# Patient Record
Sex: Male | Born: 1935 | Race: Black or African American | Hispanic: No | Marital: Married | Smoking: Never smoker
Health system: Southern US, Community
[De-identification: ages and names within clinical notes are randomized; demographics above are authoritative.]

## PROBLEM LIST (undated history)

## (undated) DIAGNOSIS — N529 Male erectile dysfunction, unspecified: Secondary | ICD-10-CM

## (undated) DIAGNOSIS — K649 Unspecified hemorrhoids: Secondary | ICD-10-CM

## (undated) DIAGNOSIS — R300 Dysuria: Secondary | ICD-10-CM

## (undated) DIAGNOSIS — R9431 Abnormal electrocardiogram [ECG] [EKG]: Secondary | ICD-10-CM

## (undated) DIAGNOSIS — L989 Disorder of the skin and subcutaneous tissue, unspecified: Secondary | ICD-10-CM

## (undated) DIAGNOSIS — K259 Gastric ulcer, unspecified as acute or chronic, without hemorrhage or perforation: Secondary | ICD-10-CM

## (undated) DIAGNOSIS — C61 Malignant neoplasm of prostate: Secondary | ICD-10-CM

## (undated) DIAGNOSIS — N419 Inflammatory disease of prostate, unspecified: Secondary | ICD-10-CM

## (undated) DIAGNOSIS — E049 Nontoxic goiter, unspecified: Secondary | ICD-10-CM

## (undated) DIAGNOSIS — N400 Enlarged prostate without lower urinary tract symptoms: Secondary | ICD-10-CM

## (undated) DIAGNOSIS — R208 Other disturbances of skin sensation: Secondary | ICD-10-CM

## (undated) DIAGNOSIS — R972 Elevated prostate specific antigen [PSA]: Secondary | ICD-10-CM

## (undated) DIAGNOSIS — K6289 Other specified diseases of anus and rectum: Secondary | ICD-10-CM

## (undated) HISTORY — DX: Other disturbances of skin sensation: R20.8

## (undated) HISTORY — DX: Gastric ulcer, unspecified as acute or chronic, without hemorrhage or perforation: K25.9

## (undated) HISTORY — DX: Disorder of the skin and subcutaneous tissue, unspecified: L98.9

## (undated) HISTORY — PX: THYROIDECTOMY: SHX17

## (undated) HISTORY — DX: Dysuria: R30.0

## (undated) HISTORY — DX: Elevated prostate specific antigen (PSA): R97.20

## (undated) HISTORY — DX: Abnormal electrocardiogram (ECG) (EKG): R94.31

## (undated) HISTORY — DX: Malignant neoplasm of prostate: C61

## (undated) HISTORY — DX: Other specified diseases of anus and rectum: K62.89

## (undated) HISTORY — DX: Unspecified hemorrhoids: K64.9

## (undated) HISTORY — DX: Benign prostatic hyperplasia without lower urinary tract symptoms: N40.0

## (undated) HISTORY — DX: Male erectile dysfunction, unspecified: N52.9

## (undated) HISTORY — DX: Inflammatory disease of prostate, unspecified: N41.9

## (undated) HISTORY — DX: Nontoxic goiter, unspecified: E04.9

---

## 2004-10-25 ENCOUNTER — Emergency Department: Payer: Self-pay | Admitting: General Practice

## 2005-05-20 ENCOUNTER — Emergency Department: Payer: Self-pay | Admitting: Emergency Medicine

## 2005-06-22 ENCOUNTER — Ambulatory Visit: Payer: Self-pay | Admitting: Unknown Physician Specialty

## 2005-06-22 ENCOUNTER — Other Ambulatory Visit: Payer: Self-pay

## 2005-06-25 ENCOUNTER — Ambulatory Visit: Payer: Self-pay | Admitting: Unknown Physician Specialty

## 2005-06-26 ENCOUNTER — Emergency Department: Payer: Self-pay | Admitting: Emergency Medicine

## 2005-06-28 ENCOUNTER — Emergency Department: Payer: Self-pay | Admitting: Emergency Medicine

## 2005-09-25 ENCOUNTER — Emergency Department: Payer: Self-pay | Admitting: Unknown Physician Specialty

## 2005-09-25 ENCOUNTER — Other Ambulatory Visit: Payer: Self-pay

## 2006-01-07 ENCOUNTER — Ambulatory Visit: Payer: Self-pay | Admitting: Internal Medicine

## 2006-01-14 ENCOUNTER — Ambulatory Visit: Payer: Self-pay | Admitting: Internal Medicine

## 2006-02-11 ENCOUNTER — Ambulatory Visit: Payer: Self-pay | Admitting: Urology

## 2006-02-23 ENCOUNTER — Ambulatory Visit: Payer: Self-pay | Admitting: Urology

## 2006-02-23 HISTORY — PX: OTHER SURGICAL HISTORY: SHX169

## 2006-02-27 ENCOUNTER — Emergency Department: Payer: Self-pay | Admitting: Emergency Medicine

## 2006-03-23 ENCOUNTER — Ambulatory Visit: Payer: Self-pay

## 2006-04-01 ENCOUNTER — Ambulatory Visit: Payer: Self-pay | Admitting: Unknown Physician Specialty

## 2006-04-12 ENCOUNTER — Ambulatory Visit: Payer: Self-pay | Admitting: Unknown Physician Specialty

## 2006-08-31 ENCOUNTER — Ambulatory Visit: Payer: Self-pay | Admitting: Gastroenterology

## 2008-01-27 IMAGING — US US THYROID
1 series · 17 of 25 positions shown · non-contrast
Comparison: none

REASON FOR EXAM: RT thyroid lobe larger than LT on thyroid uptake and scan
COMMENTS:

[Series 1: us thyroid · 17 of 36 slices shown]
[im 1/36]
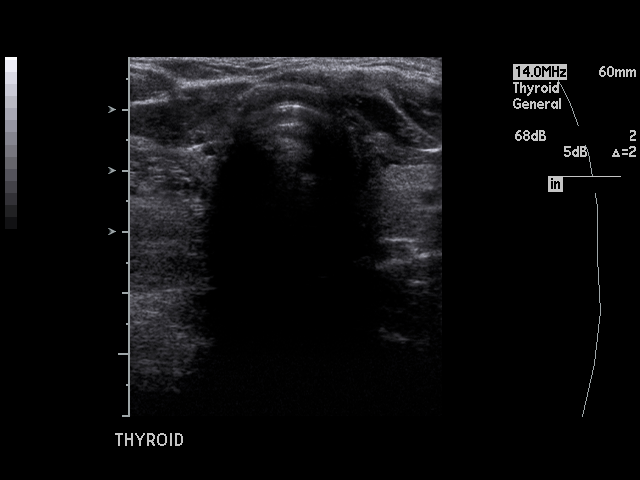
[im 3/36]
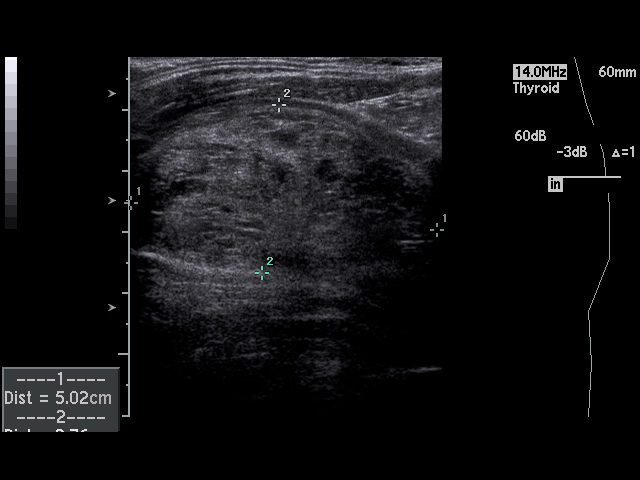
[im 5/36]
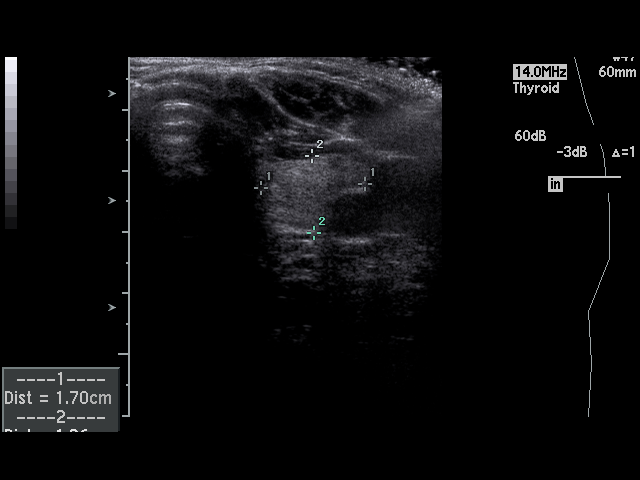
[im 8/36]
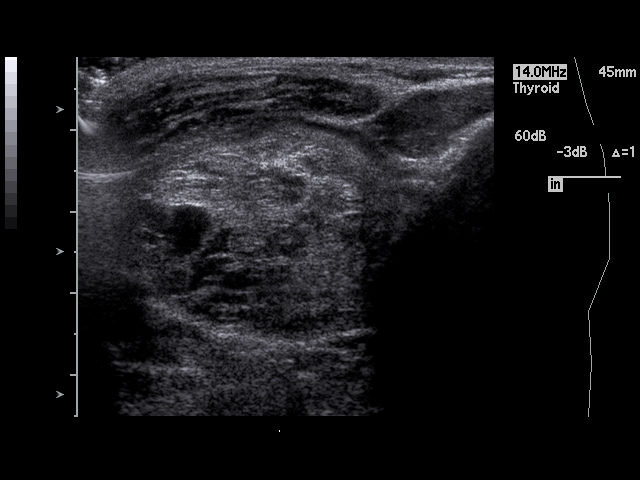
[im 9/36]
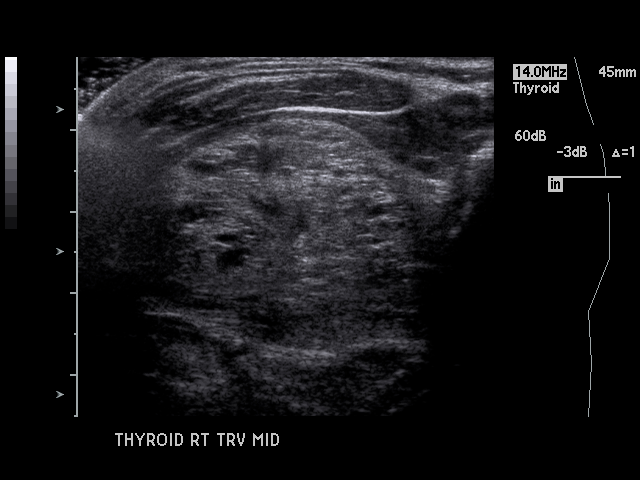
[im 12/36]
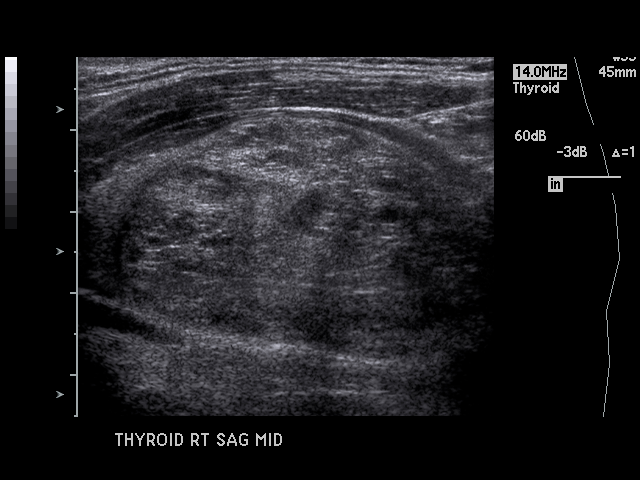
[im 14/36]
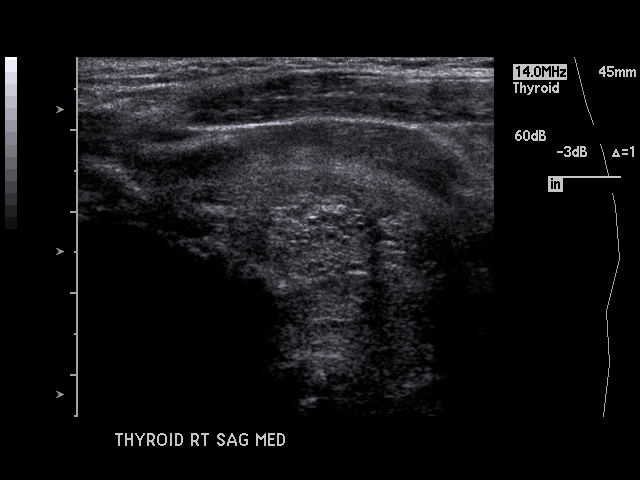
[im 17/36]
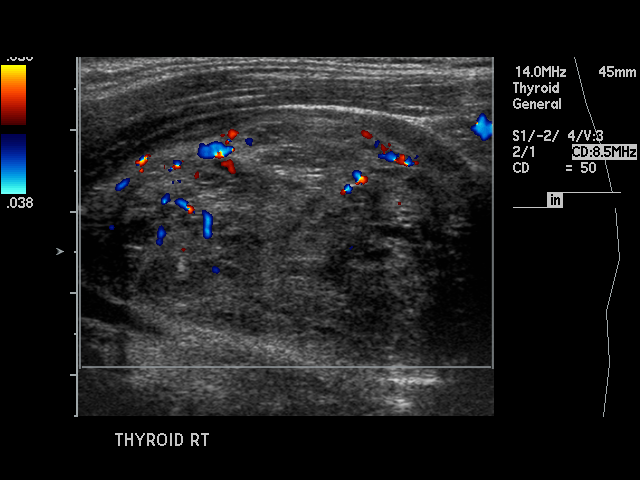
[im 18/36]
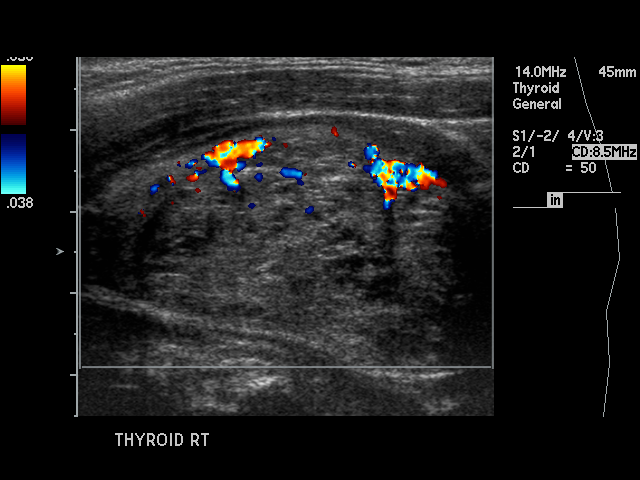
[im 19/36]
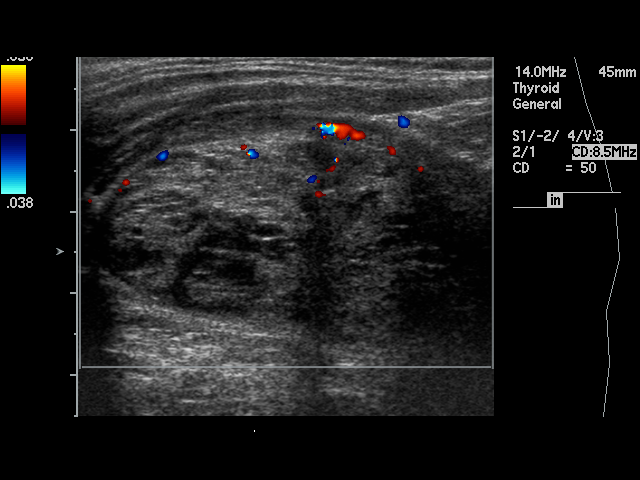
[im 22/36]
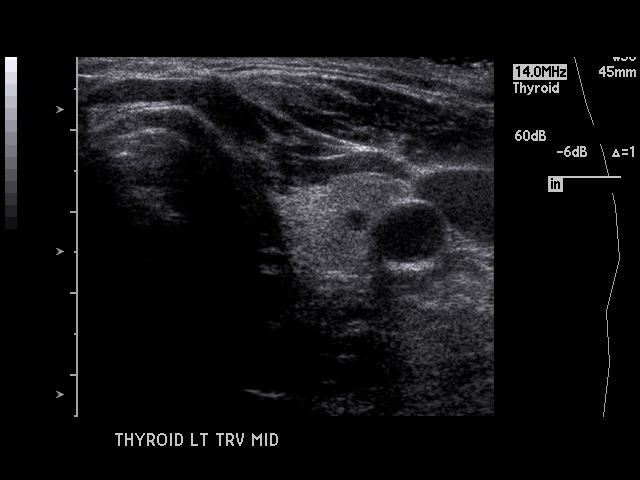
[im 24/36]
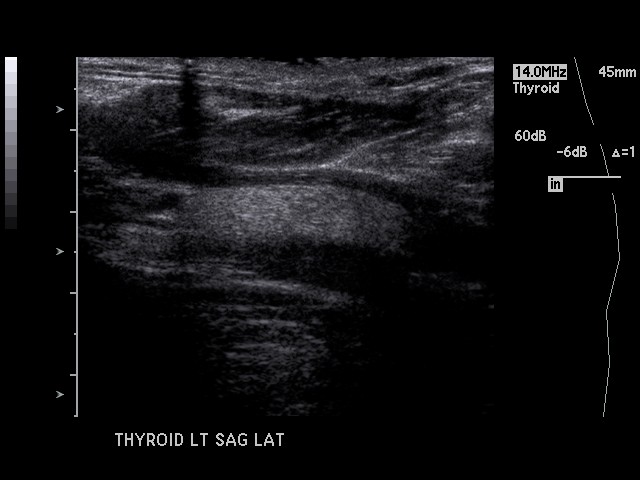
[im 27/36]
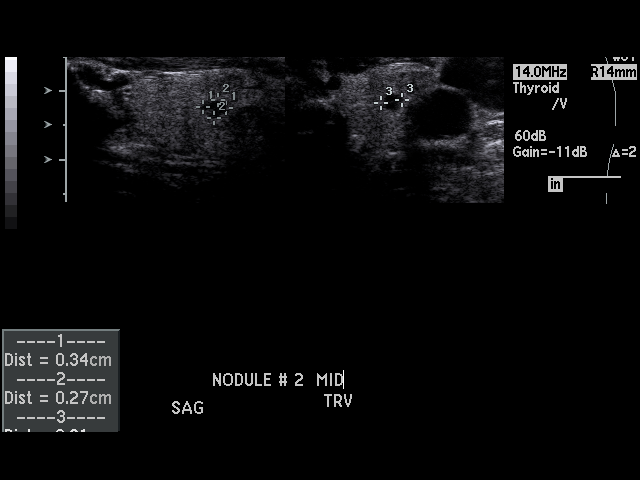
[im 28/36]
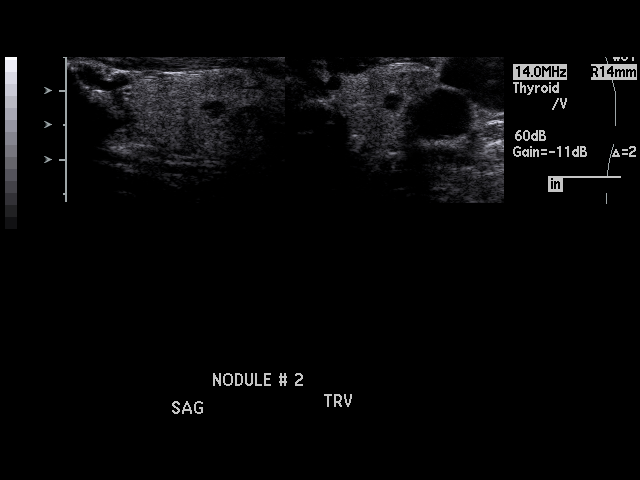
[im 31/36]
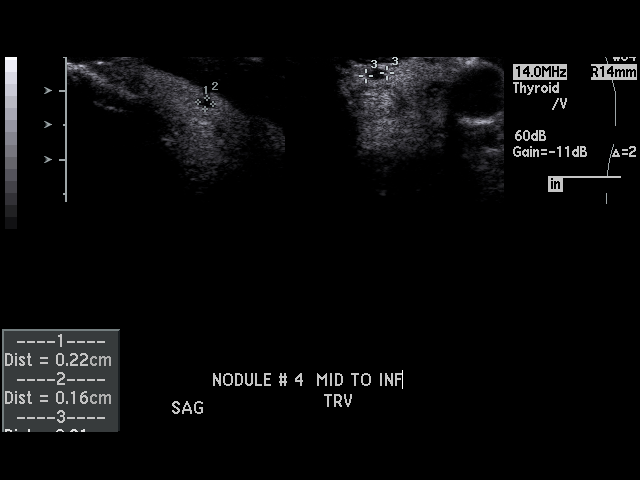
[im 33/36]
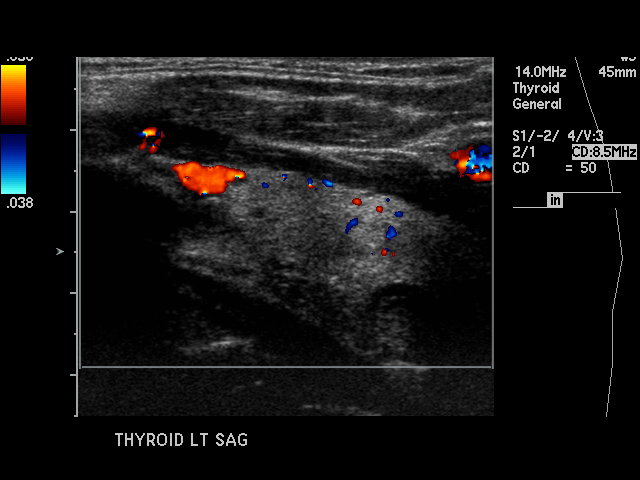
[im 36/36]
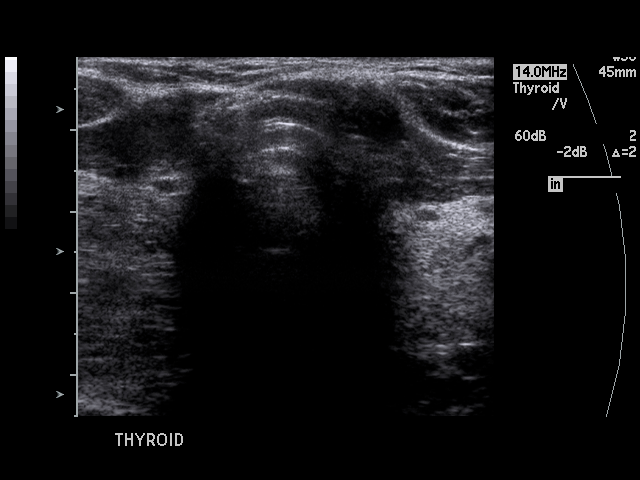

[17 of 25 positions shown; findings below may reference images not displayed]

PROCEDURE:     US  - US THYROID  - January 14, 2006  [DATE]

RESULT:          Sonographic evaluation of the thyroid gland shows a large
area of heterogeneity involving the RIGHT lobe which measures approximately
4.7 x 2.7 x 3.3 cm.  Multiple nodular areas are seen on the LEFT, with a mid
cystic nodule measuring 3.4 x 2.7 x 3.1 mm, and a mid to inferior complex
area measuring 4.1 x 3.5 x 3.4 mm, and a cystic mid to inferior lesion
measuring 2.2 x 1.6 x 3.1 cm.  The RIGHT lobe measures 5.0 x 2.7 x 3.6 cm,
and the LEFT lobe measures 4.3 x 1.3 x 1.7 cm.  The patient has already
undergone nuclear medicine scan.  According to the report, none of these
areas appear to be photopenic.  Tissue differentiation is difficult purely
sonographically.  Continued close correlation would be recommended.  If the
area continues to enlarge, consideration for biopsy would be recommended.
IMPRESSION: Please see above.

## 2008-02-16 ENCOUNTER — Ambulatory Visit: Payer: Self-pay | Admitting: Unknown Physician Specialty

## 2008-03-07 ENCOUNTER — Ambulatory Visit: Payer: Self-pay | Admitting: Gastroenterology

## 2008-08-07 ENCOUNTER — Ambulatory Visit: Payer: Self-pay | Admitting: Unknown Physician Specialty

## 2009-01-14 ENCOUNTER — Ambulatory Visit: Payer: Self-pay

## 2009-04-01 ENCOUNTER — Emergency Department: Payer: Self-pay | Admitting: Emergency Medicine

## 2010-01-17 ENCOUNTER — Ambulatory Visit: Payer: Self-pay | Admitting: Family Medicine

## 2010-08-14 ENCOUNTER — Ambulatory Visit: Payer: Self-pay | Admitting: Unknown Physician Specialty

## 2011-10-23 ENCOUNTER — Emergency Department: Payer: Self-pay | Admitting: Emergency Medicine

## 2012-03-02 ENCOUNTER — Ambulatory Visit: Payer: Self-pay | Admitting: Unknown Physician Specialty

## 2012-03-07 ENCOUNTER — Ambulatory Visit: Payer: Self-pay | Admitting: Gastroenterology

## 2012-09-05 ENCOUNTER — Ambulatory Visit: Payer: Self-pay | Admitting: Unknown Physician Specialty

## 2012-09-16 ENCOUNTER — Ambulatory Visit: Payer: Self-pay | Admitting: Unknown Physician Specialty

## 2013-05-05 ENCOUNTER — Inpatient Hospital Stay: Payer: Self-pay | Admitting: Internal Medicine

## 2013-05-05 LAB — TSH: Thyroid Stimulating Horm: 1.26 u[IU]/mL

## 2013-05-05 LAB — DRUG SCREEN, URINE
Amphetamines, Ur Screen: NEGATIVE (ref ?–1000)
Barbiturates, Ur Screen: NEGATIVE (ref ?–200)
Benzodiazepine, Ur Scrn: NEGATIVE (ref ?–200)
Cocaine Metabolite,Ur ~~LOC~~: NEGATIVE (ref ?–300)
MDMA (Ecstasy)Ur Screen: NEGATIVE (ref ?–500)
Opiate, Ur Screen: POSITIVE (ref ?–300)
Tricyclic, Ur Screen: NEGATIVE (ref ?–1000)

## 2013-05-05 LAB — CBC WITH DIFFERENTIAL/PLATELET
Basophil #: 0 10*3/uL (ref 0.0–0.1)
Basophil %: 0.2 %
Eosinophil %: 0.2 %
HCT: 47.4 % (ref 40.0–52.0)
HGB: 15.3 g/dL (ref 13.0–18.0)
Lymphocyte #: 1.7 10*3/uL (ref 1.0–3.6)
Monocyte %: 5.6 %
Neutrophil %: 81.3 %
RDW: 14.9 % — ABNORMAL HIGH (ref 11.5–14.5)
WBC: 13.3 10*3/uL — ABNORMAL HIGH (ref 3.8–10.6)

## 2013-05-05 LAB — COMPREHENSIVE METABOLIC PANEL
Albumin: 4.2 g/dL (ref 3.4–5.0)
BUN: 14 mg/dL (ref 7–18)
Bilirubin,Total: 0.5 mg/dL (ref 0.2–1.0)
Calcium, Total: 9.5 mg/dL (ref 8.5–10.1)
Chloride: 103 mmol/L (ref 98–107)
Creatinine: 1.28 mg/dL (ref 0.60–1.30)
Potassium: 3.3 mmol/L — ABNORMAL LOW (ref 3.5–5.1)
SGOT(AST): 23 U/L (ref 15–37)
SGPT (ALT): 39 U/L (ref 12–78)
Sodium: 141 mmol/L (ref 136–145)
Total Protein: 7.5 g/dL (ref 6.4–8.2)

## 2013-05-05 LAB — URINALYSIS, COMPLETE
Bacteria: NONE SEEN
Blood: NEGATIVE
Glucose,UR: 150 mg/dL (ref 0–75)
Nitrite: NEGATIVE
Ph: 6 (ref 4.5–8.0)
Protein: NEGATIVE
Squamous Epithelial: NONE SEEN

## 2013-05-05 LAB — TROPONIN I: Troponin-I: <0.02 ng/mL

## 2013-05-05 LAB — PRO B NATRIURETIC PEPTIDE: B-Type Natriuretic Peptide: 193 pg/mL (ref 0–450)

## 2013-05-06 LAB — CBC WITH DIFFERENTIAL/PLATELET
Eosinophil #: 0 10*3/uL (ref 0.0–0.7)
HCT: 43.6 % (ref 40.0–52.0)
HGB: 14.5 g/dL (ref 13.0–18.0)
Lymphocyte #: 0.9 10*3/uL — ABNORMAL LOW (ref 1.0–3.6)
MCH: 26.3 pg (ref 26.0–34.0)
MCHC: 33.3 g/dL (ref 32.0–36.0)
MCV: 79 fL — ABNORMAL LOW (ref 80–100)
Monocyte #: 0.4 x10 3/mm (ref 0.2–1.0)
Neutrophil #: 9.2 10*3/uL — ABNORMAL HIGH (ref 1.4–6.5)
Neutrophil %: 88.2 %
Platelet: 158 10*3/uL (ref 150–440)
RDW: 14.7 % — ABNORMAL HIGH (ref 11.5–14.5)
WBC: 10.4 10*3/uL (ref 3.8–10.6)

## 2013-05-06 LAB — BASIC METABOLIC PANEL
Anion Gap: 9 (ref 7–16)
Calcium, Total: 9 mg/dL (ref 8.5–10.1)
Chloride: 104 mmol/L (ref 98–107)
Co2: 28 mmol/L (ref 21–32)
Creatinine: 1.2 mg/dL (ref 0.60–1.30)
EGFR (African American): >60
Glucose: 139 mg/dL — ABNORMAL HIGH (ref 65–99)
Osmolality: 285 (ref 275–301)

## 2013-05-07 LAB — BASIC METABOLIC PANEL
BUN: 17 mg/dL (ref 7–18)
Calcium, Total: 8.3 mg/dL — ABNORMAL LOW (ref 8.5–10.1)
Co2: 31 mmol/L (ref 21–32)
Creatinine: 1.21 mg/dL (ref 0.60–1.30)
EGFR (Non-African Amer.): 56 — ABNORMAL LOW
Osmolality: 288 (ref 275–301)
Potassium: 3.8 mmol/L (ref 3.5–5.1)

## 2013-05-07 LAB — CBC WITH DIFFERENTIAL/PLATELET
Basophil #: 0 10*3/uL (ref 0.0–0.1)
Basophil %: 0.4 %
Eosinophil %: 1.2 %
Lymphocyte #: 1.7 10*3/uL (ref 1.0–3.6)
MCH: 26.8 pg (ref 26.0–34.0)
MCHC: 33.7 g/dL (ref 32.0–36.0)
MCV: 80 fL (ref 80–100)
Monocyte #: 0.5 x10 3/mm (ref 0.2–1.0)
RBC: 5.22 10*6/uL (ref 4.40–5.90)

## 2013-07-24 ENCOUNTER — Ambulatory Visit: Payer: Self-pay | Admitting: Unknown Physician Specialty

## 2013-11-04 IMAGING — CR DG CHEST 2V
1 series · 2 of 2 positions shown · non-contrast
Comparison: none

REASON FOR EXAM: coughing up blood
COMMENTS:   May transport without cardiac monitor

PROCEDURE:     DXR - DXR CHEST PA (OR AP) AND LATERAL  - October 23, 2011  [DATE]
RESULT:     The lung fields are clear. The heart, mediastinal and osseous
structures reveal no significant abnormalities.

[Series 1: w chest pa · 0.14mm/px · 2 of 2 slices shown]
[im 1/2]
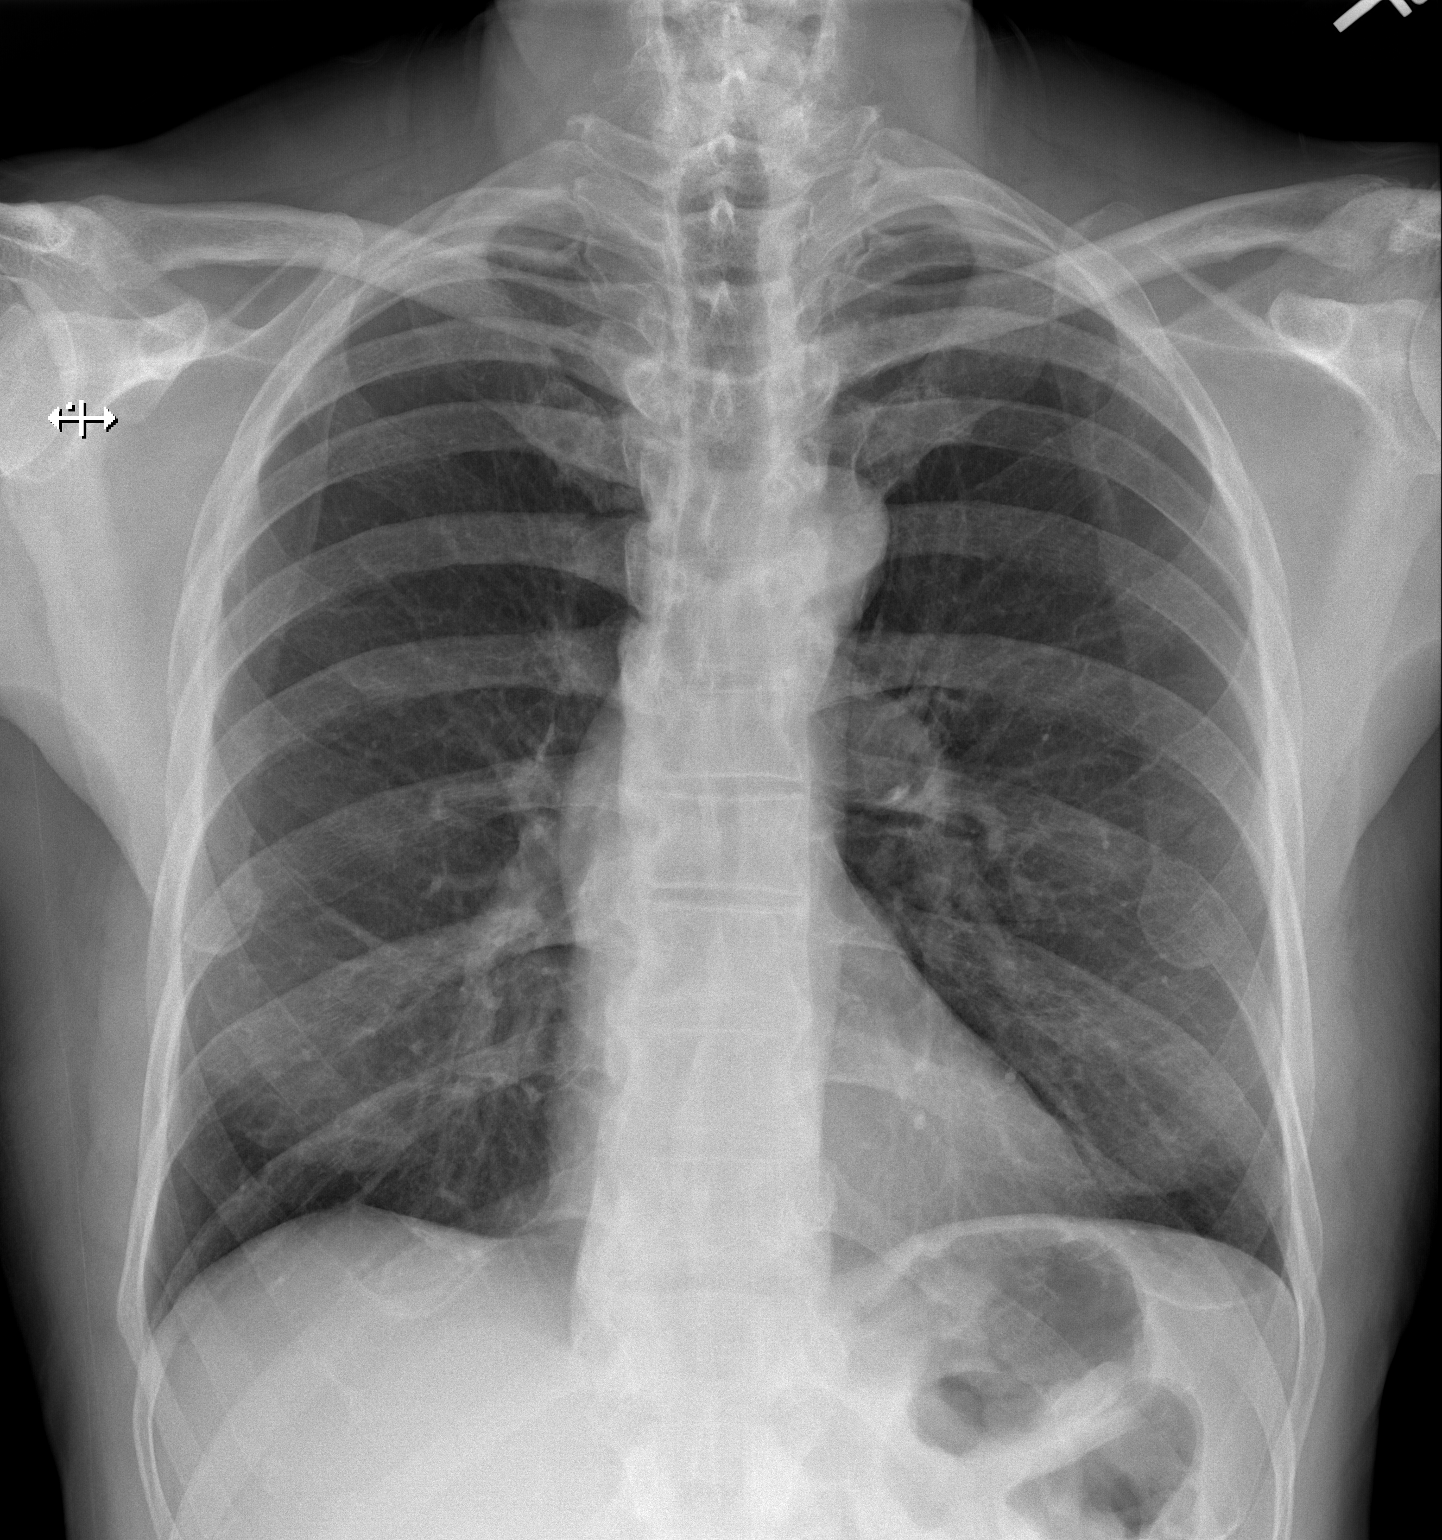
[im 2/2]
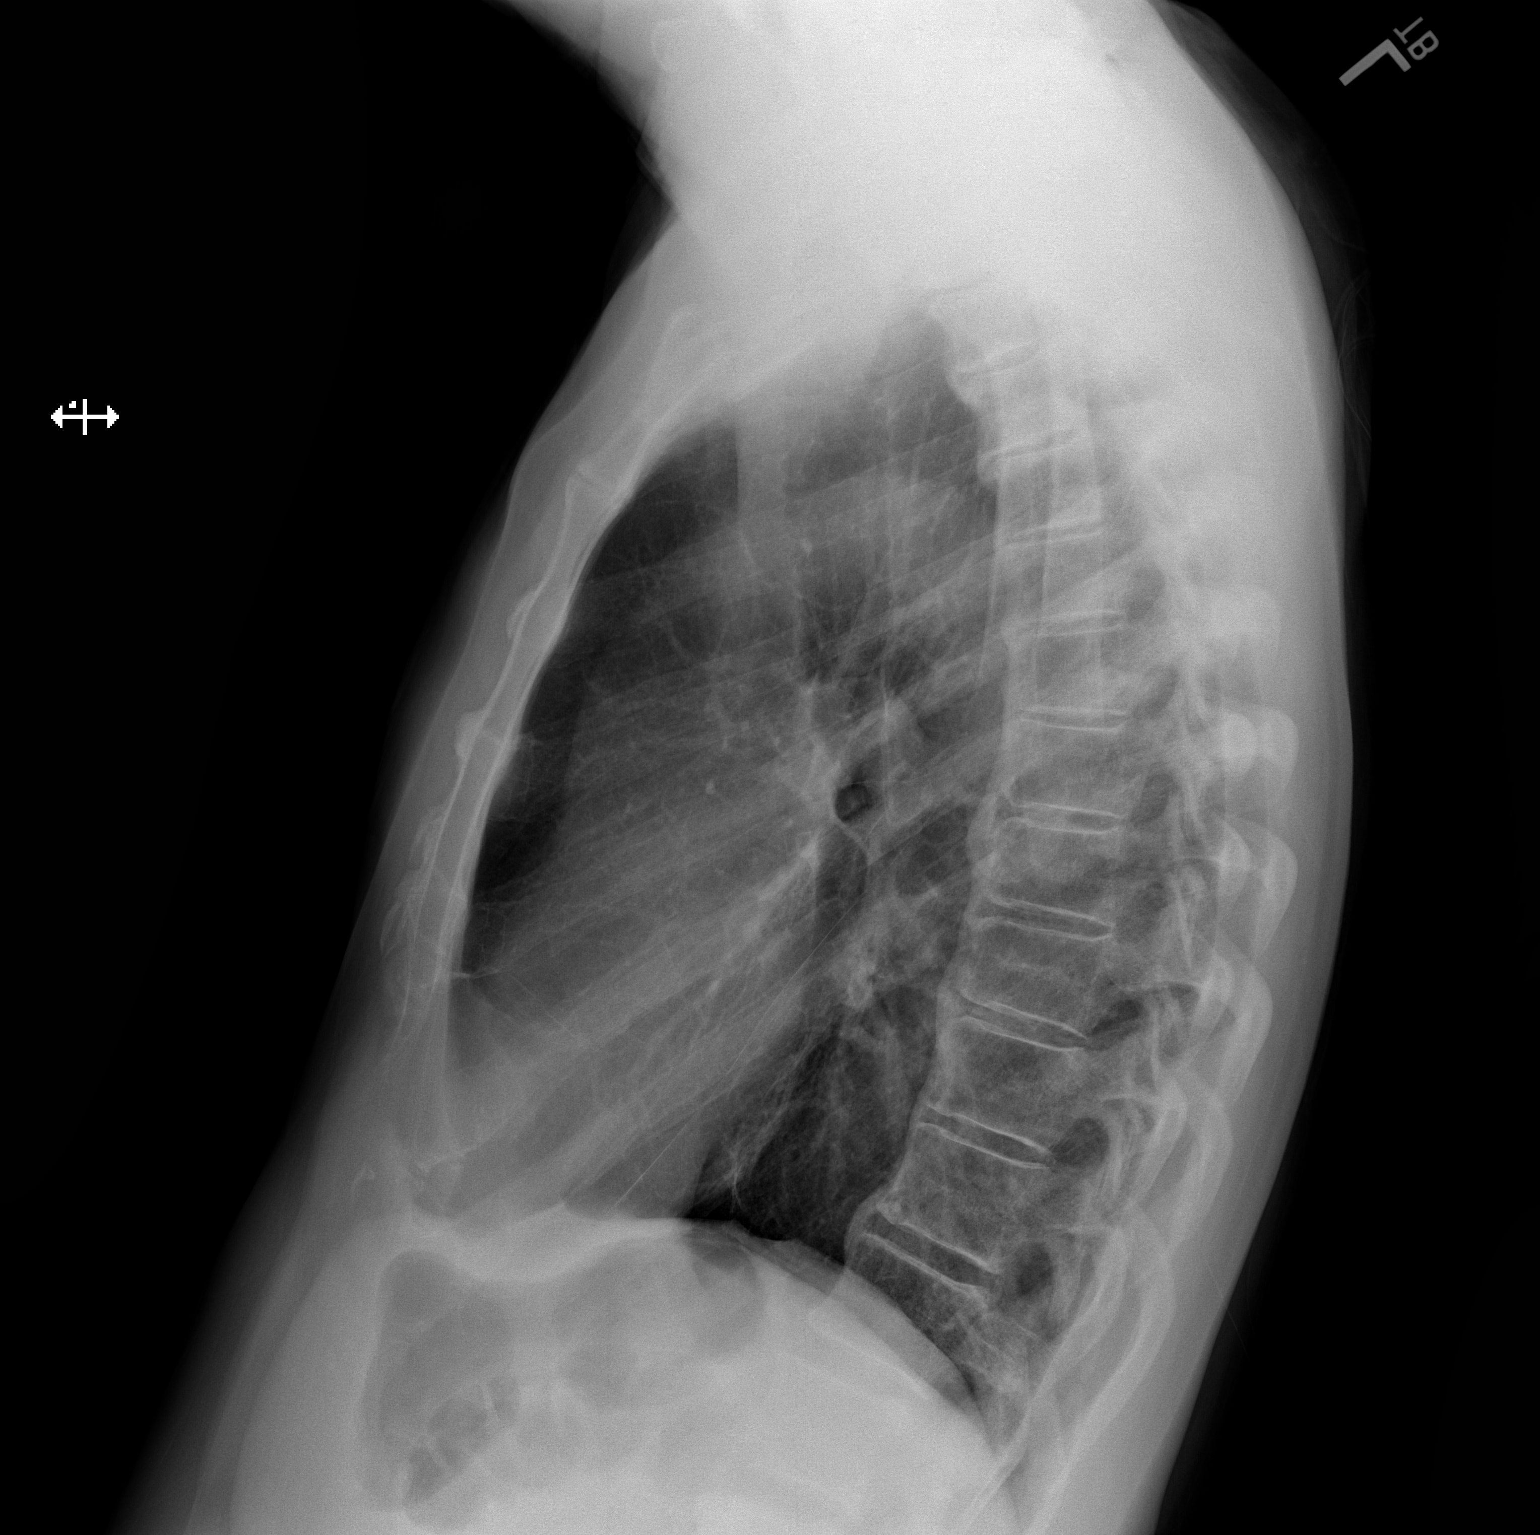

[2 of 2 positions shown; findings below may reference images not displayed]

IMPRESSION: No significant abnormalities are noted.

## 2014-04-25 DIAGNOSIS — I10 Essential (primary) hypertension: Secondary | ICD-10-CM | POA: Insufficient documentation

## 2014-08-21 DIAGNOSIS — Z8546 Personal history of malignant neoplasm of prostate: Secondary | ICD-10-CM | POA: Insufficient documentation

## 2014-08-21 DIAGNOSIS — Z8639 Personal history of other endocrine, nutritional and metabolic disease: Secondary | ICD-10-CM | POA: Insufficient documentation

## 2014-08-21 DIAGNOSIS — G629 Polyneuropathy, unspecified: Secondary | ICD-10-CM | POA: Insufficient documentation

## 2014-09-18 IMAGING — US THYROID ULTRASOUND
1 series · 13 of 25 positions shown · non-contrast
Comparison: none

REASON FOR EXAM: thyroid nodule compare to previous US
COMMENTS:

PROCEDURE:     SADE - SADE SOFT TISSUE HEAD/NECK/THYROI  - September 05, 2012 [DATE]
RESULT:     Comparison is made to prior study dated 03/02/2012.

[Series 1: thyroid ultrasound · 0.08mm/px · 13 of 64 slices shown]
[im 1/64]
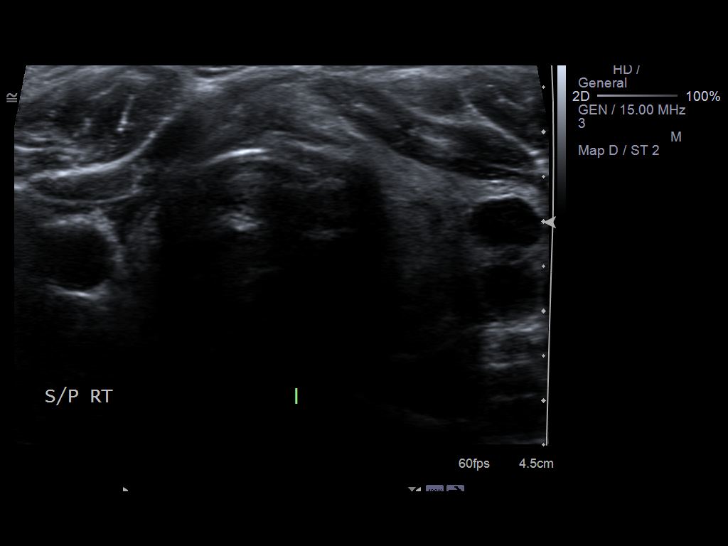
[im 6/64]
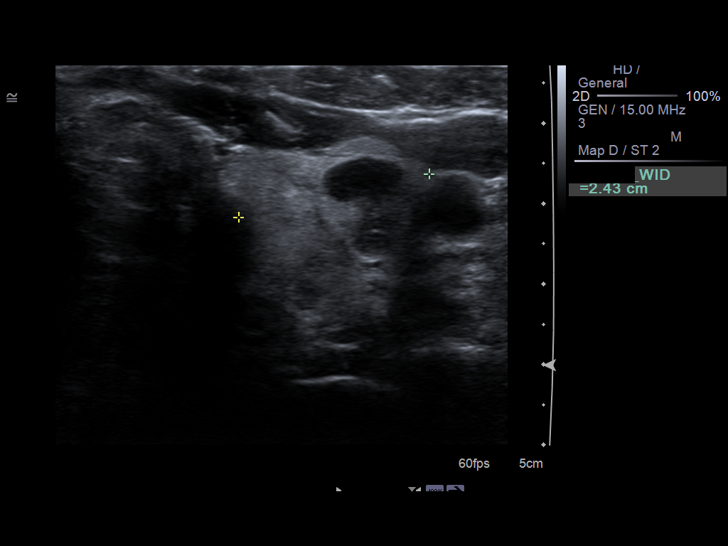
[im 11/64]
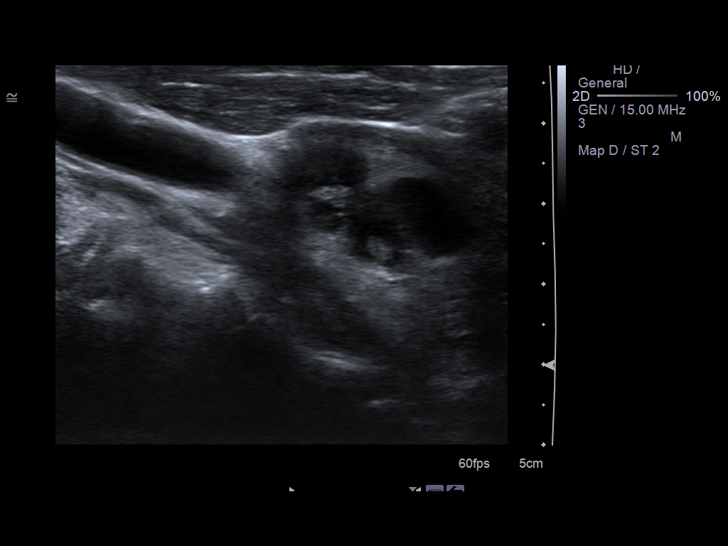
[im 16/64]
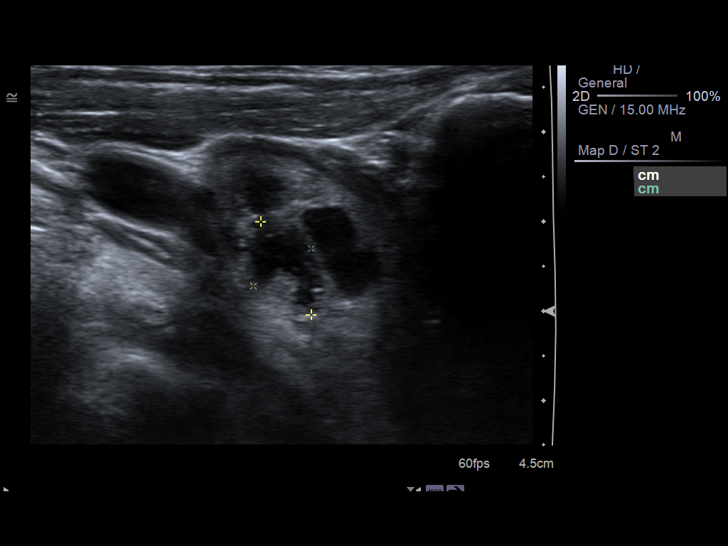
[im 22/64]
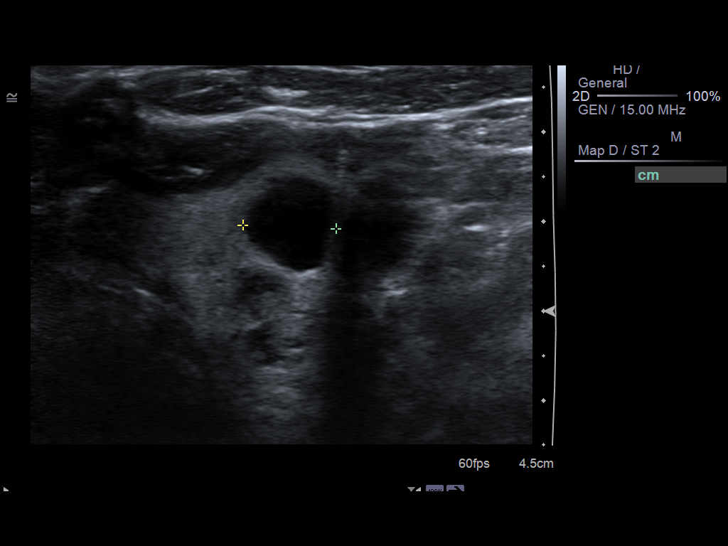
[im 27/64]
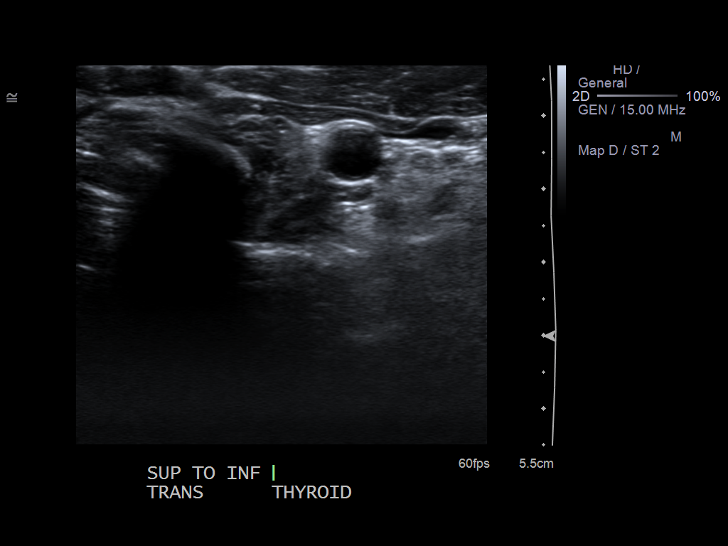
[im 32/64]
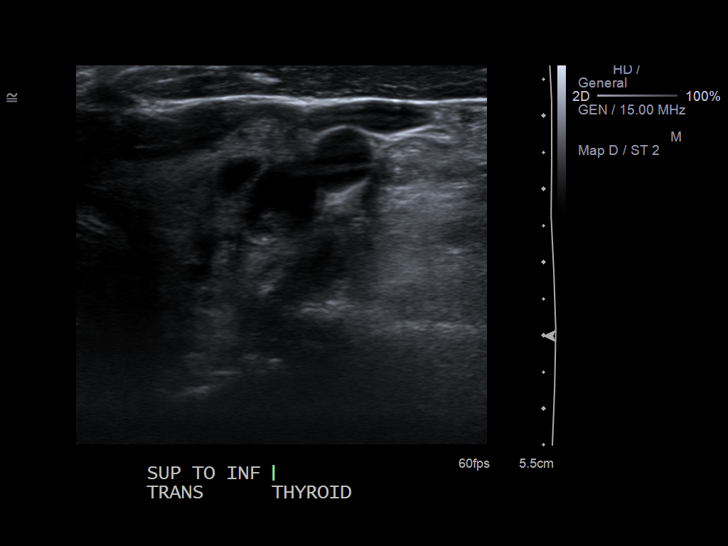
[im 37/64]
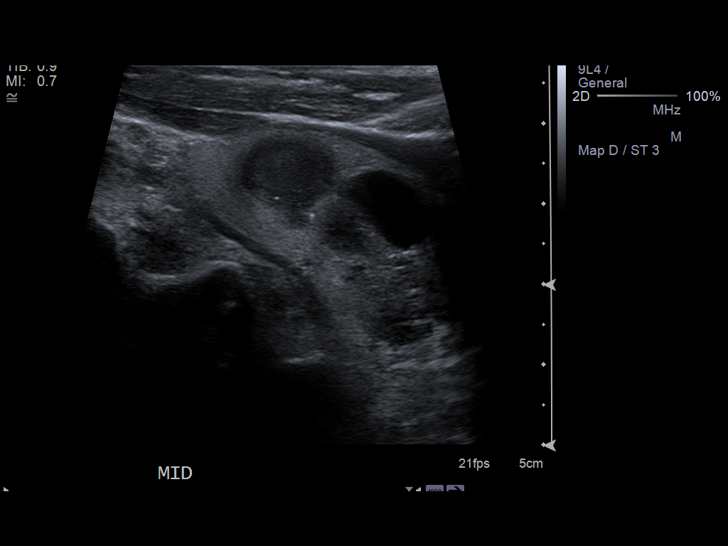
[im 43/64]
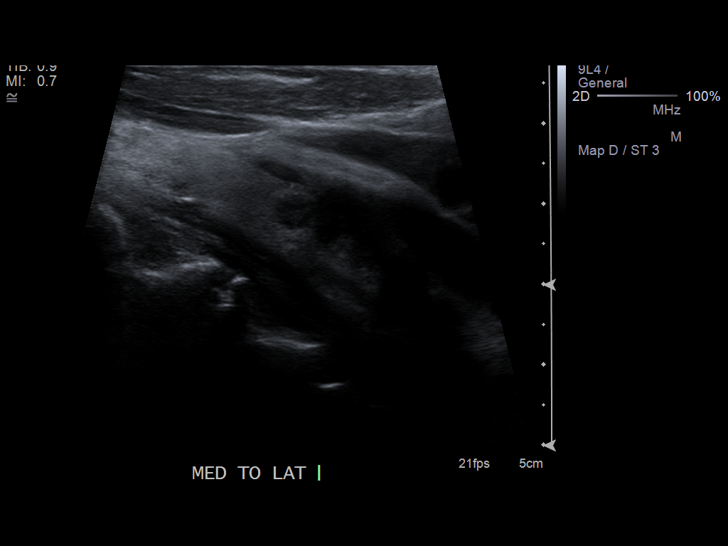
[im 48/64]
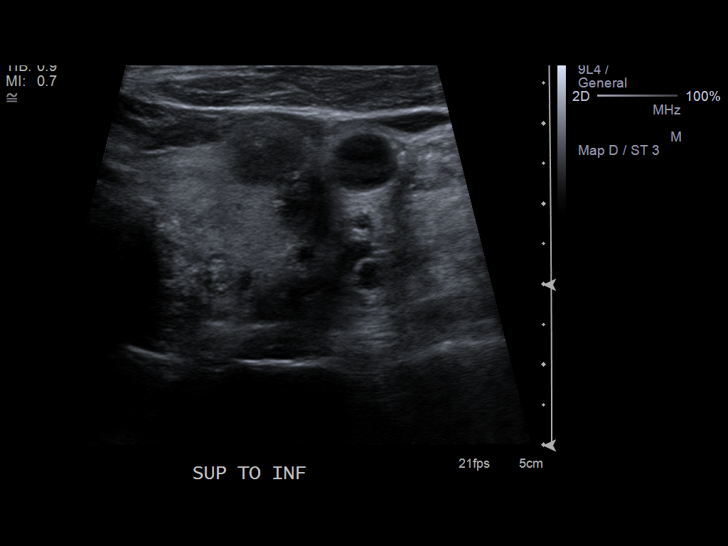
[im 53/64]
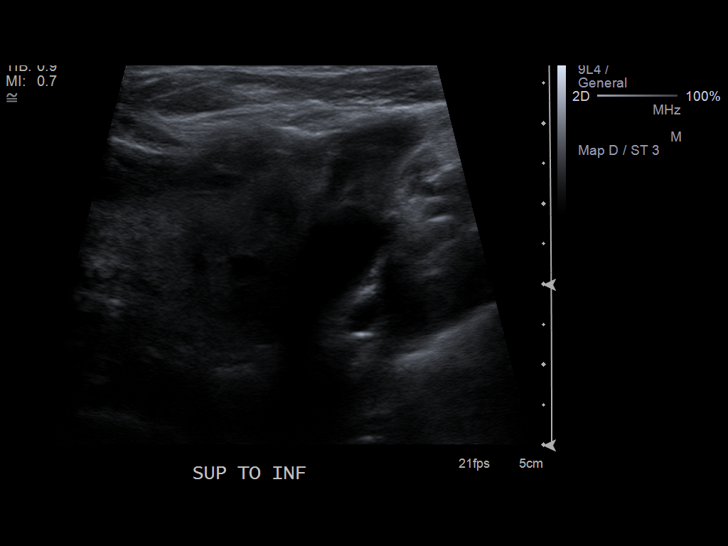
[im 58/64]
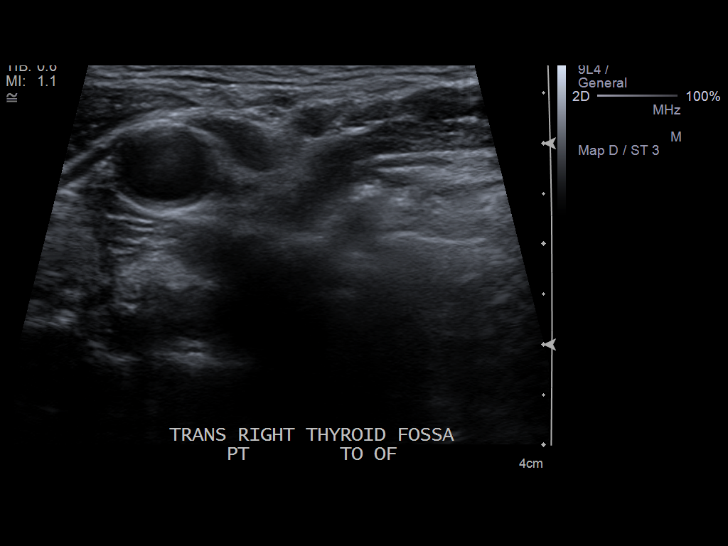
[im 64/64]
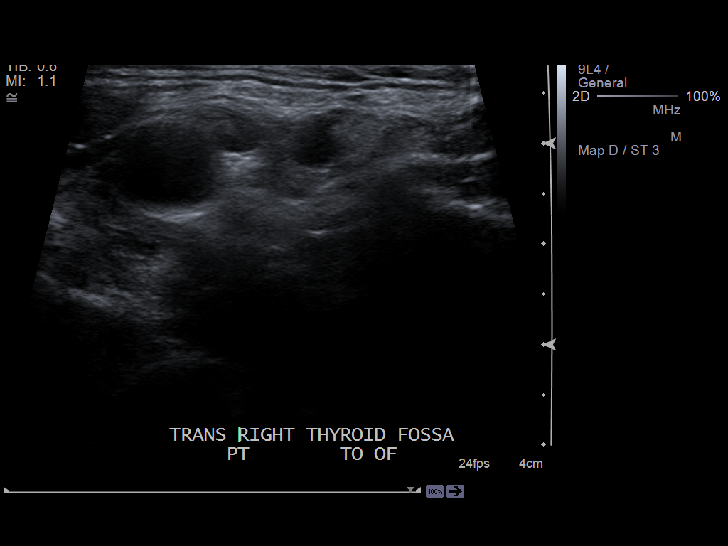

[13 of 25 positions shown; findings below may reference images not displayed]

FINDINGS: The right lobe of the thyroid is surgically absent. The isthmus
measures 4.1 mm in thickness and demonstrates a homogeneous echotexture.

Multiple nodules are appreciated within the left lobe of the thyroid. When
compared to the previous study there has been an increased number of
nodules. A debris-filled nodule is identified within the anterior aspect of
left lobe labeled #1 measuring 1.32 x 1.05 x 1.14 cm. Previously, this
nodule was primarily cystic. The nodule on the present examination is
heterogeneous and contains punctate calcifications. A second nodule is
identified just posterior and appears unchanged when compared to the
previous study and appears to be primarily cystic measuring 1.18 x 0.77 x
0.95 cm. The third nodule labeled #3 appears solid and measures 1.36 x
x 0.93 cm and is in the medial aspect of the left lobe of the thyroid. This
nodule appears to have increased in size when compared to the previous
study. Within the lower pole of left lobe of the thyroid there appears be a
nodule labeled
#4, which is primarily cystic measuring 1.34 x 0.95 x 1.05 cm. When
correlated with the previous study this nodule appears to have decreased in
size.
IMPRESSION: 1.     Nodule labeled #3 appears to have increased in size when compared to
the previous study. The nodule labeled #1 has developed a more solid
appearance and contains small punctate calculi. These findings are
indeterminate. Surveillance evaluation recommended if and as clinically
warranted or possibly tissue sampling.
2.     Nodule labeled #2 appears stable.

## 2015-02-20 ENCOUNTER — Ambulatory Visit
Admit: 2015-02-20 | Disposition: A | Discharge status: Home / Self Care | Payer: Self-pay | Attending: Unknown Physician Specialty | Admitting: Unknown Physician Specialty

## 2015-02-22 NOTE — H&P (Signed)
PATIENT NAME:  Ian Parker, Ian Parker MR#:  [id number] DATE OF BIRTH:  Apr 30, 1931  DATE OF ADMISSION:  05/05/2013  PRIMARY CARE PHYSICIAN:  Dr. Gilford Rile.   CHIEF COMPLAINT:  Confusion.   HISTORY OF PRESENTING ILLNESS:  An 81 year old African American male patient with history of internal hemorrhoids, hypertension, and BPH, who recently had hemorrhoid surgery 2 days back and was given a prescription of Vicodin, presents to the hospital, brought in by the wife with confusion. The patient and his family were out at a cookout. The patient went home to get some forgotten stuff and did not return. The patient's wife called, and then when she went home, the patient was sitting down, confused, and drowsy. The patient in the ER looked extremely sick, hypothermic at 95.6 with elevated white count of 13,000, needing a bear hugger, and is being admitted to the hospitalist service. The patient presently is a little more awake, although not being able to give much history. He does not complain of any pain. Most of the history obtained from old records with the ER physician, Dr. Cinda Quest, nursing staff, and family at bedside.   PAST MEDICAL HISTORY:  Hypertension, benign prostatic hypertrophy, internal hemorrhoids.   PAST SURGICAL HISTORY:  Hemorrhoid surgery.   SOCIAL HISTORY:  The patient lives at home, does not smoke, no alcohol, no illicit drugs. Drives and is independent with activities of daily living.   CODE STATUS:  FULL CODE.   FAMILY HISTORY:  Reviewed, but is unknown, as the patient is unable to answer questions.   HOME MEDICATIONS:  Include: 1.  Flomax 0.4 mg oral once a day.  2.  Vicodin 5/325 two tablets orally every 4 hours as needed for pain.  3.  Hydrochlorothiazide triamterene 50/75 one tablet oral once a day.  4.  Potassium chloride 20 mEq oral once a day.  5.  Amlodipine 5 mg oral once a day.   ALLERGIES:  ACE INHIBITORS, CIPROFLOXACIN, SEPTRA, and SULFA DRUGS and PEANUTS.   REVIEW OF SYSTEMS:   Unobtainable secondary to the patient's encephalopathy.   PHYSICAL EXAMINATION:  VITAL SIGNS:  Temperature of 95.8, and being on a bear hugger, presently is at 97, pulse of 87, respirations 18, blood pressure 121/76, saturating 99% on room air.  GENERAL:  Moderately built [place] American male patient lying in bed confused.  PSYCHIATRIC:  Alert, awake, but confused.  HEENT:  Atraumatic, normocephalic. Oral mucosa dry and pink. External ears and nose normal. No pallor. No icterus. Pupils bilaterally equal and reactive to light.  NECK: Supple. No thyromegaly. No palpable lymph nodes. Trachea midline. No carotid bruit or JVD.  CARDIOVASCULAR:  S1, S2, without any murmurs. Peripheral pulses 2+. No edema.  RESPIRATORY:  Normal work of breathing. Clear to auscultation on both sides.  GASTROINTESTINAL:  Soft abdomen, nontender. Bowel sounds present. No hepatosplenomegaly palpable.  SKIN:  Warm and dry. No petechiae, rash, ulcers.  MUSCULOSKELETAL:  No joint swelling, redness, effusion of the large joints. Normal muscle tone.  NEUROLOGICAL:  Motor strength, moves all 4 extremities symmetrically. Withdraws to pain. Babinski is downgoing.  RECTAL:  Exam deferred, but externally no signs of erythema, swelling, infection.   LABORATORY STUDIES:   1.  BNP of 193, glucose 235, BUN 14, creatinine 1.28, sodium 141, potassium 3.3, chloride 103, GFR 53. AST, ALT, alkaline phosphatase, bilirubin normal. Troponin less than 0.02. TSH of 1.26. Urine drug screen shows opiates positive. CBC shows WBC 13.3, hemoglobin 15.3, platelets of 164, neutrophils 81%. Urinalysis shows no bacteria.  2.  EKG shows sinus bradycardia at 59. No acute ST-T wave changes. 3.  Portable chest x-ray shows no acute disease.  4.  CT scan of the abdomen and pelvis with contrast shows a rectal thermometer. No acute findings in the abdomen or pelvis. There is a small defined focus of hyper enhancement in the right hepatic lobe, which likely  represents a perfusion anomaly similar to prior. A 7-mm calculus is seen in the left kidney.   ASSESSMENT AND PLAN:  1.  Sepsis with elevated white count and low body temperature. Unknown source. We will start the patient broadly with vancomycin and Zosyn. The patient had recent hemorrhoid surgery, could have had seeding from the site. We will get blood cultures and continue the antibiotic support with IV fluids. No other source for infection found.  2.  Acute encephalopathy could be secondary to sepsis or the new Vicodin started. We will also check a CT scan of the head to rule out any acute abnormalities.  3.  Hypertension. Continue medications.  4.  Hemorrhoids. Hemoglobin is stable, had recent surgery.  5.  Deep vein thrombosis prophylaxis with Lovenox.  6.  Code status: FULL CODE.   Time spent today on this critically ill case with hypothermia needing a bear hugger was 45 minutes.   ____________________________ Leia Alf Angelette Ganus, MD srs:ms D: 05/05/2013 21:17:21 ET T: 05/05/2013 21:42:05 ET JOB#: [id number]  cc: Alveta Heimlich R. Stephan Nelis, MD, <Dictator> John B. Sarina Ser, MD Alveta Heimlich Arlice Colt MD ELECTRONICALLY SIGNED 05/06/2013 13:27

## 2015-02-22 NOTE — Discharge Summary (Signed)
PATIENT NAME:  Ian Parker, Ian Parker MR#:  [id number] DATE OF BIRTH:  01-09-1931  DATE OF ADMISSION:  05/05/2013 DATE OF DISCHARGE:  05/07/2013  DIAGNOSES AT TIME OF DISCHARGE: 1.  Weakness and diaphoresis secondary to medications.  2.  Hypertension.  3.  History of benign prostatic hypertrophy.  4.  Recent hemorrhoid surgery.   CHIEF COMPLAINT: Confusion.   HISTORY OF PRESENT ILLNESS: Truitt Cruey is an 81 year old African American male with a history of recent hemorrhoid surgery, history of hypertension, benign prostatic hypertrophy, who was brought to the ER after he was noted by his wife to be confused and diaphoretic. The patient reportedly had gone to a cookout and subsequently had gone back home and when his wife called and went looking for him, the patient was reportedly confused, drowsy and sitting down. He was also noted to be hypotonic the ED with a temperature of 95.6, elevated white cell count of 13,000.   PAST MEDICAL HISTORY: Significant for hypertension, benign prostatic hypertrophy, internal hemorrhoids.   PHYSICAL EXAMINATION: VITAL SIGNS: Temperature 95.8, with bear hugger improved to 97, pulse 87, respirations 18, blood pressure 121/76, oxygen saturation 99% on room air.  GENERAL: He was somewhat confused initially but later improved.  HEENT: Normocephalic, atraumatic.  NECK: Supple.  HEART: S1, S2.  LUNGS: Clear to auscultation.  ABDOMEN: Soft, nontender.  EXTREMITIES: No edema.  NEUROLOGIC: Nonfocal.   LABORATORY, DIAGNOSTIC AND RADIOLOGIC DATA: BUN 14, creatinine 1.28, sodium 141, potassium 3.3, chloride 103. Liver function tests were normal. Troponin less than 0.02. TSH 1.26. Urine drug screen showed opiates positive. WBC count 13.3, hemoglobin 15.3, platelets 164, neutrophils 81.   EKG shows sinus bradycardia, no acute ST-T changes.   HOSPITAL COURSE: The patient was admitted to [facility] and was initially treated with IV antibiotics i.e. vancomycin and Zosyn. Blood  cultures were negative. His white cell count came back to normal. Chest x-ray did not show any infiltrate. Urinalysis was also normal. Symptomatically, the patient improved significantly. CT head did not show any acute intracranial findings. CT of the abdomen and pelvis showed no aggressive lytic or sclerotic osseous lesions. No acute findings were noted of the abdomen and pelvis. With intravenous hydration, the patient improved significantly. Stool cultures were also negative. Clostridium difficile was negative. His antibiotics were subsequently discontinued and he was allowed home in stable condition. He did have an episode of supraventricular tachycardia on the date of the discharge, but was asymptomatic. The patient was discharged in stable condition on the following medications:  Amlodipine 5 mg once a day, Klor-Con 20 mEq once a day, HCTZ/triamterene 50/75, 1 tablet a day, tamsulosin 0.4 mg once at bedtime.   DISCHARGE INSTRUCTIONS:  He has been advised to follow-up with Dr. Gilford Rile in 1 to 2 weeks' time. If he has any further episodes, he has been advised to call back or return to the ER. The patient was stable at the time of discharge.   TOTAL TIME SPENT: In discharge of this patient, 35 minutes   ____________________________ Tracie Harrier, MD vh:cc D: 05/07/2013 11:30:53 ET T: 05/07/2013 20:19:00 ET JOB#: [id number]  cc: Tracie Harrier, MD, <Dictator> Tracie Harrier MD ELECTRONICALLY SIGNED 05/25/2013 19:14

## 2015-03-13 ENCOUNTER — Encounter: Payer: Self-pay | Admitting: *Deleted

## 2015-03-14 ENCOUNTER — Ambulatory Visit: Payer: Medicare Other | Admitting: Anesthesiology

## 2015-03-14 ENCOUNTER — Encounter
Admission: RE | Disposition: A | Discharge status: Home / Self Care | Payer: Self-pay | Source: Ambulatory Visit | Attending: Gastroenterology

## 2015-03-14 ENCOUNTER — Ambulatory Visit
Admission: RE | Admit: 2015-03-14 | Discharge: 2015-03-14 | Disposition: A | Discharge status: Home / Self Care | Payer: Medicare Other | Source: Ambulatory Visit | Attending: Gastroenterology | Admitting: Gastroenterology

## 2015-03-14 ENCOUNTER — Encounter: Payer: Self-pay | Admitting: *Deleted

## 2015-03-14 DIAGNOSIS — R1310 Dysphagia, unspecified: Secondary | ICD-10-CM | POA: Diagnosis present

## 2015-03-14 DIAGNOSIS — K222 Esophageal obstruction: Secondary | ICD-10-CM | POA: Insufficient documentation

## 2015-03-14 DIAGNOSIS — Z8042 Family history of malignant neoplasm of prostate: Secondary | ICD-10-CM | POA: Insufficient documentation

## 2015-03-14 DIAGNOSIS — Z79899 Other long term (current) drug therapy: Secondary | ICD-10-CM | POA: Insufficient documentation

## 2015-03-14 DIAGNOSIS — Z8249 Family history of ischemic heart disease and other diseases of the circulatory system: Secondary | ICD-10-CM | POA: Insufficient documentation

## 2015-03-14 DIAGNOSIS — Z882 Allergy status to sulfonamides status: Secondary | ICD-10-CM | POA: Insufficient documentation

## 2015-03-14 HISTORY — PX: ESOPHAGOGASTRODUODENOSCOPY: SHX5428

## 2015-03-14 SURGERY — EGD (ESOPHAGOGASTRODUODENOSCOPY)
Anesthesia: Monitor Anesthesia Care

## 2015-03-14 MED ORDER — PROPOFOL INFUSION 10 MG/ML OPTIME
INTRAVENOUS | Status: DC | PRN
  Administered 2015-03-14: 200 ug/kg/min via INTRAVENOUS

## 2015-03-14 MED ORDER — LIDOCAINE HCL (PF) 1 % IJ SOLN
2.0000 mL | Freq: Once | INTRAMUSCULAR | Status: AC
  Administered 2015-03-14: 2 mL via INTRADERMAL

## 2015-03-14 MED ORDER — GLYCOPYRROLATE 0.2 MG/ML IJ SOLN
INTRAMUSCULAR | Status: DC | PRN
  Administered 2015-03-14: 0.2 mg via INTRAVENOUS

## 2015-03-14 MED ORDER — LIDOCAINE HCL (CARDIAC) 20 MG/ML IV SOLN
INTRAVENOUS | Status: DC | PRN
  Administered 2015-03-14: 60 mg via INTRAVENOUS

## 2015-03-14 MED ORDER — SODIUM CHLORIDE 0.9 % IV SOLN
INTRAVENOUS | Status: DC
Start: 2015-03-14 — End: 2015-03-14
  Administered 2015-03-14: 1000 mL via INTRAVENOUS

## 2015-03-14 MED ORDER — PROPOFOL 10 MG/ML IV BOLUS
INTRAVENOUS | Status: DC | PRN
  Administered 2015-03-14: 50 mg via INTRAVENOUS

## 2015-03-14 NOTE — Transfer of Care (Signed)
Immediate Anesthesia Transfer of Care Note  [name]  Procedure(s) Performed: Procedure(s): ESOPHAGOGASTRODUODENOSCOPY (EGD) (N/A)  Patient Location: Endoscopy Unit  Anesthesia Type:MAC  Level of Consciousness: sedated  Airway & Oxygen Therapy: Patient Spontanous Breathing and Patient connected to nasal cannula oxygen  Post-op Assessment: Report given to RN and Post -op Vital signs reviewed and stable  Post vital signs: stable  Last Vitals:  Filed Vitals:   03/14/15 1424  BP: 118/76  Pulse: 62  Temp: 36.4 C  Resp: 10    Complications: No apparent anesthesia complications

## 2015-03-14 NOTE — Anesthesia Preprocedure Evaluation (Addendum)
Anesthesia Evaluation  Patient identified by MRN, date of birth, ID band Patient awake    Reviewed: Allergy & Precautions, H&P , NPO status , Patient's Chart, lab work & pertinent test results, reviewed documented beta blocker date and time   Airway Mallampati: II  TM Distance: >3 FB Neck ROM: full    Dental no notable dental hx.    Pulmonary neg pulmonary ROS,  breath sounds clear to auscultation  Pulmonary exam normal       Cardiovascular Exercise Tolerance: Good hypertension, negative cardio ROS  Rhythm:regular Rate:Normal     Neuro/Psych negative neurological ROS  negative psych ROS   GI/Hepatic negative GI ROS, Neg liver ROS,   Endo/Other  negative endocrine ROS  Renal/GU negative Renal ROS  negative genitourinary   Musculoskeletal   Abdominal   Peds  Hematology negative hematology ROS (+)   Anesthesia Other Findings   Reproductive/Obstetrics negative OB ROS                            Anesthesia Physical Anesthesia Plan  ASA: II  Anesthesia Plan: MAC   Post-op Pain Management:    Induction:   Airway Management Planned:   Additional Equipment:   Intra-op Plan:   Post-operative Plan:   Informed Consent: I have reviewed the patients History and Physical, chart, labs and discussed the procedure including the risks, benefits and alternatives for the proposed anesthesia with the patient or authorized representative who has indicated his/her understanding and acceptance.   Dental Advisory Given and Dental advisory given  Plan Discussed with: CRNA  Anesthesia Plan Comments:        Anesthesia Quick Evaluation

## 2015-03-14 NOTE — Op Note (Signed)
[facility] Patient Name: Ian Parker Procedure Date: 03/14/2015 2:04 PM MRN: [id number] Account #: [id number] Date of Birth: 03/19/1931 Admit Type: Outpatient Age: 81 Room: [facility] ENDO ROOM 4 Gender: Male Note Status: Finalized Procedure:         Upper GI endoscopy Indications:       Dysphagia, Abnormal abdominal x-ray of the GI tract Providers:         Lupita Dawn. Candace Cruise, MD Referring MD:      Hewitt Blade. Sarina Ser, MD (Referring MD), Roena Malady,                     MD (Referring MD) Medicines:         Monitored Anesthesia Care Complications:     No immediate complications. Procedure:         Pre-Anesthesia Assessment:                    - Prior to the procedure, a History and Physical was                     performed, and patient medications, allergies and                     sensitivities were reviewed. The patient's tolerance of                     previous anesthesia was reviewed.                    - The risks and benefits of the procedure and the sedation                     options and risks were discussed with the patient. All                     questions were answered and informed consent was obtained.                    - After reviewing the risks and benefits, the patient was                     deemed in satisfactory condition to undergo the procedure.                    After obtaining informed consent, the endoscope was passed                     under direct vision. Throughout the procedure, the                     patient's blood pressure, pulse, and oxygen saturations                     were monitored continuously. The Endoscope was introduced                     through the mouth, and advanced to the second part of                     duodenum. The upper GI endoscopy was accomplished without                     difficulty. The patient tolerated the procedure well. The  upper GI endoscopy was accomplished  without difficulty.                     The patient tolerated the procedure well. Findings:      A benign-appearing, intrinsic mild stenosis was found at the       gastroesophageal junction. The scope was withdrawn. Dilation was       performed with a Maloney dilator with mild resistance at 56 Fr.      The exam was otherwise without abnormality.      The entire examined stomach was normal.      The examined duodenum was normal. Impression:        - Benign-appearing esophageal stricture. Dilated.                    - The examination was otherwise normal.                    - Normal stomach.                    - Normal examined duodenum.                    - No specimens collected. Recommendation:    - Discharge patient to home.                    - Observe patient's clinical course.                    - Continue present medications.                    - The findings and recommendations were discussed with the                     patient. Procedure Code(s): --- Professional ---                    [phone], Esophagogastroduodenoscopy, flexible, transoral;                     diagnostic, including collection of specimen(s) by                     brushing or washing, when performed (separate procedure)                    43450, Dilation of esophagus, by unguided sound or bougie,                     single or multiple passes Diagnosis Code(s): --- Professional ---                    K22.2, Esophageal obstruction                    R13.10, Dysphagia, unspecified                    R93.3, Abnormal findings on diagnostic imaging of other                     parts of digestive tract CPT copyright 2014 [employer]. All rights reserved. The codes documented in this report are preliminary and upon coder review may  be revised to meet current compliance requirements. Hulen Luster, MD 03/14/2015 2:18:27 PM This report has been signed electronically. Number  of Addenda: 0 Note Initiated  On: 03/14/2015 2:04 PM      [facility]

## 2015-03-14 NOTE — Anesthesia Postprocedure Evaluation (Signed)
  Anesthesia Post-op Note  [name]  Procedure(s) Performed: Procedure(s): ESOPHAGOGASTRODUODENOSCOPY (EGD) (N/A)  Anesthesia type:MAC  Patient location: PACU  Post pain: Pain level controlled  Post assessment: Post-op Vital signs reviewed, Patient's Cardiovascular Status Stable, Respiratory Function Stable, Patent Airway and No signs of Nausea or vomiting  Post vital signs: Reviewed and stable  Last Vitals:  Filed Vitals:   03/14/15 1424  BP: 118/76  Pulse: 62  Temp: 36.4 C  Resp: 10    Level of consciousness: awake, alert  and patient cooperative  Complications: No apparent anesthesia complications

## 2015-03-14 NOTE — Transfer of Care (Deleted)
Immediate Anesthesia Transfer of Care Note  [name]  Procedure(s) Performed: Procedure(s): ESOPHAGOGASTRODUODENOSCOPY (EGD) (N/A)  Patient Location: Endoscopy Unit  Anesthesia Type:General  Level of Consciousness: sedated  Airway & Oxygen Therapy: Patient Spontanous Breathing and Patient connected to nasal cannula oxygen  Post-op Assessment: Report given to RN and Post -op Vital signs reviewed and stable  Post vital signs: stable  Last Vitals:  Filed Vitals:   03/14/15 1314  BP: 110/52  Pulse: 44  Temp: 35.9 C  Resp: 14    Complications: No apparent anesthesia complications

## 2015-03-14 NOTE — H&P (Signed)
  Date of Initial H&P: 02/27/2015  History reviewed, patient examined, no change in status, stable for surgery.

## 2015-03-15 ENCOUNTER — Encounter: Payer: Self-pay | Admitting: Gastroenterology

## 2015-06-18 ENCOUNTER — Encounter: Payer: Self-pay | Admitting: *Deleted

## 2015-06-18 ENCOUNTER — Other Ambulatory Visit: Payer: Self-pay | Admitting: *Deleted

## 2015-06-26 ENCOUNTER — Ambulatory Visit (INDEPENDENT_AMBULATORY_CARE_PROVIDER_SITE_OTHER): Payer: Medicare Other | Admitting: Urology

## 2015-06-26 ENCOUNTER — Encounter: Payer: Self-pay | Admitting: Urology

## 2015-06-26 VITALS — BP 139/80 | HR 68 | Resp 18 | Ht 70.0 in | Wt 158.7 lb

## 2015-06-26 DIAGNOSIS — C61 Malignant neoplasm of prostate: Secondary | ICD-10-CM | POA: Diagnosis not present

## 2015-06-26 DIAGNOSIS — N528 Other male erectile dysfunction: Secondary | ICD-10-CM | POA: Diagnosis not present

## 2015-06-26 DIAGNOSIS — K649 Unspecified hemorrhoids: Secondary | ICD-10-CM

## 2015-06-26 DIAGNOSIS — N529 Male erectile dysfunction, unspecified: Secondary | ICD-10-CM

## 2015-06-26 DIAGNOSIS — N401 Enlarged prostate with lower urinary tract symptoms: Secondary | ICD-10-CM | POA: Insufficient documentation

## 2015-06-26 DIAGNOSIS — N138 Other obstructive and reflux uropathy: Secondary | ICD-10-CM

## 2015-06-26 LAB — BLADDER SCAN AMB NON-IMAGING

## 2015-06-26 MED ORDER — TADALAFIL 20 MG PO TABS: 20.0000 mg | ORAL_TABLET | Freq: Every day | ORAL | Status: DC | PRN

## 2015-06-26 MED ORDER — HYDROCORTISONE 2.5 % RE CREA: 1.0000 "application " | TOPICAL_CREAM | Freq: Two times a day (BID) | RECTAL | Status: DC

## 2015-06-26 NOTE — Progress Notes (Signed)
06/26/2015 11:09 AM   Ian Parker 03-04-1931 [id number]  Referring provider: Madelyn Brunner, MD [facility] [place], [place] [address]  Chief Complaint  Patient presents with  . Prostate Cancer  . Elevated PSA  . Follow-up    1 year f/u    HPI: Ian Parker is an 81 year old African-American male with a history of prostate cancer, erectile dysfunction and BPH with LUTS who presents today for a one year follow up.  Prostate cancer Patient underwent cryoablation of his prostate cancer in 2007. His PSA at the time of diagnosis was 5.3 ng/mL. I do not have his biopsy pathology available to me.   His most recent PSA was 0.3 ng/mL on 06/25/2014.     Erectile dysfunction Patient's erectile dysfunction have been managed with tri-mix injections in the past with good success. Most recently, he's had difficulty injecting himself with the medication and his wife is unable to inject the medication. He would like to retry PD 5 inhibitors at this time.  His SHIM score today is 5, which is severe ED.   BPH WITH LUTS His IPSS score today is 7, which is mild lower urinary tract symptomatology. He is mixed with his quality life due to his urinary symptoms. His PVR is 58  mL.     His major complaint today is urgency.  He has had these symptoms for many years.  He denies any dysuria, hematuria or suprapubic pain.   He currently taking tamsulosin.  His has had cystoscopic examintation in 2015 which demonstrated mild middle lobe enlargement.    He also denies any recent fevers, chills, nausea or vomiting.  He has a family history of PCa, with his brother also being diagnosed with PCa.      IPSS      06/26/15 1100       International Prostate Symptom Score   How often have you had the sensation of not emptying your bladder? Not at All     How often have you had to urinate less than every two hours? Less than 1 in 5 times     How often have you found you stopped and started again  several times when you urinated? Not at All     How often have you found it difficult to postpone urination? About half the time     How often have you had a weak urinary stream? Less than half the time     How often have you had to strain to start urination? Not at All     How many times did you typically get up at night to urinate? 1 Time     Total IPSS Score 7     Quality of Life due to urinary symptoms   If you were to spend the rest of your life with your urinary condition just the way it is now how would you feel about that? Mixed        Score:  1-7 Mild 8-19 Moderate 20-35 Severe      PMH: Past Medical History  Diagnosis Date  . Prostate cancer   . Dysuria   . Rectal burning   . Abnormal EKG   . BPH (benign prostatic hyperplasia)   . Gastric ulcer   . Abnormal PSA   . Hemorrhoid   . Goiter   . Erectile dysfunction   . Inflammatory disease of prostate   . Skin lesion   . Irritation of perirectal skin  Surgical History: Past Surgical History  Procedure Laterality Date  . Esophagogastroduodenoscopy N/A 03/14/2015    Procedure: ESOPHAGOGASTRODUODENOSCOPY (EGD);  Surgeon: Hulen Luster, MD;  Location: [facility] ENDOSCOPY;  Service: Gastroenterology;  Laterality: N/A;  . Cryoablation of prostate  02/23/2006  . Thyroidectomy      Subtotal    Home Medications:    Medication List       This list is accurate as of: 06/26/15 11:09 AM.  Always use your most recent med list.               amLODipine 5 MG tablet  Commonly known as:  NORVASC  Take 5 mg by mouth daily.     cholecalciferol 1000 UNITS tablet  Commonly known as:  VITAMIN D  Take 1,000 Units by mouth daily. Two tablets once daily     cyanocobalamin 1000 MCG/ML injection  Commonly known as:  (VITAMIN B-12)  Inject into the muscle.     lidocaine 2 % jelly  Commonly known as:  XYLOCAINE  Apply topically.     potassium chloride SA 20 MEQ tablet  Commonly known as:  K-DUR,KLOR-CON  Take 20 mEq by  mouth daily.     psyllium 58.6 % powder  Commonly known as:  METAMUCIL  Take 1 packet by mouth 2 (two) times daily.     tamsulosin 0.4 MG Caps capsule  Commonly known as:  FLOMAX  Take 0.4 mg by mouth daily.     triamterene-hydrochlorothiazide 75-50 MG per tablet  Commonly known as:  MAXZIDE  Take 1 tablet by mouth daily.        Allergies:  Allergies  Allergen Reactions  . Ace Inhibitors   . Peanuts [Peanut Oil]   . Penicillins   . Sulfa Antibiotics     Family History: Family History  Problem Relation Age of Onset  . Prostate cancer Brother   . Brain cancer Father     tumor unknown if cancer  . Heart attack Mother   . Heart attack Sister     Social History:  reports that he has never smoked. He does not have any smokeless tobacco history on file. He reports that he does not drink alcohol. His drug history is not on file.  ROS: UROLOGY Frequent Urination?: No Hard to postpone urination?: No Burning/pain with urination?: No Get up at night to urinate?: No Leakage of urine?: No Urine stream starts and stops?: No Trouble starting stream?: No Do you have to strain to urinate?: No Blood in urine?: No Urinary tract infection?: No Sexually transmitted disease?: No Injury to kidneys or bladder?: No Painful intercourse?: No Weak stream?: No Erection problems?: Yes Penile pain?: No  Gastrointestinal Nausea?: No Vomiting?: No Indigestion/heartburn?: No Diarrhea?: No Constipation?: No  Constitutional Fever: No Night sweats?: No Weight loss?: No Fatigue?: No  Skin Skin rash/lesions?: No Itching?: No  Eyes Blurred vision?: No Double vision?: No  Ears/Nose/Throat Sore throat?: No Sinus problems?: No  Hematologic/Lymphatic Swollen glands?: No Easy bruising?: No  Cardiovascular Leg swelling?: No Chest pain?: No  Respiratory Cough?: No Shortness of breath?: No  Endocrine Excessive thirst?: No  Musculoskeletal Back pain?: No Joint pain?:  No  Neurological Headaches?: No Dizziness?: No  Psychologic Depression?: No Anxiety?: No  Physical Exam: BP 139/80 mmHg  Pulse 68  Resp 18  Ht 5\' 10"  (1.778 m)  Wt 158 lb 11.2 oz (71.986 kg)  BMI 22.77 kg/m2  GU: Patient with uncircumcised phallus. Foreskin easily retracted Urethral meatus is patent.  No penile discharge. No penile lesions or rashes. Scrotum without lesions, cysts, rashes and/or edema.  Testicles are located scrotally bilaterally. No masses are appreciated in the testicles. Left and right epididymis are normal. Rectal: Patient with  normal sphincter tone. Perineum without scarring or rashes. No rectal masses are appreciated. Prostate is approximately 40 grams,  nodular are appreciated. Seminal vesicles are normal.   Laboratory Data:  Lab Results  Component Value Date   CREATININE 1.21 05/07/2013   PSA history:       0.2 ng/mL on 01/24/2013       0.3 ng/mL on 06/25/2014  Pertinent Imaging: Results for orders placed or performed in visit on 06/26/15  BLADDER SCAN AMB NON-IMAGING  Result Value Ref Range   Scan Result 58ml     Assessment & Plan:    1. Prostate cancer:   Patient underwent cryoablation of his prostate cancer in 2007. His PSA at the time of diagnosis was 5.3 ng/mL.  His most recent PSA was 0.3 ng/mL on 06/25/2014.     - PSA  2. Erectile dysfunction:   SHIM score is 5.  He is no longer finding the tri-mix effective. He would like to retry PDE 5 inhibitor. I have given him Cialis samples.  3. BPH with LUTS:    Patient's IPSS score is 7/3.  His PVR 58 mL.  His DRE demonstrates mild enlargement.  We'll continue the Flomax. He will follow up in 12 months for a PSA, DRE, PVR and an IPSS.    - BLADDER SCAN AMB NON-IMAGING  4. Hemorrhoidal tag:   Patient is requesting a refill on his prescription of Anusol.  I have sent a prescription in to his pharmacy.    No Follow-up on file.  Zara Council, [facility] [address] [place], [place] [address] [phone]

## 2015-06-27 ENCOUNTER — Telehealth: Payer: Self-pay

## 2015-06-27 LAB — PSA: Prostate Specific Ag, Serum: 0.2 ng/mL (ref 0.0–4.0)

## 2015-06-27 NOTE — Telephone Encounter (Signed)
LMOM

## 2015-06-27 NOTE — Telephone Encounter (Signed)
-----   Message from Nori Riis, PA-C sent at 06/27/2015  8:04 AM EDT ----- Patient's PSA is stable.  We will see him in 12 months.  PSA to be drawn before his next appointment.

## 2015-06-28 NOTE — Telephone Encounter (Signed)
Spoke with pt wife in reference to lab results. Wife voiced understanding.  

## 2015-06-28 NOTE — Telephone Encounter (Signed)
-----   Message from Nori Riis, PA-C sent at 06/27/2015  8:04 AM EDT ----- Patient's PSA is stable.  We will see him in 12 months.  PSA to be drawn before his next appointment.

## 2015-07-12 DIAGNOSIS — D519 Vitamin B12 deficiency anemia, unspecified: Secondary | ICD-10-CM | POA: Insufficient documentation

## 2015-11-08 ENCOUNTER — Telehealth: Payer: Self-pay | Admitting: Family Medicine

## 2015-11-08 NOTE — Telephone Encounter (Signed)
Pt came in would like a call back from [place]. Stated he wants to talk with MAC. Please call ASAP. Ok to leave message for pt to call back. Thanks.

## 2015-11-12 NOTE — Telephone Encounter (Signed)
Patient called and told MAC out of office until 11/28/15, offered to make an appointment but he declined, just wants to speak with Dr. Jeananne Rama when he returns

## 2015-11-12 NOTE — Telephone Encounter (Signed)
Patient

## 2015-11-28 NOTE — Telephone Encounter (Signed)
Patient wanted to speak with MAC on his returned

## 2015-12-02 NOTE — Telephone Encounter (Signed)
Call pt 

## 2015-12-20 ENCOUNTER — Other Ambulatory Visit: Payer: Self-pay

## 2015-12-20 DIAGNOSIS — R972 Elevated prostate specific antigen [PSA]: Secondary | ICD-10-CM

## 2015-12-23 ENCOUNTER — Other Ambulatory Visit: Payer: Medicare Other

## 2015-12-23 DIAGNOSIS — R972 Elevated prostate specific antigen [PSA]: Secondary | ICD-10-CM

## 2015-12-24 ENCOUNTER — Encounter: Payer: Self-pay | Admitting: Urology

## 2015-12-24 ENCOUNTER — Ambulatory Visit (INDEPENDENT_AMBULATORY_CARE_PROVIDER_SITE_OTHER): Payer: Medicare Other | Admitting: Urology

## 2015-12-24 VITALS — BP 152/76 | HR 80 | Ht 70.0 in | Wt 157.9 lb

## 2015-12-24 DIAGNOSIS — N138 Other obstructive and reflux uropathy: Secondary | ICD-10-CM

## 2015-12-24 DIAGNOSIS — C61 Malignant neoplasm of prostate: Secondary | ICD-10-CM | POA: Diagnosis not present

## 2015-12-24 DIAGNOSIS — N401 Enlarged prostate with lower urinary tract symptoms: Secondary | ICD-10-CM | POA: Diagnosis not present

## 2015-12-24 DIAGNOSIS — N529 Male erectile dysfunction, unspecified: Secondary | ICD-10-CM

## 2015-12-24 DIAGNOSIS — N528 Other male erectile dysfunction: Secondary | ICD-10-CM

## 2015-12-24 LAB — PSA: Prostate Specific Ag, Serum: 0.3 ng/mL (ref 0.0–4.0)

## 2015-12-24 NOTE — Progress Notes (Signed)
10:23 AM   SHANG ADMIRE [birth year] TV:[id number]  Referring provider: Madelyn Brunner, MD [facility] [place],  [address]  Chief Complaint  Patient presents with  . Prostate Cancer    6 month follow up  . Erectile Dysfunction    HPI: Mr. Widrig is an 82 year old African-American male with a history of prostate cancer, erectile dysfunction and BPH with LUTS who presents today for a one year follow up.  Prostate cancer Patient underwent cryoablation of his prostate cancer in 2007. His PSA at the time of diagnosis was 5.3 ng/mL. I do not have his biopsy pathology available to me.   His most recent PSA was 0.3 ng/mL on 12/23/2015.     Erectile dysfunction Patient's erectile dysfunction have been managed with tri-mix injections in the past with good success. Most recently, he's had difficulty injecting himself with the medication and his wife is unable to inject the medication.  PDE5-inhibitors are not effective.    His SHIM score today is 5, which is severe ED.  He does not complain of penile curvature or pain with erections.  He is wondering what other options he may have for ED.       SHIM      12/24/15 1003       SHIM: Over the last 6 months:   How do you rate your confidence that you could get and keep an erection? Very Low     When you had erections with sexual stimulation, how often were your erections hard enough for penetration (entering your partner)? Almost Never or Never     During sexual intercourse, how often were you able to maintain your erection after you had penetrated (entered) your partner? Extremely Difficult     During sexual intercourse, how difficult was it to maintain your erection to completion of intercourse? Extremely Difficult     When you attempted sexual intercourse, how often was it satisfactory for you? Extremely Difficult     SHIM Total Score   SHIM 5        Score: 1-7 Severe ED 8-11 Moderate ED 12-16  Mild-Moderate ED 17-21 Mild ED 22-25 No ED  BPH WITH LUTS His IPSS score today is 6, which is mild lower urinary tract symptomatology. He is mixed with his quality life due to his urinary symptoms. His previous PVR was 58  mL.   He denies any dysuria, hematuria or suprapubic pain.  He currently taking tamsulosin.  His has had cystoscopic examination in 2015 which demonstrated mild middle lobe enlargement.  He also denies any recent fevers, chills, nausea or vomiting.  He has a family history of PCa, with his brother also being diagnosed with PCa.      IPSS      12/24/15 1000       International Prostate Symptom Score   How often have you had the sensation of not emptying your bladder? Not at All     How often have you had to urinate less than every two hours? Less than half the time     How often have you found you stopped and started again several times when you urinated? Less than half the time     How often have you found it difficult to postpone urination? Less than 1 in 5 times     How often have you had a weak urinary stream? Not at All     How often have you had  to strain to start urination? Not at All     How many times did you typically get up at night to urinate? 1 Time     Total IPSS Score 6     Quality of Life due to urinary symptoms   If you were to spend the rest of your life with your urinary condition just the way it is now how would you feel about that? Mostly Satisfied        Score:  1-7 Mild 8-19 Moderate 20-35 Severe      PMH: Past Medical History  Diagnosis Date  . Prostate cancer ([place])   . Dysuria   . Rectal burning   . Abnormal EKG   . BPH (benign prostatic hyperplasia)   . Gastric ulcer   . Abnormal PSA   . Hemorrhoid   . Goiter   . Erectile dysfunction   . Inflammatory disease of prostate   . Skin lesion   . Irritation of perirectal skin     Surgical History: Past Surgical History  Procedure Laterality Date  .  Esophagogastroduodenoscopy N/A 03/14/2015    Procedure: ESOPHAGOGASTRODUODENOSCOPY (EGD);  Surgeon: Hulen Luster, MD;  Location: [facility] ENDOSCOPY;  Service: Gastroenterology;  Laterality: N/A;  . Cryoablation of prostate  02/23/2006  . Thyroidectomy      Subtotal    Home Medications:    Medication List       This list is accurate as of: 12/24/15 10:23 AM.  Always use your most recent med list.               amLODipine 5 MG tablet  Commonly known as:  NORVASC  Take 5 mg by mouth daily.     cholecalciferol 1000 units tablet  Commonly known as:  VITAMIN D  Take 1,000 Units by mouth daily. Two tablets once daily     cyanocobalamin 1000 MCG/ML injection  Commonly known as:  (VITAMIN B-12)  Inject into the muscle.     fluticasone 50 MCG/ACT nasal spray  Commonly known as:  FLONASE     hydrocortisone 2.5 % rectal cream  Commonly known as:  ANUSOL-HC  Place 1 application rectally 2 (two) times daily.     lidocaine 2 % jelly  Commonly known as:  XYLOCAINE  Apply topically. Reported on 12/24/2015     potassium chloride SA 20 MEQ tablet  Commonly known as:  K-DUR,KLOR-CON  Take 20 mEq by mouth daily. Reported on 12/24/2015     psyllium 58.6 % powder  Commonly known as:  METAMUCIL  Take 1 packet by mouth 2 (two) times daily.     tadalafil 20 MG tablet  Commonly known as:  CIALIS  Take 1 tablet (20 mg total) by mouth daily as needed for erectile dysfunction.     tamsulosin 0.4 MG Caps capsule  Commonly known as:  FLOMAX  Take 0.4 mg by mouth daily.     triamterene-hydrochlorothiazide 75-50 MG tablet  Commonly known as:  MAXZIDE  Take 1 tablet by mouth daily.        Allergies:  Allergies  Allergen Reactions  . Ace Inhibitors   . Peanuts [Peanut Oil]   . Penicillins   . Sulfa Antibiotics     Family History: Family History  Problem Relation Age of Onset  . Prostate cancer Brother   . Brain cancer Father     tumor unknown if cancer  . Heart attack Mother   .  Heart attack Sister   . Kidney disease  Neg Hx     Social History:  reports that he has never smoked. He does not have any smokeless tobacco history on file. He reports that he does not drink alcohol or use illicit drugs.  ROS: UROLOGY Frequent Urination?: No Hard to postpone urination?: No Burning/pain with urination?: No Get up at night to urinate?: No Leakage of urine?: No Urine stream starts and stops?: No Trouble starting stream?: No Do you have to strain to urinate?: No Blood in urine?: No Urinary tract infection?: No Sexually transmitted disease?: No Injury to kidneys or bladder?: No Painful intercourse?: No Weak stream?: No Erection problems?: No Penile pain?: No  Gastrointestinal Nausea?: No Vomiting?: No Indigestion/heartburn?: No Diarrhea?: No Constipation?: No  Constitutional Fever: No Night sweats?: No Weight loss?: No Fatigue?: No  Skin Skin rash/lesions?: No Itching?: No  Eyes Blurred vision?: No Double vision?: No  Ears/Nose/Throat Sore throat?: No Sinus problems?: No  Hematologic/Lymphatic Swollen glands?: No Easy bruising?: No  Cardiovascular Leg swelling?: No Chest pain?: No  Respiratory Cough?: No Shortness of breath?: No  Endocrine Excessive thirst?: No  Musculoskeletal Back pain?: No Joint pain?: No  Neurological Headaches?: No Dizziness?: No  Psychologic Depression?: No Anxiety?: No  Physical Exam: BP 152/76 mmHg  Pulse 80  Ht 5\' 10"  (1.778 m)  Wt 157 lb 14.4 oz (71.623 kg)  BMI 22.66 kg/m2  GU: Patient with uncircumcised phallus. Foreskin easily retracted Urethral meatus is patent.  No penile discharge. No penile lesions or rashes. Scrotum without lesions, cysts, rashes and/or edema.  Testicles are located scrotally bilaterally. No masses are appreciated in the testicles. Left and right epididymis are normal. Rectal: Patient with  normal sphincter tone. Perineum without scarring or rashes. No rectal masses are  appreciated. Prostate is approximately 40 grams,  nodules are appreciated. Seminal vesicles are normal.   Laboratory Data:  Lab Results  Component Value Date   CREATININE 1.21 05/07/2013   PSA history:       0.2 ng/mL on 01/24/2013       0.3 ng/mL on 06/25/2014       0.2 ng/mL on 06/26/2015  Results for orders placed or performed in visit on 12/23/15  PSA  Result Value Ref Range   Prostate Specific Ag, Serum 0.3 0.0 - 4.0 ng/mL    Assessment & Plan:    1. Prostate cancer:   Patient underwent cryoablation of his prostate cancer in 2007. His PSA at the time of diagnosis was 5.3 ng/mL.  His most recent PSA was 0.3 ng/mL on 12/23/2015.   He will returning to the clinic in 6 months for an IPSS score, exam and PSA.    2. Erectile dysfunction:   SHIM score is 5.   He has failed PDE5-inhibitors and Trimix,  intra-urethral suppositories would be cost prohibitive, and he is not a good candidate for a penile prosthesis implantation.  We did discuss the vacuum constriction device and have I given him the brochure.  He would like to discuss this with his wife.  He will RTC in 6 months for a SHIM score and exam.    3. BPH with LUTS:    Patient's IPSS score is 6/2.   We'll continue the Flomax. He will follow up in 6 months for a PSA, exam and an IPSS score.    Return in about 6 months (around 06/22/2016) for IPSS score, SHIM score and exam.  Zara Council, PA-C  [facility] [address], [place], [place] [address] [phone]  [phone]   

## 2015-12-27 ENCOUNTER — Ambulatory Visit: Payer: Medicare Other | Admitting: Urology

## 2016-06-17 ENCOUNTER — Other Ambulatory Visit: Payer: Self-pay

## 2016-06-17 DIAGNOSIS — Z8546 Personal history of malignant neoplasm of prostate: Secondary | ICD-10-CM

## 2016-06-19 ENCOUNTER — Other Ambulatory Visit: Payer: Medicare Other

## 2016-06-19 DIAGNOSIS — Z8546 Personal history of malignant neoplasm of prostate: Secondary | ICD-10-CM

## 2016-06-20 LAB — PSA: PROSTATE SPECIFIC AG, SERUM: 0.4 ng/mL (ref 0.0–4.0)

## 2016-06-23 ENCOUNTER — Ambulatory Visit (INDEPENDENT_AMBULATORY_CARE_PROVIDER_SITE_OTHER): Payer: Medicare Other | Admitting: Urology

## 2016-06-23 ENCOUNTER — Encounter: Payer: Self-pay | Admitting: Urology

## 2016-06-23 VITALS — BP 125/76 | HR 80 | Ht 70.0 in | Wt 156.4 lb

## 2016-06-23 DIAGNOSIS — C61 Malignant neoplasm of prostate: Secondary | ICD-10-CM | POA: Diagnosis not present

## 2016-06-23 DIAGNOSIS — N401 Enlarged prostate with lower urinary tract symptoms: Secondary | ICD-10-CM | POA: Diagnosis not present

## 2016-06-23 DIAGNOSIS — N528 Other male erectile dysfunction: Secondary | ICD-10-CM

## 2016-06-23 DIAGNOSIS — N529 Male erectile dysfunction, unspecified: Secondary | ICD-10-CM

## 2016-06-23 DIAGNOSIS — N138 Other obstructive and reflux uropathy: Secondary | ICD-10-CM

## 2016-06-23 MED ORDER — TAMSULOSIN HCL 0.4 MG PO CAPS: 0.4000 mg | ORAL_CAPSULE | Freq: Every day | ORAL | 12 refills | Status: DC

## 2016-06-23 NOTE — Progress Notes (Signed)
9:08 AM   Ian Parker 24-May-1931 [id number]  Referring provider: Madelyn Brunner, MD [facility] [place], [place] [address]  Chief Complaint  Patient presents with  . Follow-up    Prostate Cancer    HPI: Mr. Ribelin is an 82 year old African-American male who presents with his wife with a history of prostate cancer, erectile dysfunction and BPH with LUTS who presents today for a six months follow up.  Prostate cancer Patient underwent cryoablation of his prostate cancer in 2007. His PSA at the time of diagnosis was 5.3 ng/mL. I do not have his biopsy pathology available to me.   His most recent PSA was 0.4 ng/mL on 06/20/2016.     Erectile dysfunction His SHIM score is 7, which is severe ED.   His previous SHIM score was 5.  He has been having difficulty with erections for several years.   His major complaint is lack of firmness.  His libido is preserved.   His risk factors for ED are age, prostate cancer, HTN, peripheral neuropathy and anemia.  He denies any painful erections or curvatures with his erections.   He has tried PDE5-inhibitors and Tri-mix in the past without success.         SHIM    Row Name 06/23/16 0857         SHIM: Over the last 6 months:   How do you rate your confidence that you could get and keep an erection? Low     When you had erections with sexual stimulation, how often were your erections hard enough for penetration (entering your partner)? Almost Never or Never     During sexual intercourse, how often were you able to maintain your erection after you had penetrated (entered) your partner? Extremely Difficult     During sexual intercourse, how difficult was it to maintain your erection to completion of intercourse? Very Difficult     When you attempted sexual intercourse, how often was it satisfactory for you? Extremely Difficult       SHIM Total Score   SHIM 7        Score: 1-7 Severe ED 8-11 Moderate ED 12-16  Mild-Moderate ED 17-21 Mild ED 22-25 No ED     BPH WITH LUTS His IPSS score today is 9, which is moderate lower urinary tract symptomatology. He is pleased with his quality life due to his urinary symptoms.  His previous IPSS score 6/3.  He denies any dysuria, hematuria or suprapubic pain.  He currently taking tamsulosin.  His has had cystoscopic examination in 2015 which demonstrated mild middle lobe enlargement.  He also denies any recent fevers, chills, nausea or vomiting.  He has a family history of PCa, with his brother also being diagnosed with PCa.      [place] Name 06/23/16 0800         International Prostate Symptom Score   How often have you had the sensation of not emptying your bladder? Less than 1 in 5     How often have you had to urinate less than every two hours? Less than half the time     How often have you found you stopped and started again several times when you urinated? Not at All     How often have you found it difficult to postpone urination? Less than half the time     How often have you had a weak urinary stream? Less  than half the time     How often have you had to strain to start urination? Less than half the time     How many times did you typically get up at night to urinate? None     Total IPSS Score 9       Quality of Life due to urinary symptoms   If you were to spend the rest of your life with your urinary condition just the way it is now how would you feel about that? Pleased        Score:  1-7 Mild 8-19 Moderate 20-35 Severe   PMH: Past Medical History:  Diagnosis Date  . Abnormal EKG   . Abnormal PSA   . BPH (benign prostatic hyperplasia)   . Dysuria   . Erectile dysfunction   . Gastric ulcer   . Goiter   . Hemorrhoid   . Inflammatory disease of prostate   . Irritation of perirectal skin   . Prostate cancer ([place])   . Rectal burning   . Skin lesion     Surgical History: Past Surgical History:  Procedure Laterality Date   . cryoablation of prostate  02/23/2006  . ESOPHAGOGASTRODUODENOSCOPY N/A 03/14/2015   Procedure: ESOPHAGOGASTRODUODENOSCOPY (EGD);  Surgeon: Hulen Luster, MD;  Location: [facility] ENDOSCOPY;  Service: Gastroenterology;  Laterality: N/A;  . THYROIDECTOMY     Subtotal    Home Medications:    Medication List       Accurate as of 06/23/16  9:08 AM. Always use your most recent med list.          amLODipine 5 MG tablet Commonly known as:  NORVASC Take 5 mg by mouth daily.   cholecalciferol 1000 units tablet Commonly known as:  VITAMIN D Take 1,000 Units by mouth daily. Two tablets once daily   cyanocobalamin 1000 MCG/ML injection Commonly known as:  (VITAMIN B-12) Inject into the muscle.   fluticasone 50 MCG/ACT nasal spray Commonly known as:  FLONASE   hydrocortisone 2.5 % rectal cream Commonly known as:  ANUSOL-HC Place 1 application rectally 2 (two) times daily.   lidocaine 2 % jelly Commonly known as:  XYLOCAINE Apply topically. Reported on 12/24/2015   potassium chloride SA 20 MEQ tablet Commonly known as:  K-DUR,KLOR-CON Take 20 mEq by mouth daily. Reported on 12/24/2015   psyllium 58.6 % powder Commonly known as:  METAMUCIL Take 1 packet by mouth 2 (two) times daily.   tadalafil 20 MG tablet Commonly known as:  CIALIS Take 1 tablet (20 mg total) by mouth daily as needed for erectile dysfunction.   tamsulosin 0.4 MG Caps capsule Commonly known as:  FLOMAX Take 1 capsule (0.4 mg total) by mouth daily.   triamterene-hydrochlorothiazide 75-50 MG tablet Commonly known as:  MAXZIDE Take 1 tablet by mouth daily.       Allergies:  Allergies  Allergen Reactions  . Ace Inhibitors   . Peanuts [Peanut Oil]   . Penicillins   . Sulfa Antibiotics     Family History: Family History  Problem Relation Age of Onset  . Prostate cancer Brother   . Brain cancer Father     tumor unknown if cancer  . Heart attack Mother   . Heart attack Sister   . Kidney disease Neg Hx      Social History:  reports that he has never smoked. He has never used smokeless tobacco. He reports that he does not drink alcohol or use drugs.  ROS: UROLOGY Frequent Urination?:  Yes Hard to postpone urination?: Yes Burning/pain with urination?: No Get up at night to urinate?: No Leakage of urine?: No Urine stream starts and stops?: Yes Trouble starting stream?: No Do you have to strain to urinate?: No Blood in urine?: No Urinary tract infection?: No Sexually transmitted disease?: No Injury to kidneys or bladder?: No Painful intercourse?: No Weak stream?: No Erection problems?: No Penile pain?: No  Gastrointestinal Nausea?: No Vomiting?: No Indigestion/heartburn?: No Diarrhea?: No Constipation?: No  Constitutional Fever: No Night sweats?: No Weight loss?: No Fatigue?: No  Skin Skin rash/lesions?: No Itching?: No  Eyes Blurred vision?: No Double vision?: No  Ears/Nose/Throat Sore throat?: No Sinus problems?: Yes  Hematologic/Lymphatic Swollen glands?: No Easy bruising?: No  Cardiovascular Leg swelling?: No Chest pain?: No  Respiratory Cough?: No Shortness of breath?: No  Endocrine Excessive thirst?: No  Musculoskeletal Back pain?: No Joint pain?: No  Neurological Headaches?: No Dizziness?: No  Psychologic Depression?: No Anxiety?: No  Physical Exam: BP 125/76 (BP Location: Left Arm, Patient Position: Sitting, Cuff Size: Normal)   Pulse 80   Ht 5\' 10"  (1.778 m)   Wt 156 lb 6.4 oz (70.9 kg)   BMI 22.44 kg/m   Constitutional: Well nourished. Alert and oriented, No acute distress. HEENT: [place] AT, moist mucus membranes. Trachea midline, no masses. Cardiovascular: No clubbing, cyanosis, or edema. Respiratory: Normal respiratory effort, no increased work of breathing. GI: Abdomen is soft, non tender, non distended, no abdominal masses. Liver and spleen not palpable.  No hernias appreciated.  Stool sample for occult testing is not  indicated.   GU: No CVA tenderness.  No bladder fullness or masses.  Patient with uncircumcised phallus.  Foreskin easily retracted  Urethral meatus is patent.  No penile discharge. No penile lesions or rashes. Scrotum without lesions, cysts, rashes and/or edema.  Testicles are located scrotally bilaterally. No masses are appreciated in the testicles. Left and right epididymis are normal. Rectal: Patient with  normal sphincter tone. Anus and perineum without scarring or rashes. No rectal masses are appreciated. Prostate is approximately 45 grams, no nodules are appreciated. Seminal vesicles are normal. Skin: No rashes, bruises or suspicious lesions. Lymph: No cervical or inguinal adenopathy. Neurologic: Grossly intact, no focal deficits, moving all 4 extremities. Psychiatric: Normal mood and affect.   Laboratory Data:  Lab Results  Component Value Date   CREATININE 1.21 05/07/2013   PSA history:       0.2 ng/mL on 01/24/2013       0.3 ng/mL on 06/25/2014       0.2 ng/mL on 06/26/2015  Results for orders placed or performed in visit on 06/19/16  PSA  Result Value Ref Range   Prostate Specific Ag, Serum 0.4 0.0 - 4.0 ng/mL    Assessment & Plan:    1. Prostate cancer  - underwent cryoablation of his prostate cancer in 2007  - iPSA was 5.3 ng/mL  - recent PSA was 0.4 ng/mL on 06/19/2016  - RTC in 6 months for an exam and PSA.    2. Erectile dysfunction  - SHIM score is 7  - Wife is not interested in pursuing treatment at this time  - patient is wanting the Trimix injection, but he is having a difficult time understanding that he needs to do the injection  - offered vacuum erect aid device  - RTC in 6 months for repeat SHIM score and exam  3. BPH with LUTS  - IPSS score is 9/1, it is stable  -  Continue conservative management, avoiding bladder irritants and timed voiding's  - Continue tamsulosin 0.4 mg daily; refilled today  - RTC in 6 months for IPSS, PSA and exam     Return in about 6 months (around 12/24/2016) for IPSS, PSA and exam.  Zara Council, [facility]  [place] [address] [place], [place] [address] [phone]

## 2016-12-16 ENCOUNTER — Other Ambulatory Visit: Payer: Self-pay

## 2016-12-16 DIAGNOSIS — C61 Malignant neoplasm of prostate: Secondary | ICD-10-CM

## 2016-12-17 ENCOUNTER — Other Ambulatory Visit: Payer: Medicare Other

## 2016-12-17 DIAGNOSIS — C61 Malignant neoplasm of prostate: Secondary | ICD-10-CM

## 2016-12-18 LAB — PSA: PROSTATE SPECIFIC AG, SERUM: 0.4 ng/mL (ref 0.0–4.0)

## 2016-12-23 NOTE — Progress Notes (Signed)
9:13 AM   Ian Parker 11-20-1930 [id number]  Referring provider: Madelyn Brunner, MD [facility] [place], [place] [address]  Chief Complaint  Patient presents with  . Benign Prostatic Hypertrophy    6 month follow up    HPI: Mr. Ian Parker is an 83 year old African-American male who presents with a history of prostate cancer, erectile dysfunction and BPH with LUTS who presents today for a six months follow up.  Prostate cancer Patient underwent cryoablation of his prostate cancer in 2007. His PSA at the time of diagnosis was 5.3 ng/mL. I do not have his biopsy pathology available to me.   His most recent PSA was 0.4 ng/mL on 12/17/2016.     Erectile dysfunction His SHIM score is 7, which is severe ED.   His previous SHIM score was 5.  He has been having difficulty with erections for several years.   His major complaint is lack of firmness.  His libido is preserved.   His risk factors for ED are age, prostate cancer, HTN, peripheral neuropathy and anemia.  He denies any painful erections or curvatures with his erections.   He has tried PDE5-inhibitors and Tri-mix in the past without success.     BPH WITH LUTS His IPSS score today is 3, which is mild lower urinary tract symptomatology. He is mixed with his quality life due to his urinary symptoms.  His previous IPSS score 9/1.  He denies any dysuria, hematuria or suprapubic pain.  He currently taking tamsulosin.  His has had cystoscopic examination in 2015 which demonstrated mild middle lobe enlargement.  He also denies any recent fevers, chills, nausea or vomiting.  He has a family history of PCa, with his brother also being diagnosed with PCa.      [place] Name 12/24/16 0800         International Prostate Symptom Score   How often have you had the sensation of not emptying your bladder? Not at All     How often have you had to urinate less than every two hours? Less than 1 in 5 times     How  often have you found you stopped and started again several times when you urinated? Not at All     How often have you found it difficult to postpone urination? Less than 1 in 5 times     How often have you had a weak urinary stream? Not at All     How often have you had to strain to start urination? Not at All     How many times did you typically get up at night to urinate? 1 Time     Total IPSS Score 3       Quality of Life due to urinary symptoms   If you were to spend the rest of your life with your urinary condition just the way it is now how would you feel about that? Mixed        Score:  1-7 Mild 8-19 Moderate 20-35 Severe   PMH: Past Medical History:  Diagnosis Date  . Abnormal EKG   . Abnormal PSA   . BPH (benign prostatic hyperplasia)   . Dysuria   . Erectile dysfunction   . Gastric ulcer   . Goiter   . Hemorrhoid   . Inflammatory disease of prostate   . Irritation of perirectal skin   . Prostate cancer ([place])   . Rectal  burning   . Skin lesion     Surgical History: Past Surgical History:  Procedure Laterality Date  . cryoablation of prostate  02/23/2006  . ESOPHAGOGASTRODUODENOSCOPY N/A 03/14/2015   Procedure: ESOPHAGOGASTRODUODENOSCOPY (EGD);  Surgeon: Hulen Luster, MD;  Location: [facility] ENDOSCOPY;  Service: Gastroenterology;  Laterality: N/A;  . THYROIDECTOMY     Subtotal    Home Medications:  Allergies as of 12/24/2016      Reactions   Ace Inhibitors    Peanuts [peanut Oil]    Penicillins    Sulfa Antibiotics       Medication List       Accurate as of 12/24/16  9:13 AM. Always use your most recent med list.          amLODipine 5 MG tablet Commonly known as:  NORVASC Take 5 mg by mouth daily.   cholecalciferol 1000 units tablet Commonly known as:  VITAMIN D Take 1,000 Units by mouth daily. Two tablets once daily   cyanocobalamin 1000 MCG/ML injection Commonly known as:  (VITAMIN B-12) Inject into the muscle.   fluticasone 50 MCG/ACT nasal  spray Commonly known as:  FLONASE   hydrocortisone 2.5 % rectal cream Commonly known as:  ANUSOL-HC Place 1 application rectally 2 (two) times daily.   lidocaine 2 % jelly Commonly known as:  XYLOCAINE Apply topically. Reported on 12/24/2015   potassium chloride SA 20 MEQ tablet Commonly known as:  K-DUR,KLOR-CON Take 20 mEq by mouth daily. Reported on 12/24/2015   psyllium 58.6 % powder Commonly known as:  METAMUCIL Take 1 packet by mouth 2 (two) times daily.   tadalafil 20 MG tablet Commonly known as:  CIALIS Take 1 tablet (20 mg total) by mouth daily as needed for erectile dysfunction.   tamsulosin 0.4 MG Caps capsule Commonly known as:  FLOMAX Take 1 capsule (0.4 mg total) by mouth daily.   triamterene-hydrochlorothiazide 75-50 MG tablet Commonly known as:  MAXZIDE Take 1 tablet by mouth daily.       Allergies:  Allergies  Allergen Reactions  . Ace Inhibitors   . Peanuts [Peanut Oil]   . Penicillins   . Sulfa Antibiotics     Family History: Family History  Problem Relation Age of Onset  . Heart attack Mother   . Prostate cancer Brother   . Brain cancer Father     tumor unknown if cancer  . Heart attack Sister   . Kidney disease Neg Hx   . Kidney cancer Neg Hx     Social History:  reports that he has never smoked. He has never used smokeless tobacco. He reports that he does not drink alcohol or use drugs.  ROS: UROLOGY Frequent Urination?: Yes Hard to postpone urination?: Yes Burning/pain with urination?: No Get up at night to urinate?: No Leakage of urine?: Yes Urine stream starts and stops?: No Trouble starting stream?: No Do you have to strain to urinate?: No Blood in urine?: No Urinary tract infection?: No Sexually transmitted disease?: No Injury to kidneys or bladder?: No Painful intercourse?: No Weak stream?: Yes Erection problems?: No Penile pain?: No  Gastrointestinal Nausea?: No Vomiting?: No Indigestion/heartburn?:  No Diarrhea?: No Constipation?: No  Constitutional Fever: No Night sweats?: No Weight loss?: No Fatigue?: No  Skin Skin rash/lesions?: No Itching?: No  Eyes Blurred vision?: No Double vision?: No  Ears/Nose/Throat Sore throat?: No Sinus problems?: No  Hematologic/Lymphatic Swollen glands?: No Easy bruising?: No  Cardiovascular Leg swelling?: No Chest pain?: No  Respiratory Cough?: No  Shortness of breath?: No  Endocrine Excessive thirst?: No  Musculoskeletal Back pain?: No Joint pain?: No  Neurological Headaches?: No Dizziness?: No  Psychologic Depression?: No Anxiety?: No  Physical Exam: BP (!) 144/79   Pulse 85   Ht 5\' 11"  (1.803 m)   Wt 157 lb 3.2 oz (71.3 kg)   BMI 21.92 kg/m   Constitutional: Well nourished. Alert and oriented, No acute distress. HEENT: [place] AT, moist mucus membranes. Trachea midline, no masses. Cardiovascular: No clubbing, cyanosis, or edema. Respiratory: Normal respiratory effort, no increased work of breathing. GI: Abdomen is soft, non tender, non distended, no abdominal masses. Liver and spleen not palpable.  No hernias appreciated.  Stool sample for occult testing is not indicated.   GU: No CVA tenderness.  No bladder fullness or masses.  Patient with uncircumcised phallus.  Foreskin easily retracted  Urethral meatus is patent.  No penile discharge. No penile lesions or rashes. Scrotum without lesions, cysts, rashes and/or edema.  Testicles are located scrotally bilaterally. No masses are appreciated in the testicles. Left and right epididymis are normal. Rectal: Patient with  normal sphincter tone. Anus and perineum without scarring or rashes. No rectal masses are appreciated. Prostate is approximately 45 grams, no nodules are appreciated. Seminal vesicles are normal. Skin: No rashes, bruises or suspicious lesions. Lymph: No cervical or inguinal adenopathy. Neurologic: Grossly intact, no focal deficits, moving all 4  extremities. Psychiatric: Normal mood and affect.   Laboratory Data:  Lab Results  Component Value Date   CREATININE 1.21 05/07/2013   PSA history:       0.2 ng/mL on 01/24/2013       0.3 ng/mL on 06/25/2014       0.2 ng/mL on 06/26/2015       0.3 ng/mL on 12/23/2015       0.4 ng/mL on 06/19/2016    Results for orders placed or performed in visit on 12/17/16  PSA  Result Value Ref Range   Prostate Specific Ag, Serum 0.4 0.0 - 4.0 ng/mL    Assessment & Plan:    1. Prostate cancer  - underwent cryoablation of his prostate cancer in 2007  - iPSA was 5.3 ng/mL  - recent PSA was 0.4 ng/mL on 12/17/2016 - discussed with patient and his wife that the slow increase in his PSA may represent an active prostate cancer, but it may be do to the increase in the amount of normal prostate tissue - with his advanced age and co morbidities it is reasonable to continue close monitoring with exams and PSA's every 6 months  - RTC in 6 months for an exam and PSA.    2. Erectile dysfunction  - SHIM score is 7  - Wife is not interested in pursuing treatment at this time   3. BPH with LUTS  - IPSS score is 3/3, it is stable  - Continue conservative management, avoiding bladder irritants and timed voiding's  - Continue tamsulosin 0.4 mg daily; refilled today  - RTC in 6 months for IPSS, PSA and exam   4. Anal pruritis  - patient with a long time history of itching in anus - continually asked for something to take care of it  - ? Side effect of cyroablation  - send script for Anusol   Return in about 6 months (around 06/23/2017) for IPSS, PSA and exam.  Zara Council, [facility]  [place] [address] [place], [place] [address] [phone]

## 2016-12-24 ENCOUNTER — Ambulatory Visit (INDEPENDENT_AMBULATORY_CARE_PROVIDER_SITE_OTHER): Payer: Medicare Other | Admitting: Urology

## 2016-12-24 ENCOUNTER — Encounter: Payer: Self-pay | Admitting: Urology

## 2016-12-24 VITALS — BP 144/79 | HR 85 | Ht 71.0 in | Wt 157.2 lb

## 2016-12-24 DIAGNOSIS — C61 Malignant neoplasm of prostate: Secondary | ICD-10-CM

## 2016-12-24 DIAGNOSIS — N529 Male erectile dysfunction, unspecified: Secondary | ICD-10-CM | POA: Diagnosis not present

## 2016-12-24 DIAGNOSIS — L290 Pruritus ani: Secondary | ICD-10-CM

## 2016-12-24 DIAGNOSIS — N138 Other obstructive and reflux uropathy: Secondary | ICD-10-CM

## 2016-12-24 DIAGNOSIS — N401 Enlarged prostate with lower urinary tract symptoms: Secondary | ICD-10-CM

## 2016-12-24 MED ORDER — TAMSULOSIN HCL 0.4 MG PO CAPS: 0.4000 mg | ORAL_CAPSULE | Freq: Every day | ORAL | 12 refills | Status: DC

## 2016-12-25 ENCOUNTER — Telehealth: Payer: Self-pay | Admitting: *Deleted

## 2016-12-25 ENCOUNTER — Other Ambulatory Visit: Payer: Self-pay

## 2016-12-25 DIAGNOSIS — K649 Unspecified hemorrhoids: Secondary | ICD-10-CM

## 2016-12-25 MED ORDER — HYDROCORTISONE 2.5 % RE CREA: 1.0000 "application " | TOPICAL_CREAM | Freq: Two times a day (BID) | RECTAL | 0 refills | Status: DC

## 2016-12-25 NOTE — Telephone Encounter (Signed)
Patient called to say medications were sent to wrong pharmacy. Patient wanted them to go to the [facility] on [address]. Called pharmacy and they will pull them from [address] location and fill and call the patient.

## 2017-06-21 ENCOUNTER — Other Ambulatory Visit: Payer: Self-pay

## 2017-06-21 ENCOUNTER — Other Ambulatory Visit: Payer: Medicare Other

## 2017-06-21 DIAGNOSIS — C61 Malignant neoplasm of prostate: Secondary | ICD-10-CM

## 2017-06-22 LAB — PSA: Prostate Specific Ag, Serum: 0.3 ng/mL (ref 0.0–4.0)

## 2017-06-22 NOTE — Progress Notes (Signed)
9:10 AM   Ian Parker 09/13/1931 [id number]  Referring provider: Madelyn Brunner, MD Ian Parker Ian [place], Ian Parker [id number]  Chief Complaint  Patient presents with  . Benign Prostatic Hypertrophy    6 month follow up  . Prostate Cancer  . Erectile Dysfunction    HPI: [name] is an 83 year old African-American male who presents with a history of prostate cancer, erectile dysfunction and BPH with LUTS who presents today for a six months follow up.  Prostate cancer Patient underwent cryoablation of his prostate cancer in 2007. His PSA at the time of diagnosis was 5.3 ng/mL. I do not have his biopsy pathology available to me.   His most recent PSA was 0.3 ng/mL on 06/21/2017.     Erectile dysfunction He has been having difficulty with erections for several years.   His major complaint is lack of firmness.  His libido is preserved.   His risk factors for ED are age, prostate cancer, HTN, peripheral neuropathy and anemia.  He denies any painful erections or curvatures with his erections.   He has tried PDE5-inhibitors and Tri-mix in the past without success.  Wife is no longer interested in sexual activity.  He is requesting a retrial of the PDE5-inhibitors today.     BPH WITH LUTS His IPSS score today is 4, which is mild lower urinary tract symptomatology. He is pleased with his quality life due to his urinary symptoms.  His previous IPSS score 3/3.  His major complaint(s) today are/is feeling of incomplete emptying, frequency, urgency and nocturia. These are not bothersome to him.Marland Kitchen  He denies any dysuria, hematuria or suprapubic pain.  He currently taking tamsulosin.  His has had cystoscopic examination in 2015 which demonstrated mild middle lobe enlargement.  He also denies any recent fevers, chills, nausea or vomiting.  He has a family history of PCa, with his brother also being diagnosed with PCa.      [place] Name 06/23/17 0900         International Prostate Symptom Score   How often have you had the sensation of not emptying your bladder? Less than 1 in 5     How often have you had to urinate less than every two hours? Less than 1 in 5 times     How often have you found you stopped and started again several times when you urinated? Not at All     How often have you found it difficult to postpone urination? Less than 1 in 5 times     How often have you had a weak urinary stream? Not at All     How often have you had to strain to start urination? Not at All     How many times did you typically get up at night to urinate? 1 Time     Total IPSS Score 4       Quality of Life due to urinary symptoms   If you were to spend the rest of your life with your urinary condition just the way it is now how would you feel about that? Pleased        Score:  1-7 Mild 8-19 Moderate 20-35 Severe   PMH: Past Medical History:  Diagnosis Date  . Abnormal EKG   . Abnormal PSA   . BPH (benign prostatic hyperplasia)   . Dysuria   . Erectile dysfunction   . Gastric ulcer   .  Goiter   . Hemorrhoid   . Inflammatory disease of prostate   . Irritation of perirectal skin   . Prostate cancer ([place])   . Rectal burning   . Skin lesion     Surgical History: Past Surgical History:  Procedure Laterality Date  . cryoablation of prostate  02/23/2006  . ESOPHAGOGASTRODUODENOSCOPY N/A 03/14/2015   Procedure: ESOPHAGOGASTRODUODENOSCOPY (EGD);  Surgeon: Ian Luster, MD;  Location: [facility] ENDOSCOPY;  Service: Gastroenterology;  Laterality: N/A;  . THYROIDECTOMY     [employer]    Home Medications:  Allergies as of 06/23/2017      Reactions   Ace Inhibitors    Peanuts [peanut Oil]    Penicillins    Sulfa Antibiotics       Medication List       Accurate as of 06/23/17  9:10 AM. Always use your most recent med list.          amLODipine 5 MG tablet Commonly known as:  NORVASC Take 5 mg by mouth daily.   cholecalciferol 1000 units  tablet Commonly known as:  VITAMIN D Take 1,000 Units by mouth daily. Two tablets once daily   cyanocobalamin 1000 MCG/ML injection Commonly known as:  (VITAMIN B-12) Inject into the muscle.   finasteride 5 MG tablet Commonly known as:  PROSCAR Take by mouth.   fluticasone 50 MCG/ACT nasal spray Commonly known as:  FLONASE   hydrocortisone 2.5 % rectal cream Commonly known as:  ANUSOL-HC Place 1 application rectally 2 (two) times daily.   lidocaine 2 % jelly Commonly known as:  XYLOCAINE Apply topically. Reported on 12/24/2015   nystatin cream Commonly known as:  MYCOSTATIN Apply topically.   potassium chloride SA 20 MEQ tablet Commonly known as:  K-DUR,KLOR-CON Take 20 mEq by mouth daily. Reported on 12/24/2015   psyllium 58.6 % powder Commonly known as:  METAMUCIL Take 1 packet by mouth 2 (two) times daily.   tadalafil 20 MG tablet Commonly known as:  CIALIS Take 1 tablet (20 mg total) by mouth daily as needed for erectile dysfunction.   tamsulosin 0.4 MG Caps capsule Commonly known as:  FLOMAX Take 1 capsule (0.4 mg total) by mouth daily.   triamterene-hydrochlorothiazide 75-50 MG tablet Commonly known as:  MAXZIDE Take 1 tablet by mouth daily.       Allergies:  Allergies  Allergen Reactions  . Ace Inhibitors   . Peanuts [Peanut Oil]   . Penicillins   . Sulfa Antibiotics     Family History: Family History  Problem Relation Age of Onset  . Heart attack Mother   . Prostate cancer Brother   . Brain cancer Father        tumor unknown if cancer  . Heart attack Sister   . Kidney disease Neg Hx   . Kidney cancer Neg Hx   . Bladder Cancer Neg Hx     Social History:  reports that he has never smoked. He has never used smokeless tobacco. He reports that he does not drink alcohol or use drugs.  ROS: UROLOGY Frequent Urination?: No Hard to postpone urination?: No Burning/pain with urination?: No Get up at night to urinate?: No Leakage of urine?:  No Urine stream starts and stops?: No Trouble starting stream?: No Do you have to strain to urinate?: No Blood in urine?: No Urinary tract infection?: No Sexually transmitted disease?: No Injury to kidneys or bladder?: No Painful intercourse?: No Weak stream?: No Erection problems?: No Penile pain?: No  Gastrointestinal Nausea?:  No Vomiting?: No Indigestion/heartburn?: No Diarrhea?: No Constipation?: No  Constitutional Fever: No Night sweats?: No Weight loss?: No Fatigue?: No  Skin Skin rash/lesions?: No Itching?: No  Eyes Blurred vision?: No Double vision?: No  Ears/Nose/Throat Sore throat?: No Sinus problems?: No  Hematologic/Lymphatic Swollen glands?: No Easy bruising?: No  Cardiovascular Leg swelling?: No Chest pain?: No  Respiratory Cough?: No Shortness of breath?: No  Endocrine Excessive thirst?: No  Musculoskeletal Back pain?: No Joint pain?: No  Neurological Headaches?: No Dizziness?: No  Psychologic Depression?: No Anxiety?: No  Physical Exam: BP 132/75   Pulse 83   Ht 5\' 10"  (1.778 m)   Wt 154 lb 12.8 oz (70.2 kg)   BMI 22.21 kg/m   Constitutional: Well nourished. Alert and oriented, No acute distress. HEENT: [place] AT, moist mucus membranes. Trachea midline, no masses. Cardiovascular: No clubbing, cyanosis, or edema. Respiratory: Normal respiratory effort, no increased work of breathing. GI: Abdomen is soft, non tender, non distended, no abdominal masses. Liver and spleen not palpable.  No hernias appreciated.  Stool sample for occult testing is not indicated.   GU: No CVA tenderness.  No bladder fullness or masses.  Patient with uncircumcised phallus.  Foreskin easily retracted  Urethral meatus is patent.  No penile discharge. No penile lesions or rashes. Scrotum without lesions, cysts, rashes and/or edema.  Testicles are located scrotally bilaterally. No masses are appreciated in the testicles. Left and right epididymis are  normal. Rectal: Patient with  normal sphincter tone. Anus and perineum without scarring or rashes. No rectal masses are appreciated. Prostate is approximately 45 grams, no nodules are appreciated. Seminal vesicles are normal. Skin: No rashes, bruises or suspicious lesions. Lymph: No cervical or inguinal adenopathy. Neurologic: Grossly intact, no focal deficits, moving all 4 extremities. Psychiatric: Normal mood and affect.   Laboratory Data: PSA history:       0.2 ng/mL on 01/24/2013       0.3 ng/mL on 06/25/2014       0.2 ng/mL on 06/26/2015       0.3 ng/mL on 12/23/2015       0.4 ng/mL on 06/19/2016    Results for orders placed or performed in visit on 06/21/17  PSA  Result Value Ref Range   Prostate Specific Ag, Serum 0.3 0.0 - 4.0 ng/mL   I have reviewed the labs  Assessment & Plan:    1. Prostate cancer  - underwent cryoablation of his prostate cancer in 2007  - iPSA was 5.3 ng/mL  - recent PSA was 0.3 ng/mL on 06/21/2017   - RTC in 12 months for an exam and PSA.    2. Erectile dysfunction  - SHIM score 7  - Wife is not interested in pursuing treatment at this time  - patient requests a refill on sildenafil sent to his pharmacy; script sent   3. BPH with LUTS  - IPSS score is 3/3, it is stable  - Continue conservative management, avoiding bladder irritants and timed voiding's  - Continue tamsulosin 0.4 mg daily; refilled today  - RTC in 12 months for IPSS, PSA and exam    Return in about 1 year (around 06/23/2018) for IPSS, PSA and exam.  Zara Council, [facility]  [place] [address], [place], [place] [address] [phone]

## 2017-06-23 ENCOUNTER — Encounter: Payer: Self-pay | Admitting: Urology

## 2017-06-23 ENCOUNTER — Ambulatory Visit (INDEPENDENT_AMBULATORY_CARE_PROVIDER_SITE_OTHER): Payer: Medicare Other | Admitting: Urology

## 2017-06-23 VITALS — BP 132/75 | HR 83 | Ht 70.0 in | Wt 154.8 lb

## 2017-06-23 DIAGNOSIS — Z8546 Personal history of malignant neoplasm of prostate: Secondary | ICD-10-CM

## 2017-06-23 DIAGNOSIS — N138 Other obstructive and reflux uropathy: Secondary | ICD-10-CM

## 2017-06-23 DIAGNOSIS — N529 Male erectile dysfunction, unspecified: Secondary | ICD-10-CM | POA: Diagnosis not present

## 2017-06-23 DIAGNOSIS — N401 Enlarged prostate with lower urinary tract symptoms: Secondary | ICD-10-CM

## 2017-06-23 MED ORDER — SILDENAFIL CITRATE 20 MG PO TABS: ORAL_TABLET | ORAL | 3 refills | Status: DC

## 2017-08-04 ENCOUNTER — Telehealth: Payer: Self-pay

## 2017-08-04 NOTE — Telephone Encounter (Signed)
Pt called stating when he drinks a lot of water he urinates on himself. Pt stated he is taking his flomax. Pt denied n/v, f/c, dysuria. Please advise.

## 2017-08-06 NOTE — Telephone Encounter (Signed)
He needs to have an UA and a PVR.

## 2017-08-09 NOTE — Telephone Encounter (Signed)
Spoke with pt in reference to urinating on himself. Made pt aware will need a u/a and PVR. Pt stated that he feels like his s/s are better. Pt then stated that if things get bad again he will call back. BUA number provided.

## 2017-10-14 ENCOUNTER — Other Ambulatory Visit: Payer: Self-pay | Admitting: Unknown Physician Specialty

## 2017-10-14 DIAGNOSIS — R599 Enlarged lymph nodes, unspecified: Secondary | ICD-10-CM

## 2017-10-22 ENCOUNTER — Ambulatory Visit
Admission: RE | Admit: 2017-10-22 | Discharge: 2017-10-22 | Disposition: A | Discharge status: Home / Self Care | Payer: Medicare Other | Source: Ambulatory Visit | Attending: Unknown Physician Specialty | Admitting: Unknown Physician Specialty

## 2017-10-22 DIAGNOSIS — R599 Enlarged lymph nodes, unspecified: Secondary | ICD-10-CM

## 2017-10-22 DIAGNOSIS — E042 Nontoxic multinodular goiter: Secondary | ICD-10-CM | POA: Insufficient documentation

## 2017-10-22 MED ORDER — IOPAMIDOL (ISOVUE-300) INJECTION 61%
75.0000 mL | Freq: Once | INTRAVENOUS | Status: AC | PRN
  Administered 2017-10-22: 75 mL via INTRAVENOUS

## 2017-12-27 ENCOUNTER — Other Ambulatory Visit: Payer: Self-pay | Admitting: Urology

## 2018-02-17 ENCOUNTER — Other Ambulatory Visit: Payer: Self-pay | Admitting: Internal Medicine

## 2018-02-17 DIAGNOSIS — R413 Other amnesia: Secondary | ICD-10-CM

## 2018-02-23 ENCOUNTER — Ambulatory Visit
Admission: RE | Admit: 2018-02-23 | Discharge: 2018-02-23 | Disposition: A | Discharge status: Home / Self Care | Payer: Medicare Other | Source: Ambulatory Visit | Attending: Internal Medicine | Admitting: Internal Medicine

## 2018-02-23 DIAGNOSIS — R413 Other amnesia: Secondary | ICD-10-CM | POA: Insufficient documentation

## 2018-03-30 DIAGNOSIS — G309 Alzheimer's disease, unspecified: Secondary | ICD-10-CM | POA: Insufficient documentation

## 2018-03-30 DIAGNOSIS — F0280 Dementia in other diseases classified elsewhere without behavioral disturbance: Secondary | ICD-10-CM | POA: Insufficient documentation

## 2018-06-08 ENCOUNTER — Ambulatory Visit: Payer: Medicare Other | Attending: Neurology

## 2018-06-08 DIAGNOSIS — G4733 Obstructive sleep apnea (adult) (pediatric): Secondary | ICD-10-CM | POA: Diagnosis not present

## 2018-06-08 DIAGNOSIS — G4710 Hypersomnia, unspecified: Secondary | ICD-10-CM | POA: Diagnosis present

## 2018-06-17 ENCOUNTER — Other Ambulatory Visit: Payer: Self-pay | Admitting: Family Medicine

## 2018-06-17 DIAGNOSIS — Z8546 Personal history of malignant neoplasm of prostate: Secondary | ICD-10-CM

## 2018-06-21 ENCOUNTER — Other Ambulatory Visit: Payer: Medicare Other

## 2018-06-21 DIAGNOSIS — Z8546 Personal history of malignant neoplasm of prostate: Secondary | ICD-10-CM

## 2018-06-22 LAB — PSA: Prostate Specific Ag, Serum: 0.3 ng/mL (ref 0.0–4.0)

## 2018-06-22 NOTE — Progress Notes (Signed)
9:24 AM   Ian Parker 02/15/1931 [id number]  Referring provider: Madelyn Brunner, MD No address on file  Chief Complaint  Patient presents with  . Prostate Cancer    HPI: Mr. Ian Parker is an 84 year old African-American male who presents with a history of prostate cancer, erectile dysfunction and BPH with LUTS who presents today for a six months follow up with his wife, Ian Parker.    History of prostate cancer Patient underwent cryoablation of his prostate cancer in 2007. His PSA at the time of diagnosis was 5.3 ng/mL. I do not have his biopsy pathology available to me.   His most recent PSA was 0.3 ng/mL on 06/21/2018.    Erectile dysfunction Wife is no longer interested in sexual activity.    BPH WITH LUTS  (prostate and/or bladder) IPSS score: 11/2    Previous score: 3/3    Major complaint(s):  Frequency x several years. Denies any dysuria, hematuria or suprapubic pain.   Currently taking: tamsulosin 0.4 mg   Denies any recent fevers, chills, nausea or vomiting.  His PVR is 80 mL.    IPSS    [place] Name 06/23/18 0900         International Prostate Symptom Score   How often have you had the sensation of not emptying your bladder?  Less than half the time     How often have you had to urinate less than every two hours?  Less than half the time     How often have you found you stopped and started again several times when you urinated?  Less than 1 in 5 times     How often have you found it difficult to postpone urination?  About half the time     How often have you had a weak urinary stream?  Less than half the time     How often have you had to strain to start urination?  Not at All     How many times did you typically get up at night to urinate?  1 Time     Total IPSS Score  11       Quality of Life due to urinary symptoms   If you were to spend the rest of your life with your urinary condition just the way it is now how would you feel about that?  Mostly  Satisfied        Score:  1-7 Mild 8-19 Moderate 20-35 Severe   PMH: Past Medical History:  Diagnosis Date  . Abnormal EKG   . Abnormal PSA   . BPH (benign prostatic hyperplasia)   . Dysuria   . Erectile dysfunction   . Gastric ulcer   . Goiter   . Hemorrhoid   . Inflammatory disease of prostate   . Irritation of perirectal skin   . Prostate cancer ([place])   . Rectal burning   . Skin lesion     Surgical History: Past Surgical History:  Procedure Laterality Date  . cryoablation of prostate  02/23/2006  . ESOPHAGOGASTRODUODENOSCOPY N/A 03/14/2015   Procedure: ESOPHAGOGASTRODUODENOSCOPY (EGD);  Surgeon: Hulen Luster, MD;  Location[facility] ENDOSCOPY;  Service: Gastroenterology;  Laterality: N/A;  . THYROIDECTOMY     [employer]    Home Medications:  Allergies as of 06/23/2018      Reactions   Ace Inhibitors    Peanuts [peanut Oil]    Penicillins    Sulfa Antibiotics       Medication List  Accurate as of 06/23/18  9:24 AM. Always use your most recent med list.          amLODipine 5 MG tablet Commonly known as:  NORVASC Take 5 mg by mouth daily.   cholecalciferol 1000 units tablet Commonly known as:  VITAMIN D Take 1,000 Units by mouth daily. Two tablets once daily   cyanocobalamin 1000 MCG/ML injection Commonly known as:  (VITAMIN B-12) Inject into the muscle.   fluticasone 50 MCG/ACT nasal spray Commonly known as:  FLONASE   galantamine 4 MG tablet Commonly known as:  RAZADYNE Take 4 mg by mouth daily.   hydrocortisone 2.5 % rectal cream Commonly known as:  ANUSOL-HC Place 1 application rectally 2 (two) times daily.   lidocaine 2 % jelly Commonly known as:  XYLOCAINE Apply topically. Reported on 12/24/2015   magnesium oxide 400 MG tablet Commonly known as:  MAG-OX TAKE 1 TABLET BY MOUTH ONCE DAILY   potassium chloride SA 20 MEQ tablet Commonly known as:  K-DUR,KLOR-CON Take 20 mEq by mouth daily. Reported on 12/24/2015   psyllium 58.6 %  powder Commonly known as:  METAMUCIL Take 1 packet by mouth 2 (two) times daily.   sildenafil 20 MG tablet Commonly known as:  REVATIO Take 3 to 5 tablets two hours before intercouse on an empty stomach.  Do not take with nitrates.   tamsulosin 0.4 MG Caps capsule Commonly known as:  FLOMAX TAKE 1 CAPSULE BY MOUTH ONCE DAILY   triamterene-hydrochlorothiazide 75-50 MG tablet Commonly known as:  MAXZIDE Take 1 tablet by mouth daily.       Allergies:  Allergies  Allergen Reactions  . Ace Inhibitors   . Peanuts [Peanut Oil]   . Penicillins   . Sulfa Antibiotics     Family History: Family History  Problem Relation Age of Onset  . Heart attack Mother   . Prostate cancer Brother   . Brain cancer Father        tumor unknown if cancer  . Heart attack Sister   . Kidney disease Neg Hx   . Kidney cancer Neg Hx   . Bladder Cancer Neg Hx     Social History:  reports that he has never smoked. He has never used smokeless tobacco. He reports that he does not drink alcohol or use drugs.  ROS: UROLOGY Frequent Urination?: Yes Hard to postpone urination?: No Burning/pain with urination?: No Get up at night to urinate?: No Leakage of urine?: No Urine stream starts and stops?: No Trouble starting stream?: No Do you have to strain to urinate?: No Blood in urine?: No Urinary tract infection?: No Sexually transmitted disease?: No Injury to kidneys or bladder?: No Painful intercourse?: No Weak stream?: No Erection problems?: No Penile pain?: No  Gastrointestinal Nausea?: No Vomiting?: No Indigestion/heartburn?: No Diarrhea?: No Constipation?: No  Constitutional Fever: No Night sweats?: No Weight loss?: Yes Fatigue?: No  Skin Skin rash/lesions?: No Itching?: No  Eyes Blurred vision?: No Double vision?: No  Ears/Nose/Throat Sore throat?: No Sinus problems?: No  Hematologic/Lymphatic Swollen glands?: No Easy bruising?: No  Cardiovascular Leg swelling?:  No Chest pain?: No  Respiratory Cough?: No Shortness of breath?: Yes  Endocrine Excessive thirst?: No  Musculoskeletal Back pain?: No Joint pain?: No  Neurological Headaches?: No Dizziness?: No  Psychologic Depression?: No Anxiety?: No  Physical Exam: BP 134/89 (BP Location: Left Arm, Patient Position: Sitting, Cuff Size: Normal)   Pulse 74   Ht 5\' 10"  (1.778 m)   Wt 147 lb  1.6 oz (66.7 kg)   BMI 21.11 kg/m    Constitutional: Well nourished. Mild confusion.   No acute distress. HEENT: [place] AT, moist mucus membranes. Trachea midline, no masses. Cardiovascular: No clubbing, cyanosis, or edema. Respiratory: Normal respiratory effort, no increased work of breathing. Skin: No rashes, bruises or suspicious lesions. Lymph: No cervical or inguinal adenopathy. Neurologic: Grossly intact, no focal deficits, moving all 4 extremities. Psychiatric: Normal mood and affect.   Laboratory Data: PSA history:       0.2 ng/mL on 01/24/2013       0.3 ng/mL on 06/25/2014       0.2 ng/mL on 06/26/2015       0.3 ng/mL on 12/23/2015       0.4 ng/mL on 06/19/2016         0.3 ng/mL on 06/21/2017   Results for orders placed or performed in visit on 06/21/18  PSA  Result Value Ref Range   Prostate Specific Ag, Serum 0.3 0.0 - 4.0 ng/mL   I have reviewed the labs  Pertinent Imaging  Results for CLEM, WISENBAKER (MRN [id number]) as of 06/23/2018 10:42  Ref. Range 06/23/2018 09:32  Scan Result Unknown 55ml    Assessment & Plan:    1. History of prostate cancer Underwent cryoablation of his prostate cancer in 2007 -  iPSA was 5.3 ng/mL Recent PSA was 0.3 ng/mL on 06/21/2018 - stable RTC in 12 months for PSA  2. Erectile dysfunction Wife is not interested in sexual activity at this time.    3. BPH with LUTS IPSS score is 11/2, it is worsening Continue conservative management, avoiding bladder irritants and timed voiding's Most bothersome symptoms is/are frequency Continue  tamsulosin 0.4 mg daily RTC in 6 months for IPSS, PSA and exam   Return in about 6 months (around 12/24/2018) for IPSS, PSA and exam.  Zara Council, [facility]  [place], [place] [address] [phone]

## 2018-06-23 ENCOUNTER — Ambulatory Visit (INDEPENDENT_AMBULATORY_CARE_PROVIDER_SITE_OTHER): Payer: Medicare Other | Admitting: Urology

## 2018-06-23 ENCOUNTER — Encounter: Payer: Self-pay | Admitting: Urology

## 2018-06-23 VITALS — BP 134/89 | HR 74 | Ht 70.0 in | Wt 147.1 lb

## 2018-06-23 DIAGNOSIS — N138 Other obstructive and reflux uropathy: Secondary | ICD-10-CM

## 2018-06-23 DIAGNOSIS — N529 Male erectile dysfunction, unspecified: Secondary | ICD-10-CM

## 2018-06-23 DIAGNOSIS — N401 Enlarged prostate with lower urinary tract symptoms: Secondary | ICD-10-CM

## 2018-06-23 DIAGNOSIS — Z8546 Personal history of malignant neoplasm of prostate: Secondary | ICD-10-CM

## 2018-06-23 LAB — BLADDER SCAN AMB NON-IMAGING

## 2018-08-02 ENCOUNTER — Other Ambulatory Visit: Payer: Self-pay | Admitting: Internal Medicine

## 2018-08-02 DIAGNOSIS — R1032 Left lower quadrant pain: Secondary | ICD-10-CM

## 2018-08-10 ENCOUNTER — Ambulatory Visit
Admission: RE | Admit: 2018-08-10 | Discharge: 2018-08-10 | Disposition: A | Discharge status: Home / Self Care | Payer: Medicare Other | Source: Ambulatory Visit | Attending: Internal Medicine | Admitting: Internal Medicine

## 2018-08-10 DIAGNOSIS — N200 Calculus of kidney: Secondary | ICD-10-CM | POA: Diagnosis not present

## 2018-08-10 DIAGNOSIS — K5730 Diverticulosis of large intestine without perforation or abscess without bleeding: Secondary | ICD-10-CM | POA: Insufficient documentation

## 2018-08-10 DIAGNOSIS — R1032 Left lower quadrant pain: Secondary | ICD-10-CM | POA: Diagnosis not present

## 2018-08-10 MED ORDER — IOPAMIDOL (ISOVUE-300) INJECTION 61%
100.0000 mL | Freq: Once | INTRAVENOUS | Status: AC | PRN
  Administered 2018-08-10: 80 mL via INTRAVENOUS

## 2018-12-23 ENCOUNTER — Other Ambulatory Visit: Payer: Self-pay

## 2018-12-23 DIAGNOSIS — Z8546 Personal history of malignant neoplasm of prostate: Secondary | ICD-10-CM

## 2018-12-26 ENCOUNTER — Other Ambulatory Visit: Payer: Medicare Other

## 2018-12-26 DIAGNOSIS — Z8546 Personal history of malignant neoplasm of prostate: Secondary | ICD-10-CM

## 2018-12-27 LAB — PSA: PROSTATE SPECIFIC AG, SERUM: 0.3 ng/mL (ref 0.0–4.0)

## 2018-12-27 NOTE — Progress Notes (Signed)
12/28/2018 9:30 AM   [name] May 10, 1931 [id number]  Referring provider: Baxter Hire, MD [place], [place] [address]  Chief Complaint  Patient presents with  . History of Prostate Cancer    HPI: Ian Parker is a 85 y.o. male African-American who presents with a history of prostate cancer, erectile dysfunction and BPH with LUTS who presents today for a six months follow up with his wife, [name], and son.  History of prostate cancer Patient underwent cryoablation of his prostate cancer in 2007.  His PSA at the time of diagnosis was 5.3 ng/mL.  I do not have his biopsy pathology available to me.  His most recent PSA was 0.3 ng/mL on 12/26/2018.  Erectile dysfunction Wife is no longer interested in sexual activity.    BPH WITH LUTS  (prostate and/or bladder) IPSS score: 11/2  PVR: 3 mL   Previous score: 11/2  Previous PVR: 80 mL  Major complaint(s): No urinary complaints at this time.  Denies any dysuria, hematuria or suprapubic pain.   Currently taking: Tamsulosin 0.4 mg.  His has had persistent rectal irritation and occasional loose stools.  Patient denies constipation.  It was wondered by patient and patient's family if this could be a consequence of the treatment for prostate cancer.  It was recommended that patient obtain a referral via PCP to a gastroenterologist.  Denies any recent fevers, chills, nausea or vomiting.  He has a family history of PCa, with his brother having had it, as well as a personal history of PCa.  IPSS    Row Name 12/28/18 0900         International Prostate Symptom Score   How often have you had the sensation of not emptying your bladder?  Not at All     How often have you had to urinate less than every two hours?  Less than half the time     How often have you found you stopped and started again several times when you urinated?  Less than half the time     How often have you found it difficult to postpone  urination?  Less than half the time     How often have you had a weak urinary stream?  Less than half the time     How often have you had to strain to start urination?  Less than half the time     How many times did you typically get up at night to urinate?  1 Time     Total IPSS Score  11       Quality of Life due to urinary symptoms   If you were to spend the rest of your life with your urinary condition just the way it is now how would you feel about that?  Mixed        Score:  1-7 Mild 8-19 Moderate 20-35 Severe  PMH: Past Medical History:  Diagnosis Date  . Abnormal EKG   . Abnormal PSA   . BPH (benign prostatic hyperplasia)   . Dysuria   . Erectile dysfunction   . Gastric ulcer   . Goiter   . Hemorrhoid   . Inflammatory disease of prostate   . Irritation of perirectal skin   . Prostate cancer ([place])   . Rectal burning   . Skin lesion     Surgical History: Past Surgical History:  Procedure Laterality Date  . cryoablation of prostate  02/23/2006  . ESOPHAGOGASTRODUODENOSCOPY  N/A 03/14/2015   Procedure: ESOPHAGOGASTRODUODENOSCOPY (EGD);  Surgeon: Hulen Luster, MD;  Location: [facility] ENDOSCOPY;  Service: Gastroenterology;  Laterality: N/A;  . THYROIDECTOMY     Subtotal    Home Medications:  Allergies as of 12/28/2018      Reactions   Ace Inhibitors    Peanuts [peanut Oil]    Penicillins    Sulfa Antibiotics       Medication List       Accurate as of December 28, 2018  9:30 AM. Always use your most recent med list.        amLODipine 5 MG tablet Commonly known as:  NORVASC Take 5 mg by mouth daily.   cholecalciferol 1000 units tablet Commonly known as:  VITAMIN D Take 1,000 Units by mouth daily. Two tablets once daily   cyanocobalamin 1000 MCG/ML injection Commonly known as:  (VITAMIN B-12) Inject into the muscle.   fluticasone 50 MCG/ACT nasal spray Commonly known as:  FLONASE   galantamine 4 MG tablet Commonly known as:  RAZADYNE Take 4 mg by  mouth daily.   hydrocortisone 2.5 % rectal cream Commonly known as:  ANUSOL-HC Place 1 application rectally 2 (two) times daily.   lidocaine 2 % jelly Commonly known as:  XYLOCAINE Apply topically. Reported on 12/24/2015   magnesium oxide 400 MG tablet Commonly known as:  MAG-OX TAKE 1 TABLET BY MOUTH ONCE DAILY   potassium chloride SA 20 MEQ tablet Commonly known as:  K-DUR,KLOR-CON Take 20 mEq by mouth daily. Reported on 12/24/2015   psyllium 58.6 % powder Commonly known as:  METAMUCIL Take 1 packet by mouth 2 (two) times daily.   sildenafil 20 MG tablet Commonly known as:  REVATIO Take 3 to 5 tablets two hours before intercouse on an empty stomach.  Do not take with nitrates.   tamsulosin 0.4 MG Caps capsule Commonly known as:  FLOMAX TAKE 1 CAPSULE BY MOUTH ONCE DAILY   triamterene-hydrochlorothiazide 75-50 MG tablet Commonly known as:  MAXZIDE Take 1 tablet by mouth daily.       Allergies:  Allergies  Allergen Reactions  . Ace Inhibitors   . Peanuts [Peanut Oil]   . Penicillins   . Sulfa Antibiotics     Family History: Family History  Problem Relation Age of Onset  . Heart attack Mother   . Prostate cancer Brother   . Brain cancer Father        tumor unknown if cancer  . Heart attack Sister   . Kidney disease Neg Hx   . Kidney cancer Neg Hx   . Bladder Cancer Neg Hx     Social History:  reports that he has never smoked. He has never used smokeless tobacco. He reports that he does not drink alcohol or use drugs.  ROS: UROLOGY Frequent Urination?: No Hard to postpone urination?: No Burning/pain with urination?: No Get up at night to urinate?: No Leakage of urine?: No Urine stream starts and stops?: No Trouble starting stream?: No Do you have to strain to urinate?: No Blood in urine?: No Urinary tract infection?: No Sexually transmitted disease?: No Injury to kidneys or bladder?: No Painful intercourse?: No Weak stream?: No Erection  problems?: No Penile pain?: No  Gastrointestinal Nausea?: No Vomiting?: No Indigestion/heartburn?: No Diarrhea?: No Constipation?: No  Constitutional Fever: No Night sweats?: No Weight loss?: No Fatigue?: No  Skin Skin rash/lesions?: No Itching?: No  Eyes Blurred vision?: No Double vision?: No  Ears/Nose/Throat Sore throat?: No Sinus problems?: No  Hematologic/Lymphatic Swollen glands?: No Easy bruising?: No  Cardiovascular Leg swelling?: No Chest pain?: No  Respiratory Cough?: No Shortness of breath?: No  Endocrine Excessive thirst?: No  Musculoskeletal Back pain?: No Joint pain?: No  Neurological Headaches?: No Dizziness?: No  Psychologic Depression?: No Anxiety?: No  Physical Exam: BP (!) 162/101 (BP Location: Left Arm, Patient Position: Sitting)   Pulse 86   Ht 5\' 10"  (1.778 m)   Wt 149 lb (67.6 kg)   BMI 21.38 kg/m   Constitutional:  Well nourished. Alert and oriented, No acute distress. Cardiovascular: No clubbing, cyanosis, or edema. Respiratory: Normal respiratory effort, no increased work of breathing. GU: No CVA tenderness.  No bladder fullness or masses.  Patient with uncircumcised phallus.  Foreskin easily retracted.  Urethral meatus is patent.  No penile discharge. No penile lesions or rashes. Scrotum without lesions, cysts, rashes and/or edema.  Testicles are located scrotally bilaterally but are atrophic.  No masses are appreciated in the testicles.  Left epididymis is normal, small right spermatocele. Rectal: Patient with normal sphincter tone.  Anus and perineum without scarring or rashes.  No rectal masses are appreciated.  Prostate is approximately 30 grams, nodular.  Skin: No rashes, bruises or suspicious lesions. Neurologic: Grossly intact, no focal deficits, moving all 4 extremities. Psychiatric: Normal mood and affect.  Laboratory Data:  PSA history:       0.2 ng/mL on 01/24/2013       0.3 ng/mL on 06/25/2014       0.2  ng/mL on 06/26/2015       0.3 ng/mL on 12/23/2015       0.4 ng/mL on 06/19/2016         0.3 ng/mL on 06/21/2017       0.3 ng/mL on 12/26/2018  I have reviewed the labs  Pertinent Imaging  Results for MAXMILLIAN, CARSEY (MRN [id number]) as of 12/28/2018 09:32  Ref. Range 12/28/2018 09:16  Scan Result Unknown 60ml     Assessment & Plan:    1. History of prostate cancer - Underwent cryoablation of his prostate cancer in 2007 -  iPSA was 5.3 ng/mL - Recent PSA was 0.3 ng/mL on 12/26/2018 - stable - RTC in 6 months for PSA  2. Erectile dysfunction - Wife no longer interested in sexual activity. - Not discussed on this visit  3. BPH with LUTS - IPSS score is 11/2, it is stable - Continue conservative management, avoiding bladder irritants and timed voiding's - Most bothersome symptoms is/are frequency - Continue tamsulosin 0.4 mg daily - RTC in 6 months for IPSS, PSA and exam   4. Anal irritation - recommend to discuss with PCP as cryoablation of the prostate would not contribute to this   Return in about 6 months (around 06/28/2019) for IPSS, PSA, PVR and exam.  Zara Council, PA-C  [facility] [address], [place], [place] [address] [phone]  I, Adele Schilder, am acting as a Education administrator for [employer], PA-C.   I have reviewed the above documentation for accuracy and completeness, and I agree with the above.    Zara Council, PA-C

## 2018-12-28 ENCOUNTER — Encounter: Payer: Self-pay | Admitting: Urology

## 2018-12-28 ENCOUNTER — Ambulatory Visit (INDEPENDENT_AMBULATORY_CARE_PROVIDER_SITE_OTHER): Payer: Medicare Other | Admitting: Urology

## 2018-12-28 VITALS — BP 162/101 | HR 86 | Ht 70.0 in | Wt 149.0 lb

## 2018-12-28 DIAGNOSIS — N401 Enlarged prostate with lower urinary tract symptoms: Secondary | ICD-10-CM

## 2018-12-28 DIAGNOSIS — N138 Other obstructive and reflux uropathy: Secondary | ICD-10-CM

## 2018-12-28 DIAGNOSIS — K6289 Other specified diseases of anus and rectum: Secondary | ICD-10-CM | POA: Diagnosis not present

## 2018-12-28 DIAGNOSIS — Z8546 Personal history of malignant neoplasm of prostate: Secondary | ICD-10-CM

## 2018-12-28 LAB — BLADDER SCAN AMB NON-IMAGING

## 2018-12-28 MED ORDER — TAMSULOSIN HCL 0.4 MG PO CAPS: 0.4000 mg | ORAL_CAPSULE | Freq: Every day | ORAL | 3 refills | Status: DC

## 2019-03-22 ENCOUNTER — Other Ambulatory Visit (HOSPITAL_COMMUNITY): Payer: Self-pay | Admitting: Unknown Physician Specialty

## 2019-03-22 ENCOUNTER — Other Ambulatory Visit: Payer: Self-pay | Admitting: Unknown Physician Specialty

## 2019-03-22 DIAGNOSIS — R221 Localized swelling, mass and lump, neck: Secondary | ICD-10-CM

## 2019-04-10 ENCOUNTER — Ambulatory Visit
Admission: RE | Admit: 2019-04-10 | Discharge: 2019-04-10 | Disposition: A | Discharge status: Home / Self Care | Payer: Medicare Other | Source: Ambulatory Visit | Attending: Unknown Physician Specialty | Admitting: Unknown Physician Specialty

## 2019-04-10 ENCOUNTER — Other Ambulatory Visit: Payer: Self-pay

## 2019-04-10 DIAGNOSIS — R221 Localized swelling, mass and lump, neck: Secondary | ICD-10-CM | POA: Insufficient documentation

## 2019-04-10 MED ORDER — IOHEXOL 300 MG/ML  SOLN
75.0000 mL | Freq: Once | INTRAMUSCULAR | Status: AC | PRN
  Administered 2019-04-10: 75 mL via INTRAVENOUS

## 2019-06-22 ENCOUNTER — Other Ambulatory Visit: Payer: Self-pay

## 2019-06-22 DIAGNOSIS — Z8546 Personal history of malignant neoplasm of prostate: Secondary | ICD-10-CM

## 2019-06-23 ENCOUNTER — Other Ambulatory Visit: Payer: Medicare Other

## 2019-06-23 ENCOUNTER — Encounter: Payer: Self-pay | Admitting: Urology

## 2019-06-28 ENCOUNTER — Ambulatory Visit: Payer: Medicare Other | Admitting: Urology

## 2019-06-29 NOTE — Progress Notes (Signed)
12/28/2018 9:56 AM   Ian Parker 12-02-1930 [id number]  Referring provider: Baxter Hire, MD [place],   [id number]  Chief Complaint  Patient presents with  . Follow-up    HPI: Ian Parker is a 85 y.o. male African-American who presents with a history of prostate cancer, erectile dysfunction and BPH with LUTS who presents today for a six months follow up with his wife, Ian Parker, and son.  History of prostate cancer Patient underwent cryoablation of his prostate cancer in 2007.  His PSA at the time of diagnosis was 5.3 ng/mL.  I do not have his biopsy pathology available to me.  His most recent PSA was 0.3 ng/mL on 12/26/2018.  Erectile dysfunction Wife is no longer interested in sexual activity.    BPH WITH LUTS  (prostate and/or bladder) IPSS score: 6/2   PVR: 14 mL   Previous score: 11/2  Previous PVR: 3 mL  Major complaint(s): No urinary complaints at this time.  Denies any dysuria, hematuria or suprapubic pain.   Currently taking: Tamsulosin 0.4 mg.  Denies any recent fevers, chills, nausea or vomiting.  He has a family history of PCa, with his brother having had it, as well as a personal history of PCa.  IPSS    Row Name 06/30/19 0900         International Prostate Symptom Score   How often have you had the sensation of not emptying your bladder?  Not at All     How often have you had to urinate less than every two hours?  Less than half the time     How often have you found you stopped and started again several times when you urinated?  Not at All     How often have you found it difficult to postpone urination?  Less than 1 in 5 times     How often have you had a weak urinary stream?  Less than half the time     How often have you had to strain to start urination?  Not at All     How many times did you typically get up at night to urinate?  1 Time     Total IPSS Score  6       Quality of Life due to urinary symptoms   If you  were to spend the rest of your life with your urinary condition just the way it is now how would you feel about that?  Mostly Satisfied        Score:  1-7 Mild 8-19 Moderate 20-35 Severe  PMH: Past Medical History:  Diagnosis Date  . Abnormal EKG   . Abnormal PSA   . BPH (benign prostatic hyperplasia)   . Dysuria   . Erectile dysfunction   . Gastric ulcer   . Goiter   . Hemorrhoid   . Inflammatory disease of prostate   . Irritation of perirectal skin   . Prostate cancer ([place])   . Rectal burning   . Skin lesion     Surgical History: Past Surgical History:  Procedure Laterality Date  . cryoablation of prostate  02/23/2006  . ESOPHAGOGASTRODUODENOSCOPY N/A 03/14/2015   Procedure: ESOPHAGOGASTRODUODENOSCOPY (EGD);  Surgeon: Hulen Luster, MD;  Location: [facility] ENDOSCOPY;  Service: Gastroenterology;  Laterality: N/A;  . THYROIDECTOMY     Subtotal    Home Medications:  Allergies as of 06/30/2019      Reactions   Ace Inhibitors  Peanuts [peanut Oil]    Penicillins    Sulfa Antibiotics       Medication List       Accurate as of June 30, 2019  9:56 AM. If you have any questions, ask your nurse or doctor.        amLODipine 5 MG tablet Commonly known as: NORVASC Take 5 mg by mouth daily.   cholecalciferol 1000 units tablet Commonly known as: VITAMIN D Take 1,000 Units by mouth daily. Two tablets once daily   cyanocobalamin 1000 MCG/ML injection Commonly known as: (VITAMIN B-12) Inject into the muscle.   fluticasone 50 MCG/ACT nasal spray Commonly known as: FLONASE   galantamine 4 MG tablet Commonly known as: RAZADYNE Take 4 mg by mouth daily.   hydrocortisone 2.5 % rectal cream Commonly known as: Anusol-HC Place 1 application rectally 2 (two) times daily.   lidocaine 2 % jelly Commonly known as: XYLOCAINE Apply topically. Reported on 12/24/2015   magnesium oxide 400 MG tablet Commonly known as: MAG-OX TAKE 1 TABLET BY MOUTH ONCE DAILY   potassium  chloride SA 20 MEQ tablet Commonly known as: K-DUR Take 20 mEq by mouth daily. Reported on 12/24/2015   psyllium 58.6 % powder Commonly known as: METAMUCIL Take 1 packet by mouth 2 (two) times daily.   sildenafil 20 MG tablet Commonly known as: REVATIO Take 3 to 5 tablets two hours before intercouse on an empty stomach.  Do not take with nitrates.   tamsulosin 0.4 MG Caps capsule Commonly known as: FLOMAX Take 1 capsule (0.4 mg total) by mouth daily.   triamterene-hydrochlorothiazide 75-50 MG tablet Commonly known as: MAXZIDE Take 1 tablet by mouth daily.       Allergies:  Allergies  Allergen Reactions  . Ace Inhibitors   . Peanuts [Peanut Oil]   . Penicillins   . Sulfa Antibiotics     Family History: Family History  Problem Relation Age of Onset  . Heart attack Mother   . Prostate cancer Brother   . Brain cancer Father        tumor unknown if cancer  . Heart attack Sister   . Kidney disease Neg Hx   . Kidney cancer Neg Hx   . Bladder Cancer Neg Hx     Social History:  reports that he has never smoked. He has never used smokeless tobacco. He reports that he does not drink alcohol or use drugs.  ROS: UROLOGY Frequent Urination?: No Hard to postpone urination?: No Burning/pain with urination?: No Get up at night to urinate?: No Leakage of urine?: No Urine stream starts and stops?: No Trouble starting stream?: No Do you have to strain to urinate?: No Blood in urine?: No Urinary tract infection?: No Sexually transmitted disease?: No Injury to kidneys or bladder?: No Painful intercourse?: No Weak stream?: No Erection problems?: No Penile pain?: No  Gastrointestinal Nausea?: No Vomiting?: No Indigestion/heartburn?: No Diarrhea?: No Constipation?: No  Constitutional Fever: No Night sweats?: No Weight loss?: No Fatigue?: No  Skin Skin rash/lesions?: No Itching?: No  Eyes Blurred vision?: No Double vision?: No  Ears/Nose/Throat Sore  throat?: No Sinus problems?: No  Hematologic/Lymphatic Swollen glands?: No Easy bruising?: No  Cardiovascular Leg swelling?: No Chest pain?: No  Respiratory Cough?: No Shortness of breath?: No  Endocrine Excessive thirst?: No  Musculoskeletal Back pain?: No Joint pain?: No  Neurological Headaches?: No Dizziness?: No  Psychologic Depression?: No Anxiety?: No  Physical Exam: BP 140/80   Pulse 79   Ht 6' (  1.829 m)   Wt 155 lb (70.3 kg)   BMI 21.02 kg/m   Constitutional:  Well nourished. Alert and oriented, No acute distress. HEENT: [place] AT, moist mucus membranes.  Trachea midline, no masses. Cardiovascular: No clubbing, cyanosis, or edema. Respiratory: Normal respiratory effort, no increased work of breathing. GI: Abdomen is soft, non tender, non distended, no abdominal masses. Liver and spleen not palpable.  No hernias appreciated.  Stool sample for occult testing is not indicated.   GU: No CVA tenderness.  No bladder fullness or masses.  Patient with uncircumcised phallus.  Foreskin easily retracted   Urethral meatus is patent.  No penile discharge. No penile lesions or rashes. Scrotum without lesions, cysts, rashes and/or edema.  Testicles are located scrotally bilaterally. They are atrophic.  No masses are appreciated in the testicles. Left and right epididymis are normal. Rectal: Patient with  normal sphincter tone. Anus and perineum without scarring or rashes. No rectal masses are appreciated. Prostate is approximately 30 grams, nodular.   Skin: No rashes, bruises or suspicious lesions. Lymph: No inguinal adenopathy. Neurologic: Grossly intact, no focal deficits, moving all 4 extremities. Psychiatric: Normal mood and affect.  Laboratory Data:  PSA history:       0.2 ng/mL on 01/24/2013       0.3 ng/mL on 06/25/2014       0.2 ng/mL on 06/26/2015       0.3 ng/mL on 12/23/2015       0.4 ng/mL on 06/19/2016         0.3 ng/mL on 06/21/2017       0.3 ng/mL on  12/26/2018  I have reviewed the labs  Pertinent Imaging  Results for SHERON, ORTT (MRN [id number]) as of 06/30/2019 09:56  Ref. Range 06/30/2019 09:54  Scan Result Unknown 18    Assessment & Plan:    1. History of prostate cancer - Underwent cryoablation of his prostate cancer in 2007 -  iPSA was 5.3 ng/mL - PSA pending  - RTC in 6 months for PSA   3. BPH with LUTS - IPSS score is 6/2, it is improved  - Continue conservative management, avoiding bladder irritants and timed voiding's - Most bothersome symptoms is/are frequency - Continue tamsulosin 0.4 mg daily - RTC in 6 months for IPSS, PSA and exam    No follow-ups on file.  Zara Council, PA-C  [facility] [address] [place], [place] [address] [phone]

## 2019-06-30 ENCOUNTER — Other Ambulatory Visit: Payer: Self-pay

## 2019-06-30 ENCOUNTER — Encounter: Payer: Self-pay | Admitting: Urology

## 2019-06-30 ENCOUNTER — Ambulatory Visit (INDEPENDENT_AMBULATORY_CARE_PROVIDER_SITE_OTHER): Payer: Medicare Other | Admitting: Urology

## 2019-06-30 VITALS — BP 140/80 | HR 79 | Ht 72.0 in | Wt 155.0 lb

## 2019-06-30 DIAGNOSIS — N138 Other obstructive and reflux uropathy: Secondary | ICD-10-CM | POA: Diagnosis not present

## 2019-06-30 DIAGNOSIS — Z8546 Personal history of malignant neoplasm of prostate: Secondary | ICD-10-CM

## 2019-06-30 DIAGNOSIS — N401 Enlarged prostate with lower urinary tract symptoms: Secondary | ICD-10-CM

## 2019-06-30 LAB — BLADDER SCAN AMB NON-IMAGING: Scan Result: 18

## 2019-07-01 LAB — PSA: Prostate Specific Ag, Serum: 0.3 ng/mL (ref 0.0–4.0)

## 2019-08-07 ENCOUNTER — Emergency Department: Payer: Medicare Other

## 2019-08-07 ENCOUNTER — Observation Stay: Payer: Medicare Other

## 2019-08-07 ENCOUNTER — Other Ambulatory Visit: Payer: Self-pay

## 2019-08-07 ENCOUNTER — Observation Stay
Admission: EM | Admit: 2019-08-07 | Discharge: 2019-08-08 | Disposition: A | Discharge status: Home / Self Care | Payer: Medicare Other | Attending: Internal Medicine | Admitting: Internal Medicine

## 2019-08-07 DIAGNOSIS — N400 Enlarged prostate without lower urinary tract symptoms: Secondary | ICD-10-CM | POA: Diagnosis not present

## 2019-08-07 DIAGNOSIS — Z8546 Personal history of malignant neoplasm of prostate: Secondary | ICD-10-CM | POA: Diagnosis not present

## 2019-08-07 DIAGNOSIS — Z8042 Family history of malignant neoplasm of prostate: Secondary | ICD-10-CM | POA: Insufficient documentation

## 2019-08-07 DIAGNOSIS — I2510 Atherosclerotic heart disease of native coronary artery without angina pectoris: Secondary | ICD-10-CM | POA: Insufficient documentation

## 2019-08-07 DIAGNOSIS — Z882 Allergy status to sulfonamides status: Secondary | ICD-10-CM | POA: Diagnosis not present

## 2019-08-07 DIAGNOSIS — E041 Nontoxic single thyroid nodule: Secondary | ICD-10-CM | POA: Insufficient documentation

## 2019-08-07 DIAGNOSIS — I639 Cerebral infarction, unspecified: Secondary | ICD-10-CM | POA: Diagnosis present

## 2019-08-07 DIAGNOSIS — I119 Hypertensive heart disease without heart failure: Secondary | ICD-10-CM | POA: Insufficient documentation

## 2019-08-07 DIAGNOSIS — Z79899 Other long term (current) drug therapy: Secondary | ICD-10-CM | POA: Diagnosis not present

## 2019-08-07 DIAGNOSIS — I071 Rheumatic tricuspid insufficiency: Secondary | ICD-10-CM | POA: Diagnosis not present

## 2019-08-07 DIAGNOSIS — Z880 Allergy status to penicillin: Secondary | ICD-10-CM | POA: Diagnosis not present

## 2019-08-07 DIAGNOSIS — Z20828 Contact with and (suspected) exposure to other viral communicable diseases: Secondary | ICD-10-CM | POA: Diagnosis not present

## 2019-08-07 DIAGNOSIS — F0390 Unspecified dementia without behavioral disturbance: Secondary | ICD-10-CM | POA: Diagnosis not present

## 2019-08-07 DIAGNOSIS — R4182 Altered mental status, unspecified: Principal | ICD-10-CM | POA: Diagnosis present

## 2019-08-07 DIAGNOSIS — R2689 Other abnormalities of gait and mobility: Secondary | ICD-10-CM | POA: Diagnosis not present

## 2019-08-07 DIAGNOSIS — Z8249 Family history of ischemic heart disease and other diseases of the circulatory system: Secondary | ICD-10-CM | POA: Insufficient documentation

## 2019-08-07 DIAGNOSIS — G629 Polyneuropathy, unspecified: Secondary | ICD-10-CM | POA: Diagnosis not present

## 2019-08-07 LAB — CBC
HCT: 50.9 % (ref 39.0–52.0)
HCT: 50.9 % (ref 39.0–52.0)
Hemoglobin: 16.5 g/dL (ref 13.0–17.0)
Hemoglobin: 16.3 g/dL (ref 13.0–17.0)
MCH: 25.9 pg — ABNORMAL LOW (ref 26.0–34.0)
MCH: 26 pg (ref 26.0–34.0)
MCHC: 32.4 g/dL (ref 30.0–36.0)
MCHC: 32 g/dL (ref 30.0–36.0)
MCV: 80 fL (ref 80.0–100.0)
MCV: 81.2 fL (ref 80.0–100.0)
Platelets: 189 10*3/uL (ref 150–400)
Platelets: 179 10*3/uL (ref 150–400)
RBC: 6.36 MIL/uL — ABNORMAL HIGH (ref 4.22–5.81)
RBC: 6.27 MIL/uL — ABNORMAL HIGH (ref 4.22–5.81)
RDW: 13.3 % (ref 11.5–15.5)
RDW: 13.5 % (ref 11.5–15.5)
WBC: 5.6 10*3/uL (ref 4.0–10.5)
WBC: 5.4 10*3/uL (ref 4.0–10.5)
nRBC: 0 % (ref 0.0–0.2)
nRBC: 0 % (ref 0.0–0.2)

## 2019-08-07 LAB — URINALYSIS, COMPLETE (UACMP) WITH MICROSCOPIC
Bacteria, UA: NONE SEEN
Bilirubin Urine: NEGATIVE
Glucose, UA: NEGATIVE mg/dL
Hgb urine dipstick: NEGATIVE
Ketones, ur: NEGATIVE mg/dL
Nitrite: NEGATIVE
Protein, ur: NEGATIVE mg/dL
Specific Gravity, Urine: 1.01 (ref 1.005–1.030)
pH: 7 (ref 5.0–8.0)

## 2019-08-07 LAB — BASIC METABOLIC PANEL
Anion gap: 9 (ref 5–15)
BUN: 12 mg/dL (ref 8–23)
CO2: 28 mmol/L (ref 22–32)
Calcium: 9.1 mg/dL (ref 8.9–10.3)
Chloride: 96 mmol/L — ABNORMAL LOW (ref 98–111)
Creatinine, Ser: 1.11 mg/dL (ref 0.61–1.24)
GFR calc Af Amer: >60 mL/min (ref >60)
GFR calc non Af Amer: 60 mL/min — ABNORMAL LOW (ref >60)
Glucose, Bld: 101 mg/dL — ABNORMAL HIGH (ref 70–99)
Potassium: 3.4 mmol/L — ABNORMAL LOW (ref 3.5–5.1)
Sodium: 133 mmol/L — ABNORMAL LOW (ref 135–145)

## 2019-08-07 LAB — TROPONIN I (HIGH SENSITIVITY)
Troponin I (High Sensitivity): 8 ng/L (ref <18)
Troponin I (High Sensitivity): 9 ng/L (ref <18)

## 2019-08-07 MED ORDER — MEMANTINE HCL 5 MG PO TABS
10.0000 mg | ORAL_TABLET | Freq: Two times a day (BID) | ORAL | Status: DC
  Administered 2019-08-07 – 2019-08-08 (×2): 10 mg via ORAL
  Filled 2019-08-07 (×2): qty 2

## 2019-08-07 MED ORDER — TAMSULOSIN HCL 0.4 MG PO CAPS
0.4000 mg | ORAL_CAPSULE | Freq: Every day | ORAL | Status: DC
  Administered 2019-08-08: 09:00:00 0.4 mg via ORAL
  Filled 2019-08-07: qty 1

## 2019-08-07 MED ORDER — VITAMIN B-12 1000 MCG PO TABS
1000.0000 ug | ORAL_TABLET | Freq: Every day | ORAL | Status: DC
  Administered 2019-08-08: 09:00:00 1000 ug via ORAL
  Filled 2019-08-07: qty 1

## 2019-08-07 MED ORDER — ENOXAPARIN SODIUM 40 MG/0.4ML ~~LOC~~ SOLN
40.0000 mg | SUBCUTANEOUS | Status: DC
  Administered 2019-08-07: 40 mg via SUBCUTANEOUS
  Filled 2019-08-07: qty 0.4

## 2019-08-07 MED ORDER — HYDRALAZINE HCL 20 MG/ML IJ SOLN: 10.0000 mg | Freq: Four times a day (QID) | INTRAMUSCULAR | Status: DC | PRN

## 2019-08-07 MED ORDER — ONDANSETRON HCL 4 MG PO TABS: 4.0000 mg | ORAL_TABLET | Freq: Four times a day (QID) | ORAL | Status: DC | PRN

## 2019-08-07 MED ORDER — VITAMIN D 25 MCG (1000 UNIT) PO TABS
1000.0000 [IU] | ORAL_TABLET | Freq: Every day | ORAL | Status: DC
  Administered 2019-08-08: 09:00:00 1000 [IU] via ORAL
  Filled 2019-08-07: qty 1

## 2019-08-07 MED ORDER — TRIAMTERENE-HCTZ 75-50 MG PO TABS
1.0000 | ORAL_TABLET | Freq: Every day | ORAL | Status: DC
  Filled 2019-08-07 (×2): qty 1

## 2019-08-07 MED ORDER — SODIUM CHLORIDE 0.9% FLUSH: 3.0000 mL | Freq: Once | INTRAVENOUS | Status: DC

## 2019-08-07 MED ORDER — ACETAMINOPHEN 325 MG PO TABS: 650.0000 mg | ORAL_TABLET | Freq: Four times a day (QID) | ORAL | Status: DC | PRN

## 2019-08-07 MED ORDER — ACETAMINOPHEN 650 MG RE SUPP: 650.0000 mg | Freq: Four times a day (QID) | RECTAL | Status: DC | PRN

## 2019-08-07 MED ORDER — ONDANSETRON HCL 4 MG/2ML IJ SOLN: 4.0000 mg | Freq: Four times a day (QID) | INTRAMUSCULAR | Status: DC | PRN

## 2019-08-07 MED ORDER — GALANTAMINE HYDROBROMIDE 4 MG PO TABS
4.0000 mg | ORAL_TABLET | Freq: Every day | ORAL | Status: DC
  Administered 2019-08-08: 4 mg via ORAL
  Filled 2019-08-07: qty 1

## 2019-08-07 MED ORDER — POTASSIUM CHLORIDE CRYS ER 20 MEQ PO TBCR
20.0000 meq | EXTENDED_RELEASE_TABLET | Freq: Two times a day (BID) | ORAL | Status: DC
  Administered 2019-08-07 – 2019-08-08 (×2): 20 meq via ORAL
  Filled 2019-08-07 (×2): qty 1

## 2019-08-07 MED ORDER — ASPIRIN EC 81 MG PO TBEC
81.0000 mg | DELAYED_RELEASE_TABLET | Freq: Every day | ORAL | Status: DC
  Administered 2019-08-08: 81 mg via ORAL
  Filled 2019-08-07: qty 1

## 2019-08-07 MED ORDER — IOHEXOL 350 MG/ML SOLN
100.0000 mL | Freq: Once | INTRAVENOUS | Status: AC | PRN
  Administered 2019-08-07: 18:00:00 100 mL via INTRAVENOUS

## 2019-08-07 NOTE — H&P (Signed)
[facility] [place] NAME: Ian Parker    MR#:  [id number]  DATE OF BIRTH:  [birth year]  DATE OF ADMISSION:  08/07/2019  PRIMARY CARE PHYSICIAN: Baxter Hire, MD   REQUESTING/REFERRING PHYSICIAN: Dr. Merlyn Lot  CHIEF COMPLAINT:   Chief Complaint  Patient presents with   Weakness    HISTORY OF PRESENT ILLNESS:  Ian Parker  is a 85 y.o. male with a known history of dementia, BPH, hypertension, history of prostate cancer who presents to the hospital due to altered mental status/just not feeling well.  Patient's wife provides most of the history and as per her patient has been more forgetful and more altered since this past week.  He does have underlying dementia but has become more forgetful over the past week.  Today he was just not feeling very well and therefore she brought him to the ER for further evaluation.  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  He was also noted to be hypertensive, and does have risk factors for underlying possible CVA and therefore hospitalist services were contacted for admission.  Patient denies any headache, numbness, tingling, focal weakness, aphasia or dysarthria, he denies any other associated symptoms.  He denies any recent sick contacts, fever chills cough or any recent travel history.  Patient's COVID-19 test is still pending.  PAST MEDICAL HISTORY:   Past Medical History:  Diagnosis Date   Abnormal EKG    Abnormal PSA    BPH (benign prostatic hyperplasia)    Dysuria    Erectile dysfunction    Gastric ulcer    Goiter    Hemorrhoid    Inflammatory disease of prostate    Irritation of perirectal skin    Prostate cancer ([place])    Rectal burning    Skin lesion     PAST SURGICAL HISTORY:   Past Surgical History:  Procedure Laterality Date   cryoablation of prostate  02/23/2006   ESOPHAGOGASTRODUODENOSCOPY  N/A 03/14/2015   Procedure: ESOPHAGOGASTRODUODENOSCOPY (EGD);  Surgeon: Hulen Luster, MD;  Location: [facility] ENDOSCOPY;  Service: Gastroenterology;  Laterality: N/A;   THYROIDECTOMY     Subtotal    SOCIAL HISTORY:   Social History   Tobacco Use   Smoking status: Never Smoker   Smokeless tobacco: Never Used  Substance Use Topics   Alcohol use: No    Alcohol/week: 0.0 standard drinks    FAMILY HISTORY:   Family History  Problem Relation Age of Onset   Heart attack Mother    Prostate cancer Brother    Brain cancer Father        tumor unknown if cancer   Heart attack Sister    Kidney disease Neg Hx    Kidney cancer Neg Hx    Bladder Cancer Neg Hx     DRUG ALLERGIES:   Allergies  Allergen Reactions   Ace Inhibitors    Peanuts [Peanut Oil]    Penicillins    Sulfa Antibiotics     REVIEW OF SYSTEMS:   Review of Systems  Constitutional: Negative for chills, fever and weight loss.  HENT: Negative for congestion, nosebleeds and tinnitus.   Eyes: Negative for blurred vision, double vision and redness.  Respiratory: Negative for cough, hemoptysis, shortness of breath and wheezing.   Cardiovascular: Negative for chest pain, orthopnea, leg swelling and PND.  Gastrointestinal: Negative for abdominal pain, diarrhea, melena, nausea and vomiting.  Genitourinary: Negative for dysuria, hematuria and urgency.  Musculoskeletal: Negative for falls and joint pain.  Neurological: Negative for dizziness, tingling, sensory change, focal weakness, seizures, weakness and headaches.  Endo/Heme/Allergies: Negative for polydipsia. Does not bruise/bleed easily.  Psychiatric/Behavioral: Negative for depression and memory loss. The patient is not nervous/anxious.   All other systems reviewed and are negative.   MEDICATIONS AT HOME:   Prior to Admission medications   Medication Sig Start Date End Date Taking? Authorizing Provider  cholecalciferol (VITAMIN D) 1000 UNITS tablet Take  1,000 Units by mouth daily. Two tablets once daily   Yes [provider]  galantamine (RAZADYNE) 4 MG tablet Take 4 mg by mouth daily. 05/30/18  Yes [provider]  memantine (NAMENDA) 10 MG tablet Take 10 mg by mouth 2 (two) times daily. 07/06/19  Yes [provider]  potassium chloride SA (K-DUR,KLOR-CON) 20 MEQ tablet Take 20 mEq by mouth 2 (two) times daily. Reported on 12/24/2015   Yes [provider]  tamsulosin (FLOMAX) 0.4 MG CAPS capsule Take 1 capsule (0.4 mg total) by mouth daily. 12/28/18  Yes McGowan, Larene Beach A, PA-C  triamterene-hydrochlorothiazide (MAXZIDE) 75-50 MG per tablet Take 1 tablet by mouth daily.   Yes [provider]  vitamin B-12 (CYANOCOBALAMIN) 1000 MCG tablet Take 1,000 mcg by mouth daily.   Yes [provider]      VITAL SIGNS:  Blood pressure (!) 165/96, pulse 93, temperature 98 F (36.7 C), temperature source Oral, resp. rate 18, height 6\' 1"  (1.854 m), weight 80.7 kg, SpO2 98 %.  PHYSICAL EXAMINATION:  Physical Exam  GENERAL:  85 y.o.-year-old patient lying in the bed with no acute distress.  EYES: Pupils equal, round, reactive to light and accommodation. No scleral icterus. Extraocular muscles intact.  HEENT: Head atraumatic, normocephalic. Oropharynx and nasopharynx clear. No oropharyngeal erythema, moist oral mucosa  NECK:  Supple, no jugular venous distention. No thyroid enlargement, no tenderness.  LUNGS: Normal breath sounds bilaterally, no wheezing, rales, rhonchi. No use of accessory muscles of respiration.  CARDIOVASCULAR: S1, S2 RRR. No murmurs, rubs, gallops, clicks.  ABDOMEN: Soft, nontender, nondistended. Bowel sounds present. No organomegaly or mass.  EXTREMITIES: No pedal edema, cyanosis, or clubbing. + 2 pedal & radial pulses b/l.   NEUROLOGIC: Cranial nerves II through XII are intact. No focal Motor or sensory deficits appreciated b/l PSYCHIATRIC: The patient is alert and oriented x  3. SKIN: No obvious rash, lesion, or ulcer.   LABORATORY PANEL:   CBC Recent Labs  Lab 08/07/19 1624  WBC 5.6  HGB 16.5  HCT 50.9  PLT 189   ------------------------------------------------------------------------------------------------------------------  Chemistries  Recent Labs  Lab 08/07/19 1624  NA 133*  K 3.4*  CL 96*  CO2 28  GLUCOSE 101*  BUN 12  CREATININE 1.11  CALCIUM 9.1   ------------------------------------------------------------------------------------------------------------------  Cardiac Enzymes No results for input(s): TROPONINI in the last 168 hours. ------------------------------------------------------------------------------------------------------------------  RADIOLOGY:  Ct Angio Head W Or Wo Contrast  Result Date: 08/07/2019 CLINICAL DATA:  Confusion, word finding difficulty. Rule out subarachnoid hemorrhage, vasospasm EXAM: CT ANGIOGRAPHY HEAD AND NECK TECHNIQUE: Multidetector CT imaging of the head and neck was performed using the standard protocol during bolus administration of intravenous contrast. Multiplanar CT image reconstructions and MIPs were obtained to evaluate the vascular anatomy. Carotid stenosis measurements (when applicable) are obtained utilizing NASCET criteria, using the distal internal carotid diameter as the denominator. CONTRAST:  124mL OMNIPAQUE IOHEXOL 350 MG/ML SOLN COMPARISON:  CT head 08/07/2019 FINDINGS: CT HEAD  FINDINGS Brain: Patchy hypodensity throughout the cerebral white matter bilaterally is unchanged. Focal chronic infarct in the right corona radiata unchanged. Ventricle size normal. Chronic infarct left cerebellum unchanged Negative for acute infarct, hemorrhage, mass. Vascular: Negative for hyperdense vessel Skull: Negative Sinuses: Negative Orbits: None Review of the MIP images confirms the above findings CTA NECK FINDINGS Aortic arch: Standard branching. Imaged portion shows no evidence of aneurysm or dissection.  No significant stenosis of the major arch vessel origins. Right carotid system: Atherosclerotic plaque in the right carotid bulb narrowing the lumen by less than 25% diameter stenosis. Tortuosity of the distal right internal carotid artery. Left carotid system: Atherosclerotic plaque in the left carotid bulb without significant diameter stenosis. Tortuosity distal left internal carotid artery. Vertebral arteries: Both vertebral arteries patent to the basilar without significant stenosis. Skeleton: Cervical spondylosis.  No acute skeletal abnormality. Other neck: Hypoenhancing nodule left lower thyroid measuring 24 x 20 mm. Upper chest: Mild apical scarring.  No acute infiltrate or mass. Review of the MIP images confirms the above findings CTA HEAD FINDINGS Anterior circulation: Cavernous carotid widely patent bilaterally with minimal atherosclerotic disease. Anterior and middle cerebral arteries widely patent without stenosis or aneurysm. Posterior circulation: Both vertebral arteries patent to the basilar. PICA patent bilaterally. Basilar widely patent. Superior cerebellar and posterior cerebral arteries widely patent. Negative for aneurysm. Venous sinuses: Normal venous enhancement. Anatomic variants: None Review of the MIP images confirms the above findings IMPRESSION: 1. Negative for cerebral aneurysm. Negative for intracranial large vessel occlusion or significant stenosis 2. Mild atherosclerotic disease in the carotid bulb bilaterally without flow limiting stenosis. Both vertebral arteries widely patent. 3. Left thyroid nodule 24 x 20 mm. Recommend elective outpatient thyroid ultrasound for further evaluation. Electronically Signed   By: Franchot Gallo M.D.   On: 08/07/2019 19:00   Ct Head Wo Contrast  Result Date: 08/07/2019 CLINICAL DATA:  Dizziness and weakness. EXAM: CT HEAD WITHOUT CONTRAST TECHNIQUE: Contiguous axial images were obtained from the base of the skull through the vertex without  intravenous contrast. COMPARISON:  CT head dated February 23, 2018. FINDINGS: Brain: Likely chronic infarct in the right basal ganglia measuring CSF density, new since 2019. No evidence of hemorrhage, hydrocephalus, extra-axial collection or mass lesion/mass effect. Vascular: Atherosclerotic vascular calcification of the carotid siphons. No hyperdense vessel. Skull: Normal. Negative for fracture or focal lesion. Sinuses/Orbits: No acute finding. Other: None. IMPRESSION: 1.  No acute intracranial abnormality. 2. 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 Electronically Signed   By: Titus Dubin M.D.   On: 08/07/2019 17:02   Ct Angio Neck W And/or Wo Contrast  Result Date: 08/07/2019 CLINICAL DATA:  Confusion, word finding difficulty. Rule out subarachnoid hemorrhage, vasospasm EXAM: CT ANGIOGRAPHY HEAD AND NECK TECHNIQUE: Multidetector CT imaging of the head and neck was performed using the standard protocol during bolus administration of intravenous contrast. Multiplanar CT image reconstructions and MIPs were obtained to evaluate the vascular anatomy. Carotid stenosis measurements (when applicable) are obtained utilizing NASCET criteria, using the distal internal carotid diameter as the denominator. CONTRAST:  15mL OMNIPAQUE IOHEXOL 350 MG/ML SOLN COMPARISON:  CT head 08/07/2019 FINDINGS: CT HEAD FINDINGS Brain: Patchy hypodensity throughout the cerebral white matter bilaterally is unchanged. Focal chronic infarct in the right corona radiata unchanged. Ventricle size normal. Chronic infarct left cerebellum unchanged Negative for acute infarct, hemorrhage, mass. Vascular: Negative for hyperdense vessel Skull: Negative Sinuses: Negative Orbits: None Review of the MIP images confirms the above findings  CTA NECK FINDINGS Aortic arch: Standard branching. Imaged portion shows no evidence of aneurysm or dissection. No significant stenosis of the major arch vessel origins. Right carotid  system: Atherosclerotic plaque in the right carotid bulb narrowing the lumen by less than 25% diameter stenosis. Tortuosity of the distal right internal carotid artery. Left carotid system: Atherosclerotic plaque in the left carotid bulb without significant diameter stenosis. Tortuosity distal left internal carotid artery. Vertebral arteries: Both vertebral arteries patent to the basilar without significant stenosis. Skeleton: Cervical spondylosis.  No acute skeletal abnormality. Other neck: Hypoenhancing nodule left lower thyroid measuring 24 x 20 mm. Upper chest: Mild apical scarring.  No acute infiltrate or mass. Review of the MIP images confirms the above findings CTA HEAD FINDINGS Anterior circulation: Cavernous carotid widely patent bilaterally with minimal atherosclerotic disease. Anterior and middle cerebral arteries widely patent without stenosis or aneurysm. Posterior circulation: Both vertebral arteries patent to the basilar. PICA patent bilaterally. Basilar widely patent. Superior cerebellar and posterior cerebral arteries widely patent. Negative for aneurysm. Venous sinuses: Normal venous enhancement. Anatomic variants: None Review of the MIP images confirms the above findings IMPRESSION: 1. Negative for cerebral aneurysm. Negative for intracranial large vessel occlusion or significant stenosis 2. Mild atherosclerotic disease in the carotid bulb bilaterally without flow limiting stenosis. Both vertebral arteries widely patent. 3. Left thyroid nodule 24 x 20 mm. Recommend elective outpatient thyroid ultrasound for further evaluation. Electronically Signed   By: Franchot Gallo M.D.   On: 08/07/2019 19:00   Dg Chest Portable 1 View  Result Date: 08/07/2019 CLINICAL DATA:  Dizziness and weakness. EXAM: PORTABLE CHEST 1 VIEW COMPARISON:  Chest x-ray dated May 05, 2013. FINDINGS: Normal heart size. Slightly more pronounced convexity of the ascending aorta. Normal pulmonary vascularity. No focal  consolidation, pleural effusion, or pneumothorax. No acute osseous abnormality. IMPRESSION: 1.  No active cardiopulmonary disease. 2. 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 Recommend CTA chest for further evaluation. Electronically Signed   By: Titus Dubin M.D.   On: 08/07/2019 17:57     IMPRESSION AND PLAN:   85 y.o. male with a known history of dementia, BPH, hypertension, history of prostate cancer who presents to the hospital due to altered mental status/just not feeling well.  Patient's wife provides most of the history and as per her patient has been more forgetful and more altered since this past week.  1.  Altered mental status/confusion- suspected to be secondary to worsening dementia.  Patient did undergo a CT of the head which showed an old infarct and patient has no previous history of CVA. - We will admit the patient under observation, get MRI of the brain, echocardiogram.  Patient CTA of the head and neck shows no evidence of intracranial stenosis or any hemodynamically significant carotid stenosis. -Continue aspirin, will get PT evaluation.  Check lipid profile.  2.  Essential hypertension-continue triamterene/HCTZ, will add some as needed IV hydralazine.  3.  Dementia- continue Namenda, galantamine.  4.  BPH-continue Flomax.  No urinary retention.    All the records are reviewed and case discussed with ED provider. Management plans discussed with the patient, family and they are in agreement.  CODE STATUS: Full code  TOTAL TIME TAKING CARE OF THIS PATIENT: 40 minutes.    Henreitta Leber M.D on 08/07/2019 at 7:09 PM  Between 7am to 6pm - Pager - [phone]  After 6pm go to [email or web address] - password [email or web address]  [facility]  Office  [phone]  CC: Primary  care physician; Baxter Hire, MD

## 2019-08-07 NOTE — ED Provider Notes (Signed)
[facility] Emergency Department Provider Note    First MD Initiated Contact with Patient 08/07/19 1729     (approximate)  I have reviewed the triage vital signs and the nursing notes.   HISTORY  Chief Complaint Weakness    HPI Ian Parker is a 85 y.o. male   below listed past medical history presents the ER for 1 week of confusion having difficulty finding words and completing his thoughts.  Not having difficulty walking.  Wife states that the symptoms changed roughly 1 week ago.  Denies any history of dementia or stroke.  Does have a history of hypertension.  Does not smoke.  Denies any chest pain or shortness of breath.  No nausea or vomiting.   Past Medical History:  Diagnosis Date  . Abnormal EKG   . Abnormal PSA   . BPH (benign prostatic hyperplasia)   . Dysuria   . Erectile dysfunction   . Gastric ulcer   . Goiter   . Hemorrhoid   . Inflammatory disease of prostate   . Irritation of perirectal skin   . Prostate cancer ([place])   . Rectal burning   . Skin lesion    Family History  Problem Relation Age of Onset  . Heart attack Mother   . Prostate cancer Brother   . Brain cancer Father        tumor unknown if cancer  . Heart attack Sister   . Kidney disease Neg Hx   . Kidney cancer Neg Hx   . Bladder Cancer Neg Hx    Past Surgical History:  Procedure Laterality Date  . cryoablation of prostate  02/23/2006  . ESOPHAGOGASTRODUODENOSCOPY N/A 03/14/2015   Procedure: ESOPHAGOGASTRODUODENOSCOPY (EGD);  Surgeon: Hulen Luster, MD;  Location: [facility] ENDOSCOPY;  Service: Gastroenterology;  Laterality: N/A;  . THYROIDECTOMY     Subtotal   Patient Active Problem List   Diagnosis Date Noted  . Anemia due to vitamin B12 deficiency 07/12/2015  . Prostate cancer ([place]) 06/26/2015  . ED (erectile dysfunction) of organic origin 06/26/2015  . Hemorrhoids 06/26/2015  . Benign prostatic hypertrophy with lower urinary tract symptoms (LUTS) 06/26/2015  .  H/O nutritional disorder 08/21/2014  . Peripheral neuropathy ([place]) 08/21/2014  . H/O malignant neoplasm of prostate 08/21/2014  . Benign essential HTN 04/25/2014      Prior to Admission medications   Medication Sig Start Date End Date Taking? Authorizing Provider  amLODipine (NORVASC) 5 MG tablet Take 5 mg by mouth daily.    [provider]  cholecalciferol (VITAMIN D) 1000 UNITS tablet Take 1,000 Units by mouth daily. Two tablets once daily    [provider]  cyanocobalamin (,VITAMIN B-12,) 1000 MCG/ML injection Inject into the muscle. 10/24/14   [provider]  fluticasone Asencion Islam) 50 MCG/ACT nasal spray  08/03/15   [provider]  galantamine (RAZADYNE) 4 MG tablet Take 4 mg by mouth daily. 05/30/18   [provider]  hydrocortisone (ANUSOL-HC) 2.5 % rectal cream Place 1 application rectally 2 (two) times daily. 12/25/16   Zara Council A, PA-C  lidocaine (XYLOCAINE) 2 % jelly Apply topically. Reported on 12/24/2015 04/30/15   [provider]  magnesium oxide (MAG-OX) 400 MG tablet TAKE 1 TABLET BY MOUTH ONCE DAILY 06/17/18   [provider]  potassium chloride SA (K-DUR,KLOR-CON) 20 MEQ tablet Take 20 mEq by mouth daily. Reported on 12/24/2015    [provider]  psyllium (METAMUCIL) 58.6 % powder Take 1 packet by  mouth 2 (two) times daily.    [provider]  sildenafil (REVATIO) 20 MG tablet Take 3 to 5 tablets two hours before intercouse on an empty stomach.  Do not take with nitrates. 06/23/17   Zara Council A, PA-C  tamsulosin (FLOMAX) 0.4 MG CAPS capsule Take 1 capsule (0.4 mg total) by mouth daily. 12/28/18   Zara Council A, PA-C  triamterene-hydrochlorothiazide (MAXZIDE) 75-50 MG per tablet Take 1 tablet by mouth daily.    [provider]    Allergies Ace inhibitors, Peanuts [peanut oil], Penicillins, and Sulfa antibiotics    Social History Social History   Tobacco Use  .  Smoking status: Never Smoker  . Smokeless tobacco: Never Used  Substance Use Topics  . Alcohol use: No    Alcohol/week: 0.0 standard drinks  . Drug use: No    Review of Systems Patient denies headaches, rhinorrhea, blurry vision, numbness, shortness of breath, chest pain, edema, cough, abdominal pain, nausea, vomiting, diarrhea, dysuria, fevers, rashes or hallucinations unless otherwise stated above in HPI. ____________________________________________   PHYSICAL EXAM:  VITAL SIGNS: Vitals:   08/07/19 1613  BP: (!) 165/96  Pulse: 93  Resp: 18  Temp: 98 F (36.7 C)  SpO2: 98%    Constitutional: Alert and oriented.  Eyes: Conjunctivae are normal.  Head: Atraumatic. Nose: No congestion/rhinnorhea. Mouth/Throat: Mucous membranes are moist.   Neck: No stridor. Painless ROM.  Cardiovascular: Normal rate, regular rhythm. Grossly normal heart sounds.  Good peripheral circulation. Respiratory: Normal respiratory effort.  No retractions. Lungs CTAB. Gastrointestinal: Soft and nontender. No distention. No abdominal bruits. No CVA tenderness. Genitourinary:  Musculoskeletal: No lower extremity tenderness nor edema.  No joint effusions. Neurologic:  CN- intact.  No facial droop, Normal FNF.  Normal heel to shin.  Sensation intact bilaterally. Normal speech and language. No gross focal neurologic deficits are appreciated. No gait instability. Skin:  Skin is warm, dry and intact. No rash noted. Psychiatric: Mood and affect are normal. Speech and behavior are normal.  ____________________________________________   LABS (all labs ordered are listed, but only abnormal results are displayed)  Results for orders placed or performed during the hospital encounter of 08/07/19 (from the past 24 hour(s))  Basic metabolic panel     Status: Abnormal   Collection Time: 08/07/19  4:24 PM  Result Value Ref Range   Sodium 133 (L) 135 - 145 mmol/L   Potassium 3.4 (L) 3.5 - 5.1 mmol/L   Chloride 96  (L) 98 - 111 mmol/L   CO2 28 22 - 32 mmol/L   Glucose, Bld 101 (H) 70 - 99 mg/dL   BUN 12 8 - 23 mg/dL   Creatinine, Ser 1.11 0.61 - 1.24 mg/dL   Calcium 9.1 8.9 - 10.3 mg/dL   GFR calc non Af Amer 60 (L) >60 mL/min   GFR calc Af Amer >60 >60 mL/min   Anion gap 9 5 - 15  CBC     Status: Abnormal   Collection Time: 08/07/19  4:24 PM  Result Value Ref Range   WBC 5.6 4.0 - 10.5 K/uL   RBC 6.36 (H) 4.22 - 5.81 MIL/uL   Hemoglobin 16.5 13.0 - 17.0 g/dL   HCT 50.9 39.0 - 52.0 %   MCV 80.0 80.0 - 100.0 fL   MCH 25.9 (L) 26.0 - 34.0 pg   MCHC 32.4 30.0 - 36.0 g/dL   RDW 13.3 11.5 - 15.5 %   Platelets 189 150 - 400 K/uL   nRBC 0.0 0.0 -  0.2 %  Urinalysis, Complete w Microscopic     Status: Abnormal   Collection Time: 08/07/19  4:24 PM  Result Value Ref Range   Color, Urine YELLOW (A) YELLOW   APPearance CLEAR (A) CLEAR   Specific Gravity, Urine 1.010 1.005 - 1.030   pH 7.0 5.0 - 8.0   Glucose, UA NEGATIVE NEGATIVE mg/dL   Hgb urine dipstick NEGATIVE NEGATIVE   Bilirubin Urine NEGATIVE NEGATIVE   Ketones, ur NEGATIVE NEGATIVE mg/dL   Protein, ur NEGATIVE NEGATIVE mg/dL   Nitrite NEGATIVE NEGATIVE   Leukocytes,Ua SMALL (A) NEGATIVE   RBC / HPF 0-5 0 - 5 RBC/hpf   WBC, UA 0-5 0 - 5 WBC/hpf   Bacteria, UA NONE SEEN NONE SEEN   Squamous Epithelial / LPF 0-5 0 - 5  Troponin I (High Sensitivity)     Status: None   Collection Time: 08/07/19  4:24 PM  Result Value Ref Range   Troponin I (High Sensitivity) 8 <18 ng/L   ____________________________________________  EKG My review and personal interpretation at Time: 16:50   Indication: ams  Rate: 70  Rhythm: sinus Axis: normal Other: normal intervals with poor r wave progression ____________________________________________  RADIOLOGY  I personally reviewed all radiographic images ordered to evaluate for the above acute complaints and reviewed radiology reports and findings.  These findings were personally discussed with the  patient.  Please see medical record for radiology report.  ____________________________________________   PROCEDURES  Procedure(s) performed:  Procedures    Critical Care performed: no ____________________________________________   INITIAL IMPRESSION / ASSESSMENT AND PLAN / ED COURSE  Pertinent labs & imaging results that were available during my care of the patient were reviewed by me and considered in my medical decision making (see chart for details).   DDX: cva, tia, hypoglycemia, dehydration, electrolyte abnormality, dissection, sepsis   JOSIAH MANTEY is a 85 y.o. who presents to the ED with symptoms as described above.  Does not have any focal or lateralizing weakness on exam does seem to be a slightly repetitive may be having difficulty communicating.  Possible component of a aphasia causing the patient's symptoms.  CT imaging ordered for by differential does show evidence of previous infarct.  Likely chronic.  Blood work is reassuring.  Does have some hypertension.  Chest x-ray also with possible ascending aortic aneurysm.  Will order CTA to further evaluate nevertheless given his age and risk factors do feel hospitalization for altered mental status CVA work-up clinically indicated.  Have discussed with the patient and available family all diagnostics and treatments performed thus far and all questions were answered to the best of my ability. The patient demonstrates understanding and agreement with plan.      The patient was evaluated in Emergency Department today for the symptoms described in the history of present illness. He/she was evaluated in the context of the global COVID-19 pandemic, which necessitated consideration that the patient might be at risk for infection with the SARS-CoV-2 virus that causes COVID-19. Institutional protocols and algorithms that pertain to the evaluation of patients at risk for COVID-19 are in a state of rapid change based on information  released by regulatory bodies including the CDC and federal and state organizations. These policies and algorithms were followed during the patient's care in the ED.  As part of my medical decision making, I reviewed the following data within the [place] notes reviewed and incorporated, Labs reviewed, notes from prior ED visits and  Controlled  Substance Database   ____________________________________________   FINAL CLINICAL IMPRESSION(S) / ED DIAGNOSES  Final diagnoses:  Cerebrovascular accident (CVA), unspecified mechanism ([place])      NEW MEDICATIONS STARTED DURING THIS VISIT:  New Prescriptions   No medications on file     Note:  This document was prepared using Dragon voice recognition software and may include unintentional dictation errors.    Merlyn Lot, MD 08/07/19 [phone]

## 2019-08-07 NOTE — ED Triage Notes (Signed)
Pt to the er with a slow but steady gait who says he just does not feel right. Pt states he is a little weak and some dizziness. Pt reports a change in his vision over the last few days. Pt was able to state day of the week but stated it was 2021. Pt is able to answer questions but hard to articulate other than "I just don't feel right". No hx of stroke.

## 2019-08-07 NOTE — ED Notes (Addendum)
ED TO INPATIENT HANDOFF REPORT  ED Nurse Name and Phone #:   Gershon Mussel RN  [phone]  S Name/Age/Gender Ian Parker 85 y.o. male Room/Bed: ED16A/ED16A  Code Status   Code Status: Full Code  Home/SNF/Other Home Patient oriented to: self, place, time and situation Is this baseline? Yes   Triage Complete: Triage complete  Chief Complaint BP elevated/balance problems  Triage Note Pt to the er with a slow but steady gait who says he just does not feel right. Pt states he is a little weak and some dizziness. Pt reports a change in his vision over the last few days. Pt was able to state day of the week but stated it was 2021. Pt is able to answer questions but hard to articulate other than "I just don't feel right". No hx of stroke.   Allergies Allergies  Allergen Reactions  . Ace Inhibitors   . Peanuts [Peanut Oil]   . Penicillins   . Sulfa Antibiotics     Level of Care/Admitting Diagnosis ED Disposition    ED Disposition Condition [facility]: [place] [[id number]]  Level of Care: Med-Surg [16]  Covid Evaluation: Asymptomatic Screening Protocol (No Symptoms)  Diagnosis: Altered mental status [780.97.ICD-9-CM]  Admitting Physician: Henreitta Leber [id number]  Attending Physician: Henreitta Leber [id number]  PT Class (Do Not Modify): Observation [104]  PT Acc Code (Do Not Modify): Observation [10022]       B Medical/Surgery History Past Medical History:  Diagnosis Date  . Abnormal EKG   . Abnormal PSA   . BPH (benign prostatic hyperplasia)   . Dysuria   . Erectile dysfunction   . Gastric ulcer   . Goiter   . Hemorrhoid   . Inflammatory disease of prostate   . Irritation of perirectal skin   . Prostate cancer ([place])   . Rectal burning   . Skin lesion    Past Surgical History:  Procedure Laterality Date  . cryoablation of prostate  02/23/2006  . ESOPHAGOGASTRODUODENOSCOPY N/A 03/14/2015   Procedure:  ESOPHAGOGASTRODUODENOSCOPY (EGD);  Surgeon: Hulen Luster, MD;  Location: [facility] ENDOSCOPY;  Service: Gastroenterology;  Laterality: N/A;  . THYROIDECTOMY     Subtotal     A IV Location/Drains/Wounds Patient Lines/Drains/Airways Status   Active Line/Drains/Airways    Name:   Placement date:   Placement time:   Site:   Days:   Peripheral IV 08/07/19 Left Antecubital   08/07/19    1814    Antecubital   less than 1          Intake/Output Last 24 hours  Intake/Output Summary (Last 24 hours) at 08/07/2019 2057 Last data filed at 08/07/2019 2031 Gross per 24 hour  Intake -  Output 600 ml  Net -600 ml    Labs/Imaging Results for orders placed or performed during the hospital encounter of 08/07/19 (from the past 48 hour(s))  Basic metabolic panel     Status: Abnormal   Collection Time: 08/07/19  4:24 PM  Result Value Ref Range   Sodium 133 (L) 135 - 145 mmol/L   Potassium 3.4 (L) 3.5 - 5.1 mmol/L   Chloride 96 (L) 98 - 111 mmol/L   CO2 28 22 - 32 mmol/L   Glucose, Bld 101 (H) 70 - 99 mg/dL   BUN 12 8 - 23 mg/dL   Creatinine, Ser 1.11 0.61 - 1.24 mg/dL   Calcium 9.1 8.9 - 10.3 mg/dL   GFR calc non  Af Amer 60 (L) >60 mL/min   GFR calc Af Amer >60 >60 mL/min   Anion gap 9 5 - 15    Comment: Performed at [facility], [address] [place], [place] [address]  CBC     Status: Abnormal   Collection Time: 08/07/19  4:24 PM  Result Value Ref Range   WBC 5.6 4.0 - 10.5 K/uL   RBC 6.36 (H) 4.22 - 5.81 MIL/uL   Hemoglobin 16.5 13.0 - 17.0 g/dL   HCT 50.9 39.0 - 52.0 %   MCV 80.0 80.0 - 100.0 fL   MCH 25.9 (L) 26.0 - 34.0 pg   MCHC 32.4 30.0 - 36.0 g/dL   RDW 13.3 11.5 - 15.5 %   Platelets 189 150 - 400 K/uL   nRBC 0.0 0.0 - 0.2 %    Comment: Performed at [facility], [address] [place], [place] [address]  Urinalysis, Complete w Microscopic     Status: Abnormal   Collection Time: 08/07/19  4:24 PM  Result Value Ref Range   Color, Urine YELLOW (A) YELLOW    APPearance CLEAR (A) CLEAR   Specific Gravity, Urine 1.010 1.005 - 1.030   pH 7.0 5.0 - 8.0   Glucose, UA NEGATIVE NEGATIVE mg/dL   Hgb urine dipstick NEGATIVE NEGATIVE   Bilirubin Urine NEGATIVE NEGATIVE   Ketones, ur NEGATIVE NEGATIVE mg/dL   Protein, ur NEGATIVE NEGATIVE mg/dL   Nitrite NEGATIVE NEGATIVE   Leukocytes,Ua SMALL (A) NEGATIVE   RBC / HPF 0-5 0 - 5 RBC/hpf   WBC, UA 0-5 0 - 5 WBC/hpf   Bacteria, UA NONE SEEN NONE SEEN   Squamous Epithelial / LPF 0-5 0 - 5    Comment: Performed at [facility], [place], [place] [address]  Troponin I (High Sensitivity)     Status: None   Collection Time: 08/07/19  4:24 PM  Result Value Ref Range   Troponin I (High Sensitivity) 8 <18 ng/L    Comment: (NOTE) Elevated high sensitivity troponin I (hsTnI) values and significant  changes across serial measurements may suggest ACS but many other  chronic and acute conditions are known to elevate hsTnI results.  Refer to the "Links" section for chest pain algorithms and additional  guidance. Performed at [facility], [place], [place] [address]   Troponin I (High Sensitivity)     Status: None   Collection Time: 08/07/19  6:23 PM  Result Value Ref Range   Troponin I (High Sensitivity) 9 <18 ng/L    Comment: (NOTE) Elevated high sensitivity troponin I (hsTnI) values and significant  changes across serial measurements may suggest ACS but many other  chronic and acute conditions are known to elevate hsTnI results.  Refer to the "Links" section for chest pain algorithms and additional  guidance. Performed at [facility], [place], [place] [address]    Ct Angio Head W Or Wo Contrast  Result Date: 08/07/2019 CLINICAL DATA:  Confusion, word finding difficulty. Rule out subarachnoid hemorrhage, vasospasm EXAM: CT ANGIOGRAPHY HEAD AND NECK TECHNIQUE: Multidetector CT imaging of the head and neck was performed using  the standard protocol during bolus administration of intravenous contrast. Multiplanar CT image reconstructions and MIPs were obtained to evaluate the vascular anatomy. Carotid stenosis measurements (when applicable) are obtained utilizing NASCET criteria, using the distal internal carotid diameter as the denominator. CONTRAST:  134mL OMNIPAQUE IOHEXOL 350 MG/ML SOLN COMPARISON:  CT head 08/07/2019  FINDINGS: CT HEAD FINDINGS Brain: Patchy hypodensity throughout the cerebral white matter bilaterally is unchanged. Focal chronic infarct in the right corona radiata unchanged. Ventricle size normal. Chronic infarct left cerebellum unchanged Negative for acute infarct, hemorrhage, mass. Vascular: Negative for hyperdense vessel Skull: Negative Sinuses: Negative Orbits: None Review of the MIP images confirms the above findings CTA NECK FINDINGS Aortic arch: Standard branching. Imaged portion shows no evidence of aneurysm or dissection. No significant stenosis of the major arch vessel origins. Right carotid system: Atherosclerotic plaque in the right carotid bulb narrowing the lumen by less than 25% diameter stenosis. Tortuosity of the distal right internal carotid artery. Left carotid system: Atherosclerotic plaque in the left carotid bulb without significant diameter stenosis. Tortuosity distal left internal carotid artery. Vertebral arteries: Both vertebral arteries patent to the basilar without significant stenosis. Skeleton: Cervical spondylosis.  No acute skeletal abnormality. Other neck: Hypoenhancing nodule left lower thyroid measuring 24 x 20 mm. Upper chest: Mild apical scarring.  No acute infiltrate or mass. Review of the MIP images confirms the above findings CTA HEAD FINDINGS Anterior circulation: Cavernous carotid widely patent bilaterally with minimal atherosclerotic disease. Anterior and middle cerebral arteries widely patent without stenosis or aneurysm. Posterior circulation: Both vertebral arteries patent  to the basilar. PICA patent bilaterally. Basilar widely patent. Superior cerebellar and posterior cerebral arteries widely patent. Negative for aneurysm. Venous sinuses: Normal venous enhancement. Anatomic variants: None Review of the MIP images confirms the above findings IMPRESSION: 1. Negative for cerebral aneurysm. Negative for intracranial large vessel occlusion or significant stenosis 2. Mild atherosclerotic disease in the carotid bulb bilaterally without flow limiting stenosis. Both vertebral arteries widely patent. 3. Left thyroid nodule 24 x 20 mm. Recommend elective outpatient thyroid ultrasound for further evaluation. Electronically Signed   By: Franchot Gallo M.D.   On: 08/07/2019 19:00   Ct Head Wo Contrast  Result Date: 08/07/2019 CLINICAL DATA:  Dizziness and weakness. EXAM: CT HEAD WITHOUT CONTRAST TECHNIQUE: Contiguous axial images were obtained from the base of the skull through the vertex without intravenous contrast. COMPARISON:  CT head dated February 23, 2018. FINDINGS: Brain: Likely chronic infarct in the right basal ganglia measuring CSF density, new since 2019. No evidence of hemorrhage, hydrocephalus, extra-axial collection or mass lesion/mass effect. Vascular: Atherosclerotic vascular calcification of the carotid siphons. No hyperdense vessel. Skull: Normal. Negative for fracture or focal lesion. Sinuses/Orbits: No acute finding. Other: None. IMPRESSION: 1.  No acute intracranial abnormality. 2. 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 Electronically Signed   By: Titus Dubin M.D.   On: 08/07/2019 17:02   Ct Angio Neck W And/or Wo Contrast  Result Date: 08/07/2019 CLINICAL DATA:  Confusion, word finding difficulty. Rule out subarachnoid hemorrhage, vasospasm EXAM: CT ANGIOGRAPHY HEAD AND NECK TECHNIQUE: Multidetector CT imaging of the head and neck was performed using the standard protocol during bolus administration of intravenous contrast.  Multiplanar CT image reconstructions and MIPs were obtained to evaluate the vascular anatomy. Carotid stenosis measurements (when applicable) are obtained utilizing NASCET criteria, using the distal internal carotid diameter as the denominator. CONTRAST:  13mL OMNIPAQUE IOHEXOL 350 MG/ML SOLN COMPARISON:  CT head 08/07/2019 FINDINGS: CT HEAD FINDINGS Brain: Patchy hypodensity throughout the cerebral white matter bilaterally is unchanged. Focal chronic infarct in the right corona radiata unchanged. Ventricle size normal. Chronic infarct left cerebellum unchanged Negative for acute infarct, hemorrhage, mass. Vascular: Negative for hyperdense vessel Skull: Negative Sinuses: Negative Orbits: None Review of the MIP images confirms  the above findings CTA NECK FINDINGS Aortic arch: Standard branching. Imaged portion shows no evidence of aneurysm or dissection. No significant stenosis of the major arch vessel origins. Right carotid system: Atherosclerotic plaque in the right carotid bulb narrowing the lumen by less than 25% diameter stenosis. Tortuosity of the distal right internal carotid artery. Left carotid system: Atherosclerotic plaque in the left carotid bulb without significant diameter stenosis. Tortuosity distal left internal carotid artery. Vertebral arteries: Both vertebral arteries patent to the basilar without significant stenosis. Skeleton: Cervical spondylosis.  No acute skeletal abnormality. Other neck: Hypoenhancing nodule left lower thyroid measuring 24 x 20 mm. Upper chest: Mild apical scarring.  No acute infiltrate or mass. Review of the MIP images confirms the above findings CTA HEAD FINDINGS Anterior circulation: Cavernous carotid widely patent bilaterally with minimal atherosclerotic disease. Anterior and middle cerebral arteries widely patent without stenosis or aneurysm. Posterior circulation: Both vertebral arteries patent to the basilar. PICA patent bilaterally. Basilar widely patent. Superior  cerebellar and posterior cerebral arteries widely patent. Negative for aneurysm. Venous sinuses: Normal venous enhancement. Anatomic variants: None Review of the MIP images confirms the above findings IMPRESSION: 1. Negative for cerebral aneurysm. Negative for intracranial large vessel occlusion or significant stenosis 2. Mild atherosclerotic disease in the carotid bulb bilaterally without flow limiting stenosis. Both vertebral arteries widely patent. 3. Left thyroid nodule 24 x 20 mm. Recommend elective outpatient thyroid ultrasound for further evaluation. Electronically Signed   By: Franchot Gallo M.D.   On: 08/07/2019 19:00   Dg Chest Portable 1 View  Result Date: 08/07/2019 CLINICAL DATA:  Dizziness and weakness. EXAM: PORTABLE CHEST 1 VIEW COMPARISON:  Chest x-ray dated May 05, 2013. FINDINGS: Normal heart size. Slightly more pronounced convexity of the ascending aorta. Normal pulmonary vascularity. No focal consolidation, pleural effusion, or pneumothorax. No acute osseous abnormality. IMPRESSION: 1.  No active cardiopulmonary disease. 2. 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 Recommend CTA chest for further evaluation. Electronically Signed   By: Titus Dubin M.D.   On: 08/07/2019 17:57   Ct Angio Chest Aorta W And/or Wo Contrast  Result Date: 08/07/2019 CLINICAL DATA:  85 year old male with acute chest pain. EXAM: CT ANGIOGRAPHY CHEST WITH CONTRAST TECHNIQUE: Multidetector CT imaging of the chest was performed using the standard protocol during bolus administration of intravenous contrast. Multiplanar CT image reconstructions and MIPs were obtained to evaluate the vascular anatomy. CONTRAST:  13mL OMNIPAQUE IOHEXOL 350 MG/ML SOLN COMPARISON:  08/07/2019 radiograph 07/24/2013 thyroid ultrasound and other studies. FINDINGS: Cardiovascular: This is a technically borderline study. No pulmonary emboli are identified. Mild cardiomegaly noted. Coronary artery and aortic  atherosclerotic calcifications are present. There is no evidence of thoracic aortic aneurysm or definite dissection. No pericardial effusion. Mediastinum/Nodes: LEFT thyroid nodules are again noted. No esophageal or tracheal abnormalities noted. No mediastinal mass or enlarged lymph nodes. Lungs/Pleura: Minimal bibasilar atelectasis/scarring noted. There is no evidence of airspace disease, consolidation, nodule, mass, pleural effusion or pneumothorax. Upper Abdomen: Probable mild hepatic steatosis noted. No acute abnormality. Musculoskeletal: No acute abnormality. Thoracic syndesmophytes are noted. Review of the MIP images confirms the above findings. IMPRESSION: 1. No evidence of pulmonary emboli or thoracic aortic aneurysm/dissection. 2. Minimal bibasilar atelectasis/scarring. 3. Probable hepatic steatosis. 4. Mild cardiomegaly and coronary artery disease. 5.  Aortic Atherosclerosis (ICD10-I70.0). Electronically Signed   By: Margarette Canada M.D.   On: 08/07/2019 19:12    Pending Labs [employer] (From admission, onward)    Start     Ordered  08/14/19 0500  Creatinine, serum  (enoxaparin (LOVENOX)    CrCl >/= 30 ml/min)  Weekly,   STAT    Comments: while on enoxaparin therapy    08/07/19 2034   08/08/19 [id number]  Basic metabolic panel  Tomorrow morning,   STAT     08/07/19 2034   08/08/19 0500  CBC  Tomorrow morning,   STAT     08/07/19 2034   08/07/19 2035  CBC  (enoxaparin (LOVENOX)    CrCl >/= 30 ml/min)  Once,   STAT    Comments: Baseline for enoxaparin therapy IF NOT ALREADY DRAWN.  Notify MD if PLT < 100 K.    08/07/19 2034   08/07/19 2035  Creatinine, serum  (enoxaparin (LOVENOX)    CrCl >/= 30 ml/min)  Once,   STAT    Comments: Baseline for enoxaparin therapy IF NOT ALREADY DRAWN.    08/07/19 2034   08/07/19 1730  SARS CORONAVIRUS 2 (TAT 6-24 HRS) Nasopharyngeal Nasopharyngeal Swab  (Asymptomatic/Tier 2 Patients Labs)  ONCE - STAT,   STAT    Question Answer Comment  Is this test for  diagnosis or screening Screening   Symptomatic for COVID-19 as defined by CDC No   Hospitalized for COVID-19 No   Admitted to ICU for COVID-19 No   Previously tested for COVID-19 No   Resident in a congregate (group) care setting Unknown   Employed in healthcare setting Unknown      08/07/19 1729          Vitals/Pain Today's Vitals   08/07/19 1613 08/07/19 1614 08/07/19 2033  BP: (!) 165/96  (!) 169/102  Pulse: 93  66  Resp: 18  18  Temp: 98 F (36.7 C)    TempSrc: Oral    SpO2: 98%  98%  Weight:  80.7 kg   Height:  6\' 1"  (1.854 m)   PainSc:  0-No pain 0-No pain    Isolation Precautions No active isolations  Medications Medications  sodium chloride flush (NS) 0.9 % injection 3 mL (has no administration in time range)  triamterene-hydrochlorothiazide (MAXZIDE) 75-50 MG per tablet 1 tablet (has no administration in time range)  galantamine (RAZADYNE) tablet 4 mg (has no administration in time range)  memantine (NAMENDA) tablet 10 mg (has no administration in time range)  tamsulosin (FLOMAX) capsule 0.4 mg (has no administration in time range)  vitamin B-12 (CYANOCOBALAMIN) tablet 1,000 mcg (has no administration in time range)  cholecalciferol (VITAMIN D) tablet 1,000 Units (has no administration in time range)  potassium chloride SA (KLOR-CON) CR tablet 20 mEq (has no administration in time range)  acetaminophen (TYLENOL) tablet 650 mg (has no administration in time range)    Or  acetaminophen (TYLENOL) suppository 650 mg (has no administration in time range)  ondansetron (ZOFRAN) tablet 4 mg (has no administration in time range)    Or  ondansetron (ZOFRAN) injection 4 mg (has no administration in time range)  enoxaparin (LOVENOX) injection 40 mg (has no administration in time range)  aspirin EC tablet 81 mg (has no administration in time range)  hydrALAZINE (APRESOLINE) injection 10 mg (has no administration in time range)  iohexol (OMNIPAQUE) 350 MG/ML injection  100 mL (100 mLs Intravenous Contrast Given 08/07/19 1825)    Mobility walks Low fall risk   Focused Assessments Cardiac Assessment Handoff:    Lab Results  Component Value Date   TROPONINI < 0.02 05/05/2013   No results found for: DDIMER Does the Patient currently have chest pain?  No     R Recommendations: See Admitting Provider Note  Report given to: Estill Bamberg RN on 1C  Additional Notes:

## 2019-08-07 NOTE — ED Notes (Signed)
Patient transported to MRI 

## 2019-08-08 ENCOUNTER — Observation Stay (HOSPITAL_BASED_OUTPATIENT_CLINIC_OR_DEPARTMENT_OTHER)
Admit: 2019-08-08 | Discharge: 2019-08-08 | Disposition: A | Discharge status: Home / Self Care | Payer: Medicare Other | Attending: Specialist | Admitting: Specialist

## 2019-08-08 DIAGNOSIS — R4182 Altered mental status, unspecified: Secondary | ICD-10-CM | POA: Diagnosis not present

## 2019-08-08 DIAGNOSIS — I361 Nonrheumatic tricuspid (valve) insufficiency: Secondary | ICD-10-CM | POA: Diagnosis not present

## 2019-08-08 DIAGNOSIS — I639 Cerebral infarction, unspecified: Secondary | ICD-10-CM

## 2019-08-08 LAB — CBC
HCT: 50.1 % (ref 39.0–52.0)
Hemoglobin: 16.1 g/dL (ref 13.0–17.0)
MCH: 25.8 pg — ABNORMAL LOW (ref 26.0–34.0)
MCHC: 32.1 g/dL (ref 30.0–36.0)
MCV: 80.2 fL (ref 80.0–100.0)
Platelets: 179 10*3/uL (ref 150–400)
RBC: 6.25 MIL/uL — ABNORMAL HIGH (ref 4.22–5.81)
RDW: 13.6 % (ref 11.5–15.5)
WBC: 5.2 10*3/uL (ref 4.0–10.5)
nRBC: 0 % (ref 0.0–0.2)

## 2019-08-08 LAB — BASIC METABOLIC PANEL
Anion gap: 4 — ABNORMAL LOW (ref 5–15)
BUN: 19 mg/dL (ref 8–23)
CO2: 29 mmol/L (ref 22–32)
Calcium: 8.9 mg/dL (ref 8.9–10.3)
Chloride: 104 mmol/L (ref 98–111)
Creatinine, Ser: 1.37 mg/dL — ABNORMAL HIGH (ref 0.61–1.24)
GFR calc Af Amer: 54 mL/min — ABNORMAL LOW (ref >60)
GFR calc non Af Amer: 46 mL/min — ABNORMAL LOW (ref >60)
Glucose, Bld: 77 mg/dL (ref 70–99)
Potassium: 3.5 mmol/L (ref 3.5–5.1)
Sodium: 137 mmol/L (ref 135–145)

## 2019-08-08 LAB — CREATININE, SERUM
Creatinine, Ser: 1.12 mg/dL (ref 0.61–1.24)
GFR calc Af Amer: >60 mL/min (ref >60)
GFR calc non Af Amer: 59 mL/min — ABNORMAL LOW (ref >60)

## 2019-08-08 LAB — AMMONIA: Ammonia: <9 umol/L — ABNORMAL LOW (ref 9–35)

## 2019-08-08 LAB — SARS CORONAVIRUS 2 (TAT 6-24 HRS): SARS Coronavirus 2: NEGATIVE

## 2019-08-08 LAB — TSH: TSH: 1.703 u[IU]/mL (ref 0.350–4.500)

## 2019-08-08 MED ORDER — NORMAL SALINE NICU FLUSH
0.5000 mL | INTRAVENOUS | Status: DC | PRN
  Administered 2019-08-08: 0.9 mL via INTRAVENOUS
  Filled 2019-08-08: qty 10

## 2019-08-08 MED ORDER — TRIAMTERENE-HCTZ 37.5-25 MG PO TABS
2.0000 | ORAL_TABLET | Freq: Every day | ORAL | Status: DC
  Administered 2019-08-08: 2 via ORAL
  Filled 2019-08-08: qty 2

## 2019-08-08 MED ORDER — ASPIRIN 81 MG PO TBEC: 81.0000 mg | DELAYED_RELEASE_TABLET | Freq: Every day | ORAL | 0 refills | Status: DC

## 2019-08-08 NOTE — Progress Notes (Signed)
*  PRELIMINARY RESULTS* Echocardiogram 2D Echocardiogram has been performed.  Ian Parker 08/08/2019, 10:24 AM

## 2019-08-08 NOTE — Discharge Summary (Signed)
[facility] [place] NAME: Ian Parker    MR#:  [id number]  DATE OF BIRTH:  [birth year]  DATE OF ADMISSION:  08/07/2019   ADMITTING PHYSICIAN: Henreitta Leber, MD  DATE OF DISCHARGE: 08/08/19  PRIMARY CARE PHYSICIAN: Baxter Hire, MD   ADMISSION DIAGNOSIS:  Cerebrovascular accident (CVA), unspecified mechanism ([place]) [I63.9] DISCHARGE DIAGNOSIS:  Active Problems:   Altered mental status  SECONDARY DIAGNOSIS:   Past Medical History:  Diagnosis Date   Abnormal EKG    Abnormal PSA    BPH (benign prostatic hyperplasia)    Dysuria    Erectile dysfunction    Gastric ulcer    Goiter    Hemorrhoid    Inflammatory disease of prostate    Irritation of perirectal skin    Prostate cancer ([place])    Rectal burning    Skin lesion    HOSPITAL COURSE:   Ian Parker is an 85 year old male who presented to the ED with some confusion at home.  In the ED, CT head showed some chronic infarcts.  He was admitted for further management.  Altered mental status/confusion-  likely secondary to underlying dementia.  Altered mental status completely resolved on the day of discharge. Patient was alert and oriented x3. -CT head showed a chronic infarct in the right basal ganglia -CTA head/neck showed mild atherosclerotic disease -MRI brain showed old small vessel infarcts of the left cerebellum and right caudate nucleus -Patient was started on aspirin 81 mg daily -Ammonia and TSH were normal -Evaluated by physical therapy, who did not recommend any PT follow-up  Left thyroid nodule- incidental finding on CTA neck.  Nodule is 20 X 24 mm in size. -Needs outpatient thyroid ultrasound for further evaluation -TSH was normal  Essential hypertension- initially hypertensive, but his blood pressures improved -Home BP meds were continued  Dementia -Continued home Namenda and Aricept  BPH- stable -Continued home Flomax  DISCHARGE CONDITIONS:   Left thyroid nodule Hypertension Dementia BPH CONSULTS OBTAINED:  None DRUG ALLERGIES:   Allergies  Allergen Reactions   Ace Inhibitors    Peanuts [Peanut Oil]    Penicillins    Sulfa Antibiotics    DISCHARGE MEDICATIONS:   Allergies as of 08/08/2019      Reactions   Ace Inhibitors    Peanuts [peanut Oil]    Penicillins    Sulfa Antibiotics       Medication List    TAKE these medications   aspirin 81 MG EC tablet Take 1 tablet (81 mg total) by mouth daily. Start taking on: August 09, 2019   cholecalciferol 1000 units tablet Commonly known as: VITAMIN D Take 1,000 Units by mouth daily. Two tablets once daily   galantamine 4 MG tablet Commonly known as: RAZADYNE Take 4 mg by mouth daily.   memantine 10 MG tablet Commonly known as: NAMENDA Take 10 mg by mouth 2 (two) times daily.   potassium chloride SA 20 MEQ tablet Commonly known as: KLOR-CON Take 20 mEq by mouth 2 (two) times daily. Reported on 12/24/2015   tamsulosin 0.4 MG Caps capsule Commonly known as: FLOMAX Take 1 capsule (0.4 mg total) by mouth daily.   triamterene-hydrochlorothiazide 75-50 MG tablet Commonly known as: MAXZIDE Take 1 tablet by mouth daily.   vitamin B-12 1000 MCG tablet Commonly known as: CYANOCOBALAMIN Take 1,000 mcg by mouth daily.        DISCHARGE INSTRUCTIONS:  1.  Follow-up with PCP in 5 days 2.  Start aspirin 81 mg daily 3.  Needs thyroid ultrasound as an outpatient DIET:  Cardiac diet DISCHARGE CONDITION:  Stable ACTIVITY:  Activity as tolerated OXYGEN:  Home Oxygen: No.  Oxygen Delivery: room air DISCHARGE LOCATION:  home   If you experience worsening of your admission symptoms, develop shortness of breath, life threatening emergency, suicidal or homicidal thoughts you must seek medical attention immediately by calling 911 or calling your MD immediately  if symptoms less severe.  You Must read complete instructions/literature along with all the  possible adverse reactions/side effects for all the Medicines you take and that have been prescribed to you. Take any new Medicines after you have completely understood and accpet all the possible adverse reactions/side effects.   Please note  You were cared for by a hospitalist during your hospital stay. If you have any questions about your discharge medications or the care you received while you were in the hospital after you are discharged, you can call the unit and asked to speak with the hospitalist on call if the hospitalist that took care of you is not available. Once you are discharged, your primary care physician will handle any further medical issues. Please note that NO REFILLS for any discharge medications will be authorized once you are discharged, as it is imperative that you return to your primary care physician (or establish a relationship with a primary care physician if you do not have one) for your aftercare needs so that they can reassess your need for medications and monitor your lab values.    On the day of Discharge:  VITAL SIGNS:  Blood pressure (!) 149/80, pulse 69, temperature 98.2 F (36.8 C), temperature source Oral, resp. rate 17, height 6\' 1"  (1.854 m), weight 65.7 kg, SpO2 99 %. PHYSICAL EXAMINATION:  GENERAL:  85 y.o.-year-old patient sitting up in the chair in no acute distress, well-appearing. EYES: Pupils equal, round, reactive to light and accommodation. No scleral icterus. Extraocular muscles intact.  HEENT: Head atraumatic, normocephalic. Oropharynx and nasopharynx clear.  NECK:  Supple, no jugular venous distention. No thyroid enlargement, no tenderness.  LUNGS: Normal breath sounds bilaterally, no wheezing, rales,rhonchi or crepitation. No use of accessory muscles of respiration.  CARDIOVASCULAR: RRR, S1, S2 normal. No murmurs, rubs, or gallops.  ABDOMEN: Soft, non-tender, non-distended. Bowel sounds present. No organomegaly or mass.  EXTREMITIES: No pedal  edema, cyanosis, or clubbing.  NEUROLOGIC: Cranial nerves II through XII are intact. Muscle strength 5/5 in all extremities. Sensation intact. Gait not checked.  PSYCHIATRIC: The patient is alert and oriented x 3.  SKIN: No obvious rash, lesion, or ulcer.  DATA REVIEW:   CBC Recent Labs  Lab 08/08/19 0435  WBC 5.2  HGB 16.1  HCT 50.1  PLT 179    Chemistries  Recent Labs  Lab 08/08/19 0435  NA 137  K 3.5  CL 104  CO2 29  GLUCOSE 77  BUN 19  CREATININE 1.37*  CALCIUM 8.9     Microbiology Results  Results for orders placed or performed during the hospital encounter of 08/07/19  SARS CORONAVIRUS 2 (TAT 6-24 HRS) Nasopharyngeal Nasopharyngeal Swab     Status: None   Collection Time: 08/07/19  5:30 PM   Specimen: Nasopharyngeal Swab  Result Value Ref Range Status   SARS Coronavirus 2 NEGATIVE NEGATIVE Final    Comment: (NOTE) SARS-CoV-2 target nucleic acids are NOT DETECTED. The SARS-CoV-2 RNA is generally detectable in upper and lower respiratory specimens during the acute phase of infection. Negative  results do not preclude SARS-CoV-2 infection, do not rule out co-infections with other pathogens, and should not be used as the sole basis for treatment or other patient management decisions. Negative results must be combined with clinical observations, patient history, and epidemiological information. The expected result is Negative. Fact Sheet for Patients: SugarRoll.be Fact Sheet for Healthcare Providers: https://www.woods-mathews.com/ This test is not yet approved or cleared by the [place] FDA and  has been authorized for detection and/or diagnosis of SARS-CoV-2 by FDA under an Emergency Use Authorization (EUA). This EUA will remain  in effect (meaning this test can be used) for the duration of the COVID-19 declaration under Section 56 4(b)(1) of the Act, 21 U.S.C. section 360bbb-3(b)(1), unless the authorization is  terminated or revoked sooner. Performed at [facility], [place] [address] [place], [place] [address]     RADIOLOGY:  Ct Angio Head W Or Wo Contrast  Result Date: 08/07/2019 CLINICAL DATA:  Confusion, word finding difficulty. Rule out subarachnoid hemorrhage, vasospasm EXAM: CT ANGIOGRAPHY HEAD AND NECK TECHNIQUE: Multidetector CT imaging of the head and neck was performed using the standard protocol during bolus administration of intravenous contrast. Multiplanar CT image reconstructions and MIPs were obtained to evaluate the vascular anatomy. Carotid stenosis measurements (when applicable) are obtained utilizing NASCET criteria, using the distal internal carotid diameter as the denominator. CONTRAST:  174mL OMNIPAQUE IOHEXOL 350 MG/ML SOLN COMPARISON:  CT head 08/07/2019 FINDINGS: CT HEAD FINDINGS Brain: Patchy hypodensity throughout the cerebral white matter bilaterally is unchanged. Focal chronic infarct in the right corona radiata unchanged. Ventricle size normal. Chronic infarct left cerebellum unchanged Negative for acute infarct, hemorrhage, mass. Vascular: Negative for hyperdense vessel Skull: Negative Sinuses: Negative Orbits: None Review of the MIP images confirms the above findings CTA NECK FINDINGS Aortic arch: Standard branching. Imaged portion shows no evidence of aneurysm or dissection. No significant stenosis of the major arch vessel origins. Right carotid system: Atherosclerotic plaque in the right carotid bulb narrowing the lumen by less than 25% diameter stenosis. Tortuosity of the distal right internal carotid artery. Left carotid system: Atherosclerotic plaque in the left carotid bulb without significant diameter stenosis. Tortuosity distal left internal carotid artery. Vertebral arteries: Both vertebral arteries patent to the basilar without significant stenosis. Skeleton: Cervical spondylosis.  No acute skeletal abnormality. Other neck: Hypoenhancing nodule left lower thyroid  measuring 24 x 20 mm. Upper chest: Mild apical scarring.  No acute infiltrate or mass. Review of the MIP images confirms the above findings CTA HEAD FINDINGS Anterior circulation: Cavernous carotid widely patent bilaterally with minimal atherosclerotic disease. Anterior and middle cerebral arteries widely patent without stenosis or aneurysm. Posterior circulation: Both vertebral arteries patent to the basilar. PICA patent bilaterally. Basilar widely patent. Superior cerebellar and posterior cerebral arteries widely patent. Negative for aneurysm. Venous sinuses: Normal venous enhancement. Anatomic variants: None Review of the MIP images confirms the above findings IMPRESSION: 1. Negative for cerebral aneurysm. Negative for intracranial large vessel occlusion or significant stenosis 2. Mild atherosclerotic disease in the carotid bulb bilaterally without flow limiting stenosis. Both vertebral arteries widely patent. 3. Left thyroid nodule 24 x 20 mm. Recommend elective outpatient thyroid ultrasound for further evaluation. Electronically Signed   By: Franchot Gallo M.D.   On: 08/07/2019 19:00   Ct Head Wo Contrast  Result Date: 08/07/2019 CLINICAL DATA:  Dizziness and weakness. EXAM: CT HEAD WITHOUT CONTRAST TECHNIQUE: Contiguous axial images were obtained from the base of the skull through the vertex without intravenous contrast. COMPARISON:  CT head dated February 23, 2018. FINDINGS: Brain: Likely chronic infarct in the right basal ganglia measuring CSF density, new since 2019. No evidence of hemorrhage, hydrocephalus, extra-axial collection or mass lesion/mass effect. Vascular: Atherosclerotic vascular calcification of the carotid siphons. No hyperdense vessel. Skull: Normal. Negative for fracture or focal lesion. Sinuses/Orbits: No acute finding. Other: None. IMPRESSION: 1.  No acute intracranial abnormality. 2. 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 Electronically Signed   By:  Titus Dubin M.D.   On: 08/07/2019 17:02   Ct Angio Neck W And/or Wo Contrast  Result Date: 08/07/2019 CLINICAL DATA:  Confusion, word finding difficulty. Rule out subarachnoid hemorrhage, vasospasm EXAM: CT ANGIOGRAPHY HEAD AND NECK TECHNIQUE: Multidetector CT imaging of the head and neck was performed using the standard protocol during bolus administration of intravenous contrast. Multiplanar CT image reconstructions and MIPs were obtained to evaluate the vascular anatomy. Carotid stenosis measurements (when applicable) are obtained utilizing NASCET criteria, using the distal internal carotid diameter as the denominator. CONTRAST:  183mL OMNIPAQUE IOHEXOL 350 MG/ML SOLN COMPARISON:  CT head 08/07/2019 FINDINGS: CT HEAD FINDINGS Brain: Patchy hypodensity throughout the cerebral white matter bilaterally is unchanged. Focal chronic infarct in the right corona radiata unchanged. Ventricle size normal. Chronic infarct left cerebellum unchanged Negative for acute infarct, hemorrhage, mass. Vascular: Negative for hyperdense vessel Skull: Negative Sinuses: Negative Orbits: None Review of the MIP images confirms the above findings CTA NECK FINDINGS Aortic arch: Standard branching. Imaged portion shows no evidence of aneurysm or dissection. No significant stenosis of the major arch vessel origins. Right carotid system: Atherosclerotic plaque in the right carotid bulb narrowing the lumen by less than 25% diameter stenosis. Tortuosity of the distal right internal carotid artery. Left carotid system: Atherosclerotic plaque in the left carotid bulb without significant diameter stenosis. Tortuosity distal left internal carotid artery. Vertebral arteries: Both vertebral arteries patent to the basilar without significant stenosis. Skeleton: Cervical spondylosis.  No acute skeletal abnormality. Other neck: Hypoenhancing nodule left lower thyroid measuring 24 x 20 mm. Upper chest: Mild apical scarring.  No acute infiltrate or  mass. Review of the MIP images confirms the above findings CTA HEAD FINDINGS Anterior circulation: Cavernous carotid widely patent bilaterally with minimal atherosclerotic disease. Anterior and middle cerebral arteries widely patent without stenosis or aneurysm. Posterior circulation: Both vertebral arteries patent to the basilar. PICA patent bilaterally. Basilar widely patent. Superior cerebellar and posterior cerebral arteries widely patent. Negative for aneurysm. Venous sinuses: Normal venous enhancement. Anatomic variants: None Review of the MIP images confirms the above findings IMPRESSION: 1. Negative for cerebral aneurysm. Negative for intracranial large vessel occlusion or significant stenosis 2. Mild atherosclerotic disease in the carotid bulb bilaterally without flow limiting stenosis. Both vertebral arteries widely patent. 3. Left thyroid nodule 24 x 20 mm. Recommend elective outpatient thyroid ultrasound for further evaluation. Electronically Signed   By: Franchot Gallo M.D.   On: 08/07/2019 19:00   Mr Brain Wo Contrast  Result Date: 08/07/2019 CLINICAL DATA:  Encephalopathy EXAM: MRI HEAD WITHOUT CONTRAST TECHNIQUE: Multiplanar, multiecho pulse sequences of the brain and surrounding structures were obtained without intravenous contrast. COMPARISON:  Head CT 08/07/2019 FINDINGS: BRAIN: There is no acute infarct, acute hemorrhage or extra-axial collection. Multifocal white matter hyperintensity, most commonly due to chronic ischemic microangiopathy. There are old small vessel infarcts of the left cerebellum and right caudate. The cerebral and cerebellar volume are age-appropriate. There is no hydrocephalus. The midline structures are normal. VASCULAR: The major intracranial  arterial and venous sinus flow voids are normal. Susceptibility-sensitive sequences show no chronic microhemorrhage or superficial siderosis. SKULL AND UPPER CERVICAL SPINE: There is spinal canal stenosis at C3-4,  moderate-to-severe. SINUSES/ORBITS: There are no fluid levels or advanced mucosal thickening. The mastoid air cells and middle ear cavities are free of fluid. The orbits are normal. IMPRESSION: 1. No acute intracranial abnormality. 2. Old small vessel infarcts of the left cerebellum and right caudate nucleus. 3. Moderate-to-severe spinal canal stenosis at C3-4. Electronically Signed   By: Ulyses Jarred M.D.   On: 08/07/2019 22:29   Dg Chest Portable 1 View  Result Date: 08/07/2019 CLINICAL DATA:  Dizziness and weakness. EXAM: PORTABLE CHEST 1 VIEW COMPARISON:  Chest x-ray dated May 05, 2013. FINDINGS: Normal heart size. Slightly more pronounced convexity of the ascending aorta. Normal pulmonary vascularity. No focal consolidation, pleural effusion, or pneumothorax. No acute osseous abnormality. IMPRESSION: 1.  No active cardiopulmonary disease. 2. 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 Recommend CTA chest for further evaluation. Electronically Signed   By: Titus Dubin M.D.   On: 08/07/2019 17:57   Ct Angio Chest Aorta W And/or Wo Contrast  Result Date: 08/07/2019 CLINICAL DATA:  85 year old male with acute chest pain. EXAM: CT ANGIOGRAPHY CHEST WITH CONTRAST TECHNIQUE: Multidetector CT imaging of the chest was performed using the standard protocol during bolus administration of intravenous contrast. Multiplanar CT image reconstructions and MIPs were obtained to evaluate the vascular anatomy. CONTRAST:  156mL OMNIPAQUE IOHEXOL 350 MG/ML SOLN COMPARISON:  08/07/2019 radiograph 07/24/2013 thyroid ultrasound and other studies. FINDINGS: Cardiovascular: This is a technically borderline study. No pulmonary emboli are identified. Mild cardiomegaly noted. Coronary artery and aortic atherosclerotic calcifications are present. There is no evidence of thoracic aortic aneurysm or definite dissection. No pericardial effusion. Mediastinum/Nodes: LEFT thyroid nodules are again noted. No esophageal  or tracheal abnormalities noted. No mediastinal mass or enlarged lymph nodes. Lungs/Pleura: Minimal bibasilar atelectasis/scarring noted. There is no evidence of airspace disease, consolidation, nodule, mass, pleural effusion or pneumothorax. Upper Abdomen: Probable mild hepatic steatosis noted. No acute abnormality. Musculoskeletal: No acute abnormality. Thoracic syndesmophytes are noted. Review of the MIP images confirms the above findings. IMPRESSION: 1. No evidence of pulmonary emboli or thoracic aortic aneurysm/dissection. 2. Minimal bibasilar atelectasis/scarring. 3. Probable hepatic steatosis. 4. Mild cardiomegaly and coronary artery disease. 5.  Aortic Atherosclerosis (ICD10-I70.0). Electronically Signed   By: Margarette Canada M.D.   On: 08/07/2019 19:12     Management plans discussed with the patient, family and they are in agreement.  CODE STATUS: Full Code   TOTAL TIME TAKING CARE OF THIS PATIENT: 45 minutes.    Berna Spare Camdynn Maranto M.D on 08/08/2019 at 10:41 AM  Between 7am to 6pm - Pager - [phone]  After 6pm go to [email or web address] - Proofreader  [facility]  Office  [phone]  CC: Primary care physician; Baxter Hire, MD   Note: This dictation was prepared with Dragon dictation along with smaller phrase technology. Any transcriptional errors that result from this process are unintentional.

## 2019-08-08 NOTE — Evaluation (Signed)
Physical Therapy Evaluation Patient Details Name: Ian Parker MRN: [id number] DOB: 25-May-1931 Today's Date: 08/08/2019   History of Present Illness  85 y.o. male with a known history of dementia, BPH, hypertension, history of prostate cancer who presents to the hospital due to altered mental status/just not feeling well. CT, angio of head, chest and neck, and MRI negative for acute findings, Old small vessel infarcts of the left cerebellum and right caudate nucleus noted.    Clinical Impression  Patient up in chair and alert, oriented to self, place, time, situation (though unable to recall all details leading to hospital admission and thought it was October 2nd.) Behavior WFLs as well, no family at bedside to provide insight about mental status/PLOF. 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  The patient demonstrated transfers from recliner and standard commode mod I, ambulated ~360ft independently. Gait overall WFLs, some mild gait path deviations noted but safe/steady. The patient was able to return to bed mod I as well. The patient demonstrated and reported return to baseline level of functioning, no further acute PT needs indicated. PT to sign off. Please reconsult PT if pt status changes or acute needs are identified.      Follow Up Recommendations No PT follow up    Equipment Recommendations  None recommended by PT    Recommendations for Other Services       Precautions / Restrictions Precautions Precautions: None Restrictions Weight Bearing Restrictions: No      Mobility  Bed Mobility Overal bed mobility: Modified Independent                Transfers Overall transfer level: Modified independent               General transfer comment: use of chair arms, BSC grab bar  Ambulation/Gait Ambulation/Gait assistance: Independent Gait Distance (Feet): 360 Feet Assistive  device: None       General Gait Details: Gait mechanics grossly WFLs. No overt LOB noted, some mild gait path deviations noted but overall safe  Stairs            Wheelchair Mobility    Modified Rankin (Stroke Patients Only)       Balance Overall balance assessment: Modified Independent                                           Pertinent Vitals/Pain Pain Assessment: No/denies pain    Home Living Family/patient expects to be discharged to:: Private residence Living Arrangements: Spouse/significant other Available Help at Discharge: Family Type of Home: House Home Access: Stairs to enter Entrance Stairs-Rails: (Pt unable to decide which side the railing is on) Entrance Stairs-Number of Steps: 2 Home Layout: One level Home Equipment: None      Prior Function Level of Independence: Independent         Comments: Pt reported he does not need assistance dressing, bathing, walking, unclear if he cooks/drives, 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), 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, newspaper        Extremity/Trunk Assessment   Upper Extremity Assessment Upper Extremity Assessment: Overall WFL for tasks assessed    Lower Extremity Assessment Lower Extremity Assessment: Overall WFL for tasks assessed    Cervical / Trunk Assessment Cervical / Trunk Assessment: Normal  Communication   Communication: No difficulties  Cognition Arousal/Alertness: Awake/alert Behavior During Therapy: WFL for tasks assessed/performed Overall Cognitive Status: No family/caregiver present to determine baseline cognitive functioning                                 General Comments: Pt alert, Oriented to time, place, oriented to situation      General Comments      Exercises Other Exercises Other Exercises: Pt able to sit and utilize standard commode mod I (use of grab bars), and wash hands at the sink   Assessment/Plan    PT Assessment  Patent does not need any further PT services  PT Problem List         PT Treatment Interventions      PT Goals (Current goals can be found in the Care Plan section)       Frequency     Barriers to discharge        Co-evaluation               AM-PAC PT "6 Clicks" Mobility  Outcome Measure Help needed turning from your back to your side while in a flat bed without using bedrails?: None Help needed moving from lying on your back to sitting on the side of a flat bed without using bedrails?: None Help needed moving to and from a bed to a chair (including a wheelchair)?: None Help needed standing up from a chair using your arms (e.g., wheelchair or bedside chair)?: None Help needed to walk in hospital room?: None Help needed climbing 3-5 steps with a railing? : None 6 Click Score: 24    End of Session Equipment Utilized During Treatment: Gait belt Activity Tolerance: Patient tolerated treatment well Patient left: in bed;with call bell/phone within reach;with bed alarm set(In bed for echo, tech agreeable to return patient to chair after procedure) Nurse Communication: Mobility status PT Visit Diagnosis: Other abnormalities of gait and mobility (R26.89)    Time: [id number] PT Time Calculation (min) (ACUTE ONLY): 17 min   Charges:   PT Evaluation $PT Eval Low Complexity: 1 Low         Lieutenant Diego PT, DPT 9:59 AM,08/08/19 [phone]

## 2019-08-08 NOTE — Discharge Instructions (Signed)
It was so nice to meet you during this hospitalization!  You came into the hospital with some confusion. This was probably caused by a worsening of his dementia. We did an MRI of his brain, which showed some old strokes. He should take aspirin 81mg  daily to help prevent future strokes.  Take care, Dr. Brett Albino

## 2019-08-08 NOTE — Progress Notes (Signed)
Patient discharged home per MD order. All discharge instructions given and all questions answered. 

## 2019-12-29 ENCOUNTER — Telehealth: Payer: Self-pay

## 2019-12-29 ENCOUNTER — Other Ambulatory Visit: Payer: Medicare Other

## 2019-12-29 NOTE — Telephone Encounter (Signed)
Pt's son called and states that they came into the medical mall today and was told that the pt did not have a lab visit therefore his PSA was not drawn today. The son request that we draw PSA at pt's visit on Thursday so that he does not have to bring pt back out. Cancelled today's lab appointment and told pt's son that would be fine.

## 2020-01-04 ENCOUNTER — Ambulatory Visit: Payer: Medicare Other | Admitting: Urology

## 2020-01-05 ENCOUNTER — Other Ambulatory Visit: Payer: Self-pay

## 2020-01-05 ENCOUNTER — Other Ambulatory Visit: Payer: Medicare Other

## 2020-01-05 DIAGNOSIS — Z8546 Personal history of malignant neoplasm of prostate: Secondary | ICD-10-CM

## 2020-01-06 LAB — PSA: Prostate Specific Ag, Serum: 0.3 ng/mL (ref 0.0–4.0)

## 2020-01-10 ENCOUNTER — Encounter: Payer: Self-pay | Admitting: Urology

## 2020-01-10 ENCOUNTER — Other Ambulatory Visit: Payer: Self-pay | Admitting: Family Medicine

## 2020-01-10 ENCOUNTER — Ambulatory Visit (INDEPENDENT_AMBULATORY_CARE_PROVIDER_SITE_OTHER): Payer: Medicare Other | Admitting: Urology

## 2020-01-10 ENCOUNTER — Other Ambulatory Visit: Payer: Self-pay

## 2020-01-10 VITALS — BP 146/74 | HR 86 | Ht 73.0 in | Wt 155.0 lb

## 2020-01-10 DIAGNOSIS — C61 Malignant neoplasm of prostate: Secondary | ICD-10-CM

## 2020-01-10 DIAGNOSIS — N138 Other obstructive and reflux uropathy: Secondary | ICD-10-CM | POA: Diagnosis not present

## 2020-01-10 DIAGNOSIS — N401 Enlarged prostate with lower urinary tract symptoms: Secondary | ICD-10-CM | POA: Diagnosis not present

## 2020-01-10 MED ORDER — TAMSULOSIN HCL 0.4 MG PO CAPS: 0.4000 mg | ORAL_CAPSULE | Freq: Every day | ORAL | 3 refills | Status: DC

## 2020-01-10 NOTE — Progress Notes (Signed)
12/28/2018 11:41 AM   Ian Parker [birth year] [id number]  Referring provider: Baxter Hire, MD [place],  [place] [address]  Chief Complaint  Patient presents with  . Benign Prostatic Hypertrophy    HPI: Ian Parker is a 86 y.o. male who presents with a history of prostate cancer, erectile dysfunction and BPH with LUTS who presents today for a six months follow up with his son.  History of prostate cancer Patient underwent cryoablation of his prostate cancer in 2007.  His PSA at the time of diagnosis was 5.3 ng/mL.  I do not have his biopsy pathology available to me.  His most recent PSA was 0.3 ng/mL on 01/05/2020.    BPH WITH LUTS  (prostate and/or bladder) IPSS score: 6/2   Previous score: 6/2  Previous PVR: 14 mL  Major complaint(s): No urinary complaints at this time.  Denies any dysuria, hematuria or suprapubic pain.   Currently taking: Tamsulosin 0.4 mg.  Denies any recent fevers, chills, nausea or vomiting.  He has a family history of PCa, with his brother having had it, as well as a personal history of PCa.  IPSS    Row Name 01/10/20 1100         International Prostate Symptom Score   How often have you had the sensation of not emptying your bladder?  Less than 1 in 5     How often have you had to urinate less than every two hours?  Less than half the time     How often have you found you stopped and started again several times when you urinated?  Not at All     How often have you found it difficult to postpone urination?  Not at All     How often have you had a weak urinary stream?  Less than half the time     How often have you had to strain to start urination?  Not at All     How many times did you typically get up at night to urinate?  1 Time     Total IPSS Score  6       Quality of Life due to urinary symptoms   If you were to spend the rest of your life with your urinary condition just the way it is now how would you feel about  that?  Mostly Satisfied        Score:  1-7 Mild 8-19 Moderate 20-35 Severe  PMH: Past Medical History:  Diagnosis Date  . Abnormal EKG   . Abnormal PSA   . BPH (benign prostatic hyperplasia)   . Dysuria   . Erectile dysfunction   . Gastric ulcer   . Goiter   . Hemorrhoid   . Inflammatory disease of prostate   . Irritation of perirectal skin   . Prostate cancer ([place])   . Rectal burning   . Skin lesion     Surgical History: Past Surgical History:  Procedure Laterality Date  . cryoablation of prostate  02/23/2006  . ESOPHAGOGASTRODUODENOSCOPY N/A 03/14/2015   Procedure: ESOPHAGOGASTRODUODENOSCOPY (EGD);  Surgeon: Hulen Luster, MD;  [facility] ENDOSCOPY;  Service: Gastroenterology;  Laterality: N/A;  . THYROIDECTOMY     Subtotal    Home Medications:  Allergies as of 01/10/2020      Reactions   Ace Inhibitors    Peanuts [peanut Oil]    Penicillins    Sulfa Antibiotics  Medication List       Accurate as of January 10, 2020 11:40 AM. If you have any questions, ask your nurse or doctor.        amLODipine 5 MG tablet Commonly known as: NORVASC Take by mouth.   aspirin 81 MG EC tablet Take 1 tablet (81 mg total) by mouth daily.   cholecalciferol 1000 units tablet Commonly known as: VITAMIN D Take 1,000 Units by mouth daily. Two tablets once daily   galantamine 4 MG tablet Commonly known as: RAZADYNE Take 4 mg by mouth daily.   memantine 10 MG tablet Commonly known as: NAMENDA Take 10 mg by mouth 2 (two) times daily.   potassium chloride SA 20 MEQ tablet Commonly known as: KLOR-CON Take 20 mEq by mouth 2 (two) times daily. Reported on 12/24/2015   tamsulosin 0.4 MG Caps capsule Commonly known as: FLOMAX Take 1 capsule (0.4 mg total) by mouth daily.   triamterene-hydrochlorothiazide 75-50 MG tablet Commonly known as: MAXZIDE Take 1 tablet by mouth daily.   vitamin B-12 1000 MCG tablet Commonly known as: CYANOCOBALAMIN Take 1,000 mcg by mouth  daily.       Allergies:  Allergies  Allergen Reactions  . Ace Inhibitors   . Peanuts [Peanut Oil]   . Penicillins   . Sulfa Antibiotics     Family History: Family History  Problem Relation Age of Onset  . Heart attack Mother   . Prostate cancer Brother   . Brain cancer Father        tumor unknown if cancer  . Heart attack Sister   . Kidney disease Neg Hx   . Kidney cancer Neg Hx   . Bladder Cancer Neg Hx     Social History:  reports that he has never smoked. He has never used smokeless tobacco. He reports that he does not drink alcohol or use drugs.  ROS: For pertinent review of systems please refer to history of present illness  Physical Exam: BP (!) 146/74   Pulse 86   Ht 6\' 1"  (1.854 m)   Wt 155 lb (70.3 kg)   BMI 20.45 kg/m   Constitutional:  Well nourished. Alert and oriented, No acute distress. HEENT: [place] AT, mask in place.  Trachea midline, no masses. Cardiovascular: No clubbing, cyanosis, or edema. Respiratory: Normal respiratory effort, no increased work of breathing. GI: Abdomen is soft, non tender, non distended, no abdominal masses.  GU: No CVA tenderness.  No bladder fullness or masses.  Patient with uncircumcised phallus.  Foreskin easily retracted.  Urethral meatus is patent.  No penile discharge. No penile lesions or rashes. Scrotum without lesions, cysts, rashes and/or edema.  Testicles are located scrotally bilaterally. No masses are appreciated in the testicles. Left and right epididymis are normal. Rectal: Patient with  normal sphincter tone. Anus and perineum without scarring or rashes. No rectal masses are appreciated. Prostate is approximately 45 grams and nodular.  Seminal vesicles could not be palpated Skin: No rashes, bruises or suspicious lesions. Lymph: No inguinal adenopathy. Neurologic: Grossly intact, no focal deficits, moving all 4 extremities. Psychiatric: Normal mood and affect.  Laboratory Data: PSA history:             0.2 ng/mL  on 01/24/2013             0.3 ng/mL on 06/25/2014             0.2 ng/mL on 06/26/2015  0.3 ng/mL on 12/23/2015             0.4 ng/mL on 06/19/2016               0.3 ng/mL on 06/21/2017             0.3 ng/mL on 12/26/2018  0.3 ng/mL on 01/05/2020  I have reviewed the labs  Pertinent Imaging  Results for ZEBASTIAN, BEAHAN (MRN [id number]) as of 06/30/2019 09:56  Ref. Range 06/30/2019 09:54  Scan Result Unknown 18    Assessment & Plan:    1. Prostate cancer - Underwent cryoablation of his prostate cancer in 2007 -  iPSA was 5.3 ng/mL - PSA stable - RTC in 6 months for PSA   3. BPH with LUTS - IPSS score is 6/2, it is stable  - Continue conservative management, avoiding bladder irritants and timed voiding's - Continue tamsulosin 0.4 mg daily - RTC in 6 months for IPSS, PSA and exam    Return in about 6 months (around 07/12/2020) for IPSS, PSA and exam.  Zara Council, [facility]  [place] [address], [place], [place] [address] [phone]

## 2020-01-29 ENCOUNTER — Other Ambulatory Visit: Payer: Self-pay | Admitting: Urology

## 2020-02-06 ENCOUNTER — Other Ambulatory Visit: Payer: Self-pay | Admitting: Urology

## 2020-07-04 ENCOUNTER — Other Ambulatory Visit: Payer: Medicare Other

## 2020-07-04 ENCOUNTER — Other Ambulatory Visit: Payer: Self-pay

## 2020-07-04 DIAGNOSIS — C61 Malignant neoplasm of prostate: Secondary | ICD-10-CM

## 2020-07-05 LAB — PSA: Prostate Specific Ag, Serum: 0.4 ng/mL (ref 0.0–4.0)

## 2020-07-11 NOTE — Progress Notes (Addendum)
07/12/2020 11:05 AM   [name] 1931/02/18 [id number]  Referring provider: Baxter Hire, MD [place],  [place] [address] Chief Complaint  Patient presents with  . Prostate Cancer    HPI: Ian Parker is a 86 y.o. male who returns today for a 6 month follow up of prostate cancer and BPH with LUTS. Patient is accompanied by his wife today.   History of prostate cancer Patient underwent cryoablation of his prostate cancer in 2007.  His PSA at the time of diagnosis was 5.3 ng/mL.  I do not have his biopsy pathology available to me.  His most recent PSA was 0.4 ng/mL on 07/04/2020.    BPH WITH LUTS  (prostate and/or bladder) IPSS score: 20/5  Previous score: 6/2  Previous PVR: 14 mL  Major complaint(s): Spraying of urinary stream and straining to urinate which has been going on for a while. Reports a tender rectum.    Denies dysuria, hematuria or suprapubic pain.   Currently taking: Tamsulosin 0.4 mg.  Denies fevers, chills, nausea or vomiting.   He has a family history of PCa, with his brother having had it, as well as a personal history of PCa.   IPSS    Row Name 07/12/20 1000         International Prostate Symptom Score   How often have you had the sensation of not emptying your bladder? Not at All     How often have you had to urinate less than every two hours? Almost always     How often have you found you stopped and started again several times when you urinated? More than half the time     How often have you found it difficult to postpone urination? Almost always     How often have you had a weak urinary stream? About half the time     How often have you had to strain to start urination? Not at All     How many times did you typically get up at night to urinate? 3 Times     Total IPSS Score 20       Quality of Life due to urinary symptoms   If you were to spend the rest of your life with your urinary condition just the way it is now  how would you feel about that? Unhappy           Score:  1-7 Mild 8-19 Moderate 20-35 Severe   PMH: Past Medical History:  Diagnosis Date  . Abnormal EKG   . Abnormal PSA   . BPH (benign prostatic hyperplasia)   . Dysuria   . Erectile dysfunction   . Gastric ulcer   . Goiter   . Hemorrhoid   . Inflammatory disease of prostate   . Irritation of perirectal skin   . Prostate cancer ([place])   . Rectal burning   . Skin lesion     Surgical History: Past Surgical History:  Procedure Laterality Date  . cryoablation of prostate  02/23/2006  . ESOPHAGOGASTRODUODENOSCOPY N/A 03/14/2015   Procedure: ESOPHAGOGASTRODUODENOSCOPY (EGD);  Surgeon: Hulen Luster, MD;  Location: [facility] ENDOSCOPY;  Service: Gastroenterology;  Laterality: N/A;  . THYROIDECTOMY     Subtotal    Home Medications:  Allergies as of 07/12/2020      Reactions   Ace Inhibitors    Peanuts [peanut Oil]    Penicillins    Sulfa Antibiotics       Medication  List       Accurate as of July 12, 2020 11:05 AM. If you have any questions, ask your nurse or doctor.        amLODipine 10 MG tablet Commonly known as: NORVASC What changed: Another medication with the same name was removed. Continue taking this medication, and follow the directions you see here. Changed by: Zara Council, PA-C   aspirin 81 MG EC tablet Take 1 tablet (81 mg total) by mouth daily.   cholecalciferol 1000 units tablet Commonly known as: VITAMIN D Take 1,000 Units by mouth daily. Two tablets once daily   galantamine 8 MG 24 hr capsule Commonly known as: RAZADYNE ER Take by mouth. What changed: Another medication with the same name was removed. Continue taking this medication, and follow the directions you see here. Changed by: Zara Council, PA-C   memantine 10 MG tablet Commonly known as: NAMENDA Take 10 mg by mouth 2 (two) times daily.   potassium chloride SA 20 MEQ tablet Commonly known as: KLOR-CON Take 20 mEq by mouth  2 (two) times daily. Reported on 12/24/2015   tamsulosin 0.4 MG Caps capsule Commonly known as: FLOMAX Take 1 capsule (0.4 mg total) by mouth daily.   triamterene-hydrochlorothiazide 75-50 MG tablet Commonly known as: MAXZIDE Take 1 tablet by mouth daily.   vitamin B-12 1000 MCG tablet Commonly known as: CYANOCOBALAMIN Take 1,000 mcg by mouth daily.       Allergies:  Allergies  Allergen Reactions  . Ace Inhibitors   . Peanuts [Peanut Oil]   . Penicillins   . Sulfa Antibiotics     Family History: Family History  Problem Relation Age of Onset  . Heart attack Mother   . Prostate cancer Brother   . Brain cancer Father        tumor unknown if cancer  . Heart attack Sister   . Kidney disease Neg Hx   . Kidney cancer Neg Hx   . Bladder Cancer Neg Hx     Social History:  reports that he has never smoked. He has never used smokeless tobacco. He reports that he does not drink alcohol and does not use drugs.   Physical Exam: BP 128/85   Pulse 81   Wt 145 lb (65.8 kg)   BMI 19.13 kg/m   Constitutional:  Well nourished. Alert and oriented, No acute distress. HEENT: [place] AT, mask in place.  Trachea midline Cardiovascular: No clubbing, cyanosis, or edema. Respiratory: Normal respiratory effort, no increased work of breathing. GU: No CVA tenderness.  No bladder fullness or masses.  Patient with circumcised phallus. Urethral meatus is patent.  No penile discharge. No penile lesions or rashes. Scrotum without lesions, cysts, rashes and/or edema.  Testicles are located scrotally bilaterally. No masses are appreciated in the testicles. Left and right epididymis are normal. Rectal: Patient with  normal sphincter tone. Anus and perineum without scarring or rashes. No rectal masses are appreciated. Prostate is approximately 45 grams, firm, no nodules are appreciated. Seminal vesicles could not be palpated Skin: No rashes, bruises or suspicious lesions. Lymph: No inguinal  adenopathy. Neurologic: Grossly intact, no focal deficits, moving all 4 extremities. Psychiatric: Normal mood and affect.  Laboratory Data:  PSA trend: Component     Latest Ref Rng & Units 06/21/2018 12/26/2018 06/30/2019 01/05/2020  Prostate Specific Ag, Serum     0.0 - 4.0 ng/mL 0.3 0.3 0.3 0.3   Component     Latest Ref Rng & Units 07/04/2020  Prostate Specific  Ag, Serum     0.0 - 4.0 ng/mL 0.4     Assessment & Plan:    1. Prostate cancer  Underwent cryoablation of his prostate cancer in 2007 - iPSA was 5.3 ng/dL. PSA 0.4 ng/mL on 07/04/2020 RTC in 6 months for PSA  2. BPH with LUTS  IPSS score is 20/5, it is worsening Continue conservative management, avoiding bladder irritants and timed voiding's Continue tamsulosin 0.4 mg daily. Discussed a cystoscopy to out stricture disease I have explained to the patient that they will  be scheduled for a cystoscopy in our office to evaluate their bladder.  The cystoscopy consists of passing a tube with a lens up through their urethra and into their urinary bladder.   We will inject the urethra with a lidocaine gel prior to introducing the cystoscope to help with any discomfort during the procedure.   After the procedure, they might experience blood in the urine and discomfort with urination.  This will abate after the first few voids.  I have  encouraged the patient to increase water intake  during this time.  Patient denies any allergies to lidocaine.  Patient agreed. RTC for cystoscopy   Return for cystoscopy to rule out stricture disease.  [place] [address] [place], [place] [address] [phone]  I, Selena Batten, am acting as a scribe for [employer],  I have reviewed the above documentation for accuracy and completeness, and I agree with the above.    Zara Council, PA-C

## 2020-07-12 ENCOUNTER — Encounter: Payer: Self-pay | Admitting: Urology

## 2020-07-12 ENCOUNTER — Ambulatory Visit (INDEPENDENT_AMBULATORY_CARE_PROVIDER_SITE_OTHER): Payer: Medicare Other | Admitting: Urology

## 2020-07-12 ENCOUNTER — Other Ambulatory Visit: Payer: Self-pay

## 2020-07-12 VITALS — BP 128/85 | HR 81 | Wt 145.0 lb

## 2020-07-12 DIAGNOSIS — N138 Other obstructive and reflux uropathy: Secondary | ICD-10-CM | POA: Diagnosis not present

## 2020-07-12 DIAGNOSIS — C61 Malignant neoplasm of prostate: Secondary | ICD-10-CM | POA: Diagnosis not present

## 2020-07-12 DIAGNOSIS — N401 Enlarged prostate with lower urinary tract symptoms: Secondary | ICD-10-CM | POA: Diagnosis not present

## 2020-07-12 NOTE — Patient Instructions (Signed)
Cystoscopy Cystoscopy is a procedure that is used to help diagnose and sometimes treat conditions that affect the lower urinary tract. The lower urinary tract includes the bladder and the urethra. The urethra is the tube that drains urine from the bladder. Cystoscopy is done using a thin, tube-shaped instrument with a light and camera at the end (cystoscope). The cystoscope may be hard or flexible, depending on the goal of the procedure. The cystoscope is inserted through the urethra, into the bladder. Cystoscopy may be recommended if you have:  Urinary tract infections that keep coming back.  Blood in the urine (hematuria).  An inability to control when you urinate (urinary incontinence) or an overactive bladder.  Unusual cells found in a urine sample.  A blockage in the urethra, such as a urinary stone.  Painful urination.  An abnormality in the bladder found during an intravenous pyelogram (IVP) or CT scan. Cystoscopy may also be done to remove a sample of tissue to be examined under a microscope (biopsy). What are the risks? Generally, this is a safe procedure. However, problems may occur, including:  Infection.  Bleeding.  What happens during the procedure?  1. You will be given one or more of the following: ? A medicine to numb the area (local anesthetic). 2. The area around the opening of your urethra will be cleaned. 3. The cystoscope will be passed through your urethra into your bladder. 4. Germ-free (sterile) fluid will flow through the cystoscope to fill your bladder. The fluid will stretch your bladder so that your health care provider can clearly examine your bladder walls. 5. Your doctor will look at the urethra and bladder. 6. The cystoscope will be removed The procedure may vary among health care providers  What can I expect after the procedure? After the procedure, it is common to have: 1. Some soreness or pain in your abdomen and urethra. 2. Urinary symptoms.  These include: ? Mild pain or burning when you urinate. Pain should stop within a few minutes after you urinate. This may last for up to 1 week. ? A small amount of blood in your urine for several days. ? Feeling like you need to urinate but producing only a small amount of urine. Follow these instructions at home: General instructions  Return to your normal activities as told by your health care provider.   Do not drive for 24 hours if you were given a sedative during your procedure.  Watch for any blood in your urine. If the amount of blood in your urine increases, call your health care provider.  If a tissue sample was removed for testing (biopsy) during your procedure, it is up to you to get your test results. Ask your health care provider, or the department that is doing the test, when your results will be ready.  Drink enough fluid to keep your urine pale yellow.  Keep all follow-up visits as told by your health care provider. This is important. Contact a health care provider if you:  Have pain that gets worse or does not get better with medicine, especially pain when you urinate.  Have trouble urinating.  Have more blood in your urine. Get help right away if you:  Have blood clots in your urine.  Have abdominal pain.  Have a fever or chills.  Are unable to urinate. Summary  Cystoscopy is a procedure that is used to help diagnose and sometimes treat conditions that affect the lower urinary tract.  Cystoscopy is done using   a thin, tube-shaped instrument with a light and camera at the end.  After the procedure, it is common to have some soreness or pain in your abdomen and urethra.  Watch for any blood in your urine. If the amount of blood in your urine increases, call your health care provider.  If you were prescribed an antibiotic medicine, take it as told by your health care provider. Do not stop taking the antibiotic even if you start to feel better. This  information is not intended to replace advice given to you by your health care provider. Make sure you discuss any questions you have with your health care provider. Document Revised: 10/11/2018 Document Reviewed: 10/11/2018 Elsevier Patient Education  2020 [employer]vier Inc[employer]   

## 2020-07-23 NOTE — Progress Notes (Signed)
   07/24/2020  CC:  Chief Complaint  Patient presents with  . Cysto    HPI: Ian Parker is a 86 y.o. male who returns for a cystoscopy to rule out urethral strictures.   History of prostate cancer Patient underwent cryoablation of his prostate cancer in 2007. His PSA at the time of diagnosis was 5.3 ng/mL. I do not have his biopsy pathology available to me. His most recent PSA was 0.4 ng/mL on 07/04/2020.  BPH WITH LUTS (prostate and/or bladder) Major complaint(s): Spraying of urinary stream and straining to urinate which has been going on for a while. Reported a tender rectum.    Currently taking: Tamsulosin 0.4 mg  He has a family history of PCa, with his brother having had it, as well as a personal history of PCa.  He noted symptoms related to ED today.   There were no vitals taken for this visit. NED. A&Ox3.   No respiratory distress   Abd soft, NT, ND Normal phallus with bilateral descended testicles  Cystoscopy Procedure Note  Patient identification was confirmed, informed consent was obtained, and patient was prepped using Betadine solution.  Lidocaine jelly was administered per urethral meatus.     Pre-Procedure: - Inspection reveals a normal caliber ureteral meatus.  Procedure: The flexible cystoscope was introduced without difficulty - No urethral strictures/lesions are present. - Normal prostate  - Mildly elevated bladder neck - Bilateral ureteral orifices identified - Bladder mucosa  reveals no ulcers, tumors, or lesions - No bladder stones - No trabeculation  Retroflexion shows no abnormalities.    Post-Procedure: - Patient tolerated the procedure well  Assessment/ Plan:  1. Prostate cancer s/pcryoablation of his prostate cancer in 2007 - iPSA was 5.3 ng/dL. PSA 0.4 ng/mL on 07/04/2020  2. BPH with LUTS Cystoscopy is unremarkable- no evidence of stricture Continue tamsulosin 0.4 mg.   3. ED Will have him address issues with  Zara Council, PA-C on follow up.    Fransico Him, am acting as a scribe for Dr. Hollice Espy.  I have reviewed the above documentation for accuracy and completeness, and I agree with the above.   Hollice Espy, MD

## 2020-07-24 ENCOUNTER — Ambulatory Visit (INDEPENDENT_AMBULATORY_CARE_PROVIDER_SITE_OTHER): Payer: Medicare Other | Admitting: Urology

## 2020-07-24 ENCOUNTER — Other Ambulatory Visit: Payer: Self-pay

## 2020-07-24 DIAGNOSIS — Z8546 Personal history of malignant neoplasm of prostate: Secondary | ICD-10-CM

## 2020-07-25 LAB — URINALYSIS, COMPLETE
Bilirubin, UA: NEGATIVE
Glucose, UA: NEGATIVE
Ketones, UA: NEGATIVE
Leukocytes,UA: NEGATIVE
Nitrite, UA: NEGATIVE
Protein,UA: NEGATIVE
RBC, UA: NEGATIVE
Specific Gravity, UA: 1.02 (ref 1.005–1.030)
Urobilinogen, Ur: 0.2 mg/dL (ref 0.2–1.0)
pH, UA: 7 (ref 5.0–7.5)

## 2020-07-25 LAB — MICROSCOPIC EXAMINATION
Bacteria, UA: NONE SEEN
RBC, Urine: NONE SEEN /hpf (ref 0–2)

## 2020-08-13 NOTE — Progress Notes (Signed)
07/12/2020 2:08 PM   Ian Parker July 10, 1931 [id number]  Referring provider: Baxter Hire, MD [place],  [place] [address] Chief Complaint  Patient presents with  . Erectile Dysfunction    HPI: WITT PLITT is a 86 y.o. male who returns today for a follow up of prostate cancer and BPH with LUTS.  Patient is accompanied by his son, Jeovani Weisenburger.    History of prostate cancer Patient underwent cryoablation of his prostate cancer in 2007.  His PSA at the time of diagnosis was 5.3 ng/mL.  I do not have his biopsy pathology available to me.  His most recent PSA was 0.4 ng/mL on 07/04/2020.    BPH WITH LUTS  (prostate and/or bladder) IPSS score: 20/5  Previous score: 20/5    Previous PVR: 14 mL  Patient denies any modifying or aggravating factors.  Patient denies any gross hematuria, dysuria or suprapubic/flank pain.  Patient denies any fevers, chills, nausea or vomiting.   Currently taking: Tamsulosin 0.4 mg.  Recent cystoscopy with Dr. Erlene Quan on 07/24/2020 was NED.   He continues to have issues with ED.  He has tried PDE5i's and the intracavernousal injections in the past with equivocal results.  He states he is no longer having spontaneous erections.   He denies any pain or curvature with erections.     PMH: Past Medical History:  Diagnosis Date  . Abnormal EKG   . Abnormal PSA   . BPH (benign prostatic hyperplasia)   . Dysuria   . Erectile dysfunction   . Gastric ulcer   . Goiter   . Hemorrhoid   . Inflammatory disease of prostate   . Irritation of perirectal skin   . Prostate cancer ([place])   . Rectal burning   . Skin lesion     Surgical History: Past Surgical History:  Procedure Laterality Date  . cryoablation of prostate  02/23/2006  . ESOPHAGOGASTRODUODENOSCOPY N/A 03/14/2015   Procedure: ESOPHAGOGASTRODUODENOSCOPY (EGD);  Surgeon: Hulen Luster, MD;  Location: [facility] ENDOSCOPY;  Service: Gastroenterology;  Laterality: N/A;  .  THYROIDECTOMY     Subtotal    Home Medications:  Allergies as of 08/14/2020      Reactions   Ace Inhibitors    Peanuts [peanut Oil]    Penicillins    Sulfa Antibiotics       Medication List       Accurate as of August 14, 2020 11:59 PM. If you have any questions, ask your nurse or doctor.        amLODipine 10 MG tablet Commonly known as: NORVASC   aspirin 81 MG EC tablet Take 1 tablet (81 mg total) by mouth daily.   cholecalciferol 1000 units tablet Commonly known as: VITAMIN D Take 1,000 Units by mouth daily. Two tablets once daily   galantamine 8 MG 24 hr capsule Commonly known as: RAZADYNE ER Take by mouth.   memantine 10 MG tablet Commonly known as: NAMENDA Take 10 mg by mouth 2 (two) times daily.   potassium chloride SA 20 MEQ tablet Commonly known as: KLOR-CON Take 20 mEq by mouth 2 (two) times daily. Reported on 12/24/2015   tamsulosin 0.4 MG Caps capsule Commonly known as: FLOMAX Take 1 capsule (0.4 mg total) by mouth daily.   triamterene-hydrochlorothiazide 75-50 MG tablet Commonly known as: MAXZIDE Take 1 tablet by mouth daily.   vitamin B-12 1000 MCG tablet Commonly known as: CYANOCOBALAMIN Take 1,000 mcg by mouth daily.  Allergies:  Allergies  Allergen Reactions  . Ace Inhibitors   . Peanuts [Peanut Oil]   . Penicillins   . Sulfa Antibiotics     Family History: Family History  Problem Relation Age of Onset  . Heart attack Mother   . Prostate cancer Brother   . Brain cancer Father        tumor unknown if cancer  . Heart attack Sister   . Kidney disease Neg Hx   . Kidney cancer Neg Hx   . Bladder Cancer Neg Hx     Social History:  reports that he has never smoked. He has never used smokeless tobacco. He reports that he does not drink alcohol and does not use drugs.  ROS For pertinent review of systems please refer to history of present illness  Physical Exam: BP (!) 143/71   Pulse 69   Ht _0  (1.803 m)   Wt 155  lb 3.2 oz (70.4 kg)   BMI 21.65 kg/m   Constitutional:  Well nourished. Alert and oriented, No acute distress. HEENT: [place] AT, mask in place.  Trachea midline Cardiovascular: No clubbing, cyanosis, or edema. Respiratory: Normal respiratory effort, no increased work of breathing. Neurologic: Grossly intact, no focal deficits, moving all 4 extremities. Psychiatric: Normal mood and affect.  Laboratory Data: PSA trend: Component     Latest Ref Rng & Units 06/21/2018 12/26/2018 06/30/2019 01/05/2020  Prostate Specific Ag, Serum     0.0 - 4.0 ng/mL 0.3 0.3 0.3 0.3   Component     Latest Ref Rng & Units 07/04/2020  Prostate Specific Ag, Serum     0.0 - 4.0 ng/mL 0.4   Specimen:  Blood  Ref Range & Units 2 mo ago  Glucose 70 - 110 mg/dL 104      Sodium 136 - 145 mmol/L 144      Potassium 3.6 - 5.1 mmol/L 3.8      Chloride 97 - 109 mmol/L 105      Carbon Dioxide (CO2) 22.0 - 32.0 mmol/L 35.4High      Urea Nitrogen (BUN) 7 - 25 mg/dL 11      Creatinine 0.7 - 1.3 mg/dL 1.1      Glomerular Filtration Rate (eGFR), MDRD Estimate >60 mL/min/1.73sq m 77      Calcium 8.7 - 10.3 mg/dL 9.3      AST  8 - 39 U/L 18      ALT  6 - 57 U/L 19      Alk Phos (alkaline Phosphatase) 34 - 104 U/L 68      Albumin 3.5 - 4.8 g/dL 4.3      Bilirubin, Total 0.3 - 1.2 mg/dL 0.8      Protein, Total 6.1 - 7.9 g/dL 6.6      A/G Ratio 1.0 - 5.0 gm/dL 1.9      Resulting Agency  [facility] - [facility]  Specimen Collected: 05/21/20 8:13 AM Last Resulted: 05/21/20 9:35 AM  Received From: [facility]  Result Received: 06/27/20 11:30 AM   Specimen:  Urine  Ref Range & Units 2 mo ago  Color Yellow  Yellow      Clarity Clear  Clear      Specific Gravity 1.000 - 1.030  1.015      pH, Urine 5.0 - 8.0  7.0      Protein, Urinalysis Negative, Trace mg/dL 30 Abnormal      Glucose, Urinalysis Negative mg/dL Negative  Ketones, Urinalysis Negative mg/dL Negative      Blood,  Urinalysis Negative  SmallAbnormal      Nitrite, Urinalysis Negative  Negative      Leukocyte Esterase, Urinalysis Negative  Negative      White Blood Cells, Urinalysis None Seen, 0-3 /hpf None Seen      Red Blood Cells, Urinalysis None Seen, 0-3 /hpf 4-10Abnormal      Bacteria, Urinalysis None Seen /hpf None Seen      Squamous Epithelial Cells, Urinalysis Rare, Few, None Seen /hpf Rare      Resulting Agency  [facility] - [facility]  Specimen Collected: 05/21/20 8:13 AM Last Resulted: 05/21/20 9:06 AM  Received From: [facility]  Result Received: 06/27/20 11:30 AM     Assessment & Plan:    1. Prostate cancer  Underwent cryoablation of his prostate cancer in 2007 - iPSA was 5.3 ng/dL. PSA 0.4 ng/mL on 07/04/2020 RTC in 6 months for PSA  2. BPH with LUTS  RTC in 6 months for I PSS and PVR  3. Erectile dysfunction We discussed trying either the PDE5i's or injections again His son would like for him to follow up with his PCP prior to prescribing treatments for his ED at this time   Return for Keep follow up in March .  [facility] [address], [place], [place] [address] [phone]  I spent 25 minutes on the day of the encounter to include pre-visit record review, face-to-face time with the patient, and post-visit ordering of tests.

## 2020-08-14 ENCOUNTER — Other Ambulatory Visit: Payer: Self-pay

## 2020-08-14 ENCOUNTER — Encounter: Payer: Self-pay | Admitting: Urology

## 2020-08-14 ENCOUNTER — Ambulatory Visit (INDEPENDENT_AMBULATORY_CARE_PROVIDER_SITE_OTHER): Payer: Medicare Other | Admitting: Urology

## 2020-08-14 VITALS — BP 143/71 | HR 69 | Ht 71.0 in | Wt 155.2 lb

## 2020-08-14 DIAGNOSIS — N401 Enlarged prostate with lower urinary tract symptoms: Secondary | ICD-10-CM | POA: Diagnosis not present

## 2020-08-14 DIAGNOSIS — N138 Other obstructive and reflux uropathy: Secondary | ICD-10-CM

## 2020-08-14 DIAGNOSIS — C61 Malignant neoplasm of prostate: Secondary | ICD-10-CM

## 2021-01-01 ENCOUNTER — Other Ambulatory Visit: Payer: Self-pay

## 2021-01-01 DIAGNOSIS — C61 Malignant neoplasm of prostate: Secondary | ICD-10-CM

## 2021-01-06 ENCOUNTER — Other Ambulatory Visit: Payer: Medicare Other

## 2021-01-07 ENCOUNTER — Other Ambulatory Visit: Payer: Medicare Other

## 2021-01-07 ENCOUNTER — Other Ambulatory Visit: Payer: Self-pay

## 2021-01-07 DIAGNOSIS — C61 Malignant neoplasm of prostate: Secondary | ICD-10-CM

## 2021-01-08 LAB — PSA: Prostate Specific Ag, Serum: 0.4 ng/mL (ref 0.0–4.0)

## 2021-01-14 NOTE — Progress Notes (Signed)
07/12/2020 3:12 PM   [name] 02/14/1931 [id number]  Referring provider: Baxter Hire, MD [place],  [place] [address] Chief Complaint  Patient presents with  . Prostate Cancer   Urological history: 1. Prostate cancer - PSA 0.4 in 12/2020 - underwent cryoablation of his prostate cancer in 2007- iPSA 5.3 ng/mL  2. LU TS - PVR 166 mL - cysto NED 07/2020 - managed with tamsulosin 0.4 mg daily   3. ED - deferred treatments to PCP    HPI: Ian Parker is a 87 y.o. male who returns today for a follow up. Patient is accompanied by his son, Ian Parker.    Patient with no urinary complaints today.  Patient denies any modifying or aggravating factors.  Patient denies any gross hematuria, dysuria or suprapubic/flank pain.  Patient denies any fevers, chills, nausea or vomiting.   He states that his penis is cold and he is wanting me to prescribe something that helps him.  He states that it is not painful is just cold.  I explained to him that due to his age and his prostate cancer treatment that he probably does not have a good vascular supply to the penis and this is why it is cold.  He then became very agitated as he wanted me to prescribe something to him and his son stated that we would readdress that in 6 months.  PMH: Past Medical History:  Diagnosis Date  . Abnormal EKG   . Abnormal PSA   . BPH (benign prostatic hyperplasia)   . Dysuria   . Erectile dysfunction   . Gastric ulcer   . Goiter   . Hemorrhoid   . Inflammatory disease of prostate   . Irritation of perirectal skin   . Prostate cancer ([place])   . Rectal burning   . Skin lesion     Surgical History: Past Surgical History:  Procedure Laterality Date  . cryoablation of prostate  02/23/2006  . ESOPHAGOGASTRODUODENOSCOPY N/A 03/14/2015   Procedure: ESOPHAGOGASTRODUODENOSCOPY (EGD);  Surgeon: Hulen Luster, MD;  Location: [facility] ENDOSCOPY;  Service: Gastroenterology;  Laterality: N/A;   . THYROIDECTOMY     Subtotal    Home Medications:  Allergies as of 01/15/2021      Reactions   Ace Inhibitors    Peanuts [peanut Oil]    Penicillins    Sulfa Antibiotics       Medication List       Accurate as of January 15, 2021  3:12 PM. If you have any questions, ask your nurse or doctor.        amLODipine 10 MG tablet Commonly known as: NORVASC   aspirin 81 MG EC tablet Take 1 tablet (81 mg total) by mouth daily.   cholecalciferol 1000 units tablet Commonly known as: VITAMIN D Take 1,000 Units by mouth daily. Two tablets once daily   galantamine 8 MG 24 hr capsule Commonly known as: RAZADYNE ER Take by mouth.   memantine 10 MG tablet Commonly known as: NAMENDA Take 10 mg by mouth 2 (two) times daily.   potassium chloride SA 20 MEQ tablet Commonly known as: KLOR-CON Take 20 mEq by mouth 2 (two) times daily. Reported on 12/24/2015   tamsulosin 0.4 MG Caps capsule Commonly known as: FLOMAX Take 1 capsule (0.4 mg total) by mouth daily.   triamterene-hydrochlorothiazide 75-50 MG tablet Commonly known as: MAXZIDE Take 1 tablet by mouth daily.   vitamin B-12 1000 MCG tablet Commonly known  as: CYANOCOBALAMIN Take 1,000 mcg by mouth daily.       Allergies:  Allergies  Allergen Reactions  . Ace Inhibitors   . Peanuts [Peanut Oil]   . Penicillins   . Sulfa Antibiotics     Family History: Family History  Problem Relation Age of Onset  . Heart attack Mother   . Prostate cancer Brother   . Brain cancer Father        tumor unknown if cancer  . Heart attack Sister   . Kidney disease Neg Hx   . Kidney cancer Neg Hx   . Bladder Cancer Neg Hx     Social History:  reports that he has never smoked. He has never used smokeless tobacco. He reports that he does not drink alcohol and does not use drugs.  ROS For pertinent review of systems please refer to history of present illness  Physical Exam: BP 120/76   Pulse 69   Wt 130 lb (59 kg)   BMI 18.13  kg/m   Constitutional:  Well nourished. Alert and oriented, No acute distress. HEENT: [place] AT, mask in place.  Trachea midline Cardiovascular: No clubbing, cyanosis, or edema. Lymph: No inguinal adenopathy. Neurologic: Grossly intact, no focal deficits, moving all 4 extremities. Psychiatric: Normal mood and affect.   Laboratory Data: PSA trend:  Component     Latest Ref Rng & Units 06/19/2016 12/17/2016 06/21/2017 06/21/2018  Prostate Specific Ag, Serum     0.0 - 4.0 ng/mL 0.4 0.4 0.3 0.3   Component     Latest Ref Rng & Units 12/26/2018 06/30/2019 01/05/2020 07/04/2020  Prostate Specific Ag, Serum     0.0 - 4.0 ng/mL 0.3 0.3 0.3 0.4   Component     Latest Ref Rng & Units 01/07/2021  Prostate Specific Ag, Serum     0.0 - 4.0 ng/mL 0.4   Specimen:  Blood  Ref Range & Units 1 mo ago  Glucose 70 - 110 mg/dL 95   Sodium 136 - 145 mmol/L 145   Potassium 3.6 - 5.1 mmol/L 3.7   Chloride 97 - 109 mmol/L 102   Carbon Dioxide (CO2) 22.0 - 32.0 mmol/L 35.3High   Calcium 8.7 - 10.3 mg/dL 9.8   Urea Nitrogen (BUN) 7 - 25 mg/dL 22   Creatinine 0.7 - 1.3 mg/dL 1.3   Glomerular Filtration Rate (eGFR), MDRD Estimate >60 mL/min/1.73sq m 63   BUN/Crea Ratio 6.0 - 20.0 16.9   Anion Gap w/K 6.0 - 16.0 11.4   Resulting Agency  [facility] - [facility]  Specimen Collected: 11/21/20 9:30 AM Last Resulted: 11/21/20 2:00 PM  Received From: [facility]  Result Received: 01/01/21 4:11 PM   Specimen:  Blood  Ref Range & Units 1 mo ago  WBC (White Blood Cell Count) 4.1 - 10.2 10^3/uL 5.3   RBC (Red Blood Cell Count) 4.69 - 6.13 10^6/uL 5.99   Hemoglobin 14.1 - 18.1 gm/dL 15.6   Hematocrit 40.0 - 52.0 % 49.3   MCV (Mean Corpuscular Volume) 80.0 - 100.0 fl 82.3   MCH (Mean Corpuscular Hemoglobin) 27.0 - 31.2 pg 26.0Low   MCHC (Mean Corpuscular Hemoglobin Concentration) 32.0 - 36.0 gm/dL 31.6Low   Platelet Count 150 - 450 10^3/uL 175   RDW-CV (Red Cell Distribution Width) 11.6 -  14.8 % 13.7   MPV (Mean Platelet Volume) 9.4 - 12.4 fl 10.8   Neutrophils 1.50 - 7.80 10^3/uL 3.31   Lymphocytes 1.00 - 3.60 10^3/uL 1.47   Monocytes  0.00 - 1.50 10^3/uL 0.49   Eosinophils 0.00 - 0.55 10^3/uL 0.04   Basophils 0.00 - 0.09 10^3/uL 0.02   Neutrophil % 32.0 - 70.0 % 62.0   Lymphocyte % 10.0 - 50.0 % 27.5   Monocyte % 4.0 - 13.0 % 9.2   Eosinophil % 1.0 - 5.0 % 0.7Low   Basophil% 0.0 - 2.0 % 0.4   Immature Granulocyte % <=0.7 % 0.2   Immature Granulocyte Count <=0.06 10^3/L 0.01   Resulting Agency  [facility] - [facility]  Specimen Collected: 11/21/20 9:30 AM Last Resulted: 11/21/20 11:14 AM  Received From: [facility]  Result Received: 01/01/21 4:11 PM  I have reviewed the labs.  Pertinent Imaging Results for ELIYAHU, BILLE (MRN [id number]) as of 01/15/2021 14:59  Ref. Range 01/15/2021 14:43  Scan Result Unknown 166 ml    Assessment & Plan:    1. Prostate cancer  Underwent cryoablation of his prostate cancer in 2007 - iPSA was 5.3 ng/dL. PSA 0.4 ng/mL on 12/2020 RTC in 6 months for PSA  2. LUTS  RTC in 6 months for I PSS and PVR  3. Erectile dysfunction Defer treatments   Return in about 6 months (around 07/18/2021) for IPSS, PSA and exam.  [facility] [address] [place], [place] [address] [phone]

## 2021-01-15 ENCOUNTER — Ambulatory Visit (INDEPENDENT_AMBULATORY_CARE_PROVIDER_SITE_OTHER): Payer: Medicare Other | Admitting: Urology

## 2021-01-15 ENCOUNTER — Other Ambulatory Visit: Payer: Self-pay

## 2021-01-15 ENCOUNTER — Encounter: Payer: Self-pay | Admitting: Urology

## 2021-01-15 VITALS — BP 120/76 | HR 69 | Wt 130.0 lb

## 2021-01-15 DIAGNOSIS — C61 Malignant neoplasm of prostate: Secondary | ICD-10-CM | POA: Diagnosis not present

## 2021-01-15 DIAGNOSIS — R399 Unspecified symptoms and signs involving the genitourinary system: Secondary | ICD-10-CM | POA: Diagnosis not present

## 2021-01-15 LAB — BLADDER SCAN AMB NON-IMAGING: Scan Result: 166

## 2021-04-14 ENCOUNTER — Ambulatory Visit: Payer: Self-pay | Admitting: *Deleted

## 2021-04-14 NOTE — Telephone Encounter (Signed)
Patient daughter , Jordan Hawks (on [place]), is calling to speak with the nurse regarding the patient's left foot that the swollen- and the toe nails under the foot is black. Should patient soak the foot in Epsom salt or vinegar? Patient 's daughter wondering should the patient be scheduled a virtual visit with UC? Called patient and no answer. Left voicemail to call [phone]. Called patient 's daughter, [place]. Patient and daughter on speaker phone answering question. C/o left ankle swelling more than the right ankle swelling. Bilateral ankles look discolored. Denies bilateral feet swollen. Daughter reports "blackened" area noted under patient 's right great toe nail on tip of toe x 2 months. Skin intact. Denies redness to right great toe or foot. Warm to touch not hot. Tender to touch per patient. Reports he has not injured foot or toe . Denies fever or any other symptoms . Patient able to walk on feet. Daughter requesting if ok to soak patient's feet in Epsom salt or vinegar. Encouraged patient to consult PCP. Skin intact to toes and feet, able to choose epsom salt or vinegar. Wife requesting to cut patient's toenails. Ok to cut patient's toenails and can cut after soaking to allow softening of nails , not to cut right great toenail until assessed by provider. Care advise given. Patient and daughter verbalized understanding of care advise and to call back or go to [facility] or ED if symptoms worsen.

## 2021-04-14 NOTE — Telephone Encounter (Signed)
Reason for Disposition  Minor bruise or swelling  Answer Assessment - Initial Assessment Questions 1. APPEARANCE of INJURY: "What does the injury look like?"      Discolored "black" under right toe nail at end of toe 2. SIZE: "How large is the cut?"      No cut  3. BLEEDING: "Is it bleeding now?" If Yes, ask: "Is it difficult to stop?"      No bleeding  4. LOCATION: "Where is the injury located?"      Blackened area on end of great toe 5. ONSET: "How long ago did the injury occur?"      No injury . Noticed over 2 months ago  6. MECHANISM: "Tell me how it happened."      na 7. TETANUS: "When was the last tetanus booster?"     na 8. PREGNANCY: "Is there any chance you are pregnant?" "When was your last menstrual period?"     na  Protocols used: Skin Injury-A-AH

## 2021-05-09 ENCOUNTER — Other Ambulatory Visit: Payer: Self-pay

## 2021-05-09 ENCOUNTER — Ambulatory Visit: Payer: Medicare Other | Admitting: Nurse Practitioner

## 2021-05-09 ENCOUNTER — Ambulatory Visit (INDEPENDENT_AMBULATORY_CARE_PROVIDER_SITE_OTHER): Payer: Medicare Other | Admitting: Nurse Practitioner

## 2021-05-09 ENCOUNTER — Encounter: Payer: Self-pay | Admitting: Nurse Practitioner

## 2021-05-09 VITALS — BP 140/78 | HR 76 | Temp 97.8°F

## 2021-05-09 DIAGNOSIS — G309 Alzheimer's disease, unspecified: Secondary | ICD-10-CM | POA: Diagnosis not present

## 2021-05-09 DIAGNOSIS — Z7689 Persons encountering health services in other specified circumstances: Secondary | ICD-10-CM

## 2021-05-09 DIAGNOSIS — R221 Localized swelling, mass and lump, neck: Secondary | ICD-10-CM | POA: Diagnosis not present

## 2021-05-09 DIAGNOSIS — F0280 Dementia in other diseases classified elsewhere without behavioral disturbance: Secondary | ICD-10-CM

## 2021-05-09 DIAGNOSIS — C61 Malignant neoplasm of prostate: Secondary | ICD-10-CM

## 2021-05-09 DIAGNOSIS — I10 Essential (primary) hypertension: Secondary | ICD-10-CM

## 2021-05-09 DIAGNOSIS — K649 Unspecified hemorrhoids: Secondary | ICD-10-CM

## 2021-05-09 MED ORDER — AMLODIPINE BESYLATE 10 MG PO TABS: 10.0000 mg | ORAL_TABLET | Freq: Every day | ORAL | 1 refills | Status: DC

## 2021-05-09 MED ORDER — POTASSIUM CHLORIDE CRYS ER 20 MEQ PO TBCR: 20.0000 meq | EXTENDED_RELEASE_TABLET | Freq: Two times a day (BID) | ORAL | 1 refills | Status: DC

## 2021-05-09 MED ORDER — TRIAMTERENE-HCTZ 75-50 MG PO TABS: 1.0000 | ORAL_TABLET | Freq: Every day | ORAL | 1 refills | Status: DC

## 2021-05-09 MED ORDER — MEMANTINE HCL 10 MG PO TABS: 10.0000 mg | ORAL_TABLET | Freq: Two times a day (BID) | ORAL | 1 refills | Status: DC

## 2021-05-09 NOTE — Progress Notes (Signed)
BP 140/78   Pulse 76   Temp 97.8 F (36.6 C)   SpO2 99%    Subjective:    Patient ID: Ian Parker, male    DOB: 1931/01/27, 87 y.o.   MRN: [id number]  HPI: Ian Parker is a 87 y.o. male  Chief Complaint  Patient presents with   Establish Care   Foot Problem    Bilateral discoloration from ankle down. Was swollen and painful   Mass    Left side of neck     Patient presents to clinic to establish care with new PCP.  Patient reports a history of Hypertension, high cholesterol, hx of prostate cancer 2007, constipation, Dementia- seen by Neurologist Dr. Sunday Corn.  Patient denies a history of:Thyroid problems, Diabetes, Depression, Anxiety, and Abdominal problems.   Patient is also having some discomfort in his rectal area.  He isn't able to describe the pain but states that he just knows something isn't right.    Patient's son states that he was having discoloration on both feet.  They were swollen a couple of days ago and had a red/purple color.  Patient's family says the symptoms have resolved and his legs are back to baseline.    Active Ambulatory Problems    Diagnosis Date Noted   Benign essential HTN 04/25/2014   H/O nutritional disorder 08/21/2014   Peripheral neuropathy ([place]) 08/21/2014   H/O malignant neoplasm of prostate 08/21/2014   Prostate cancer ([place]) 06/26/2015   ED (erectile dysfunction) of organic origin 06/26/2015   Hemorrhoids 06/26/2015   Benign prostatic hypertrophy with lower urinary tract symptoms (LUTS) 06/26/2015   Anemia due to vitamin B12 deficiency 07/12/2015   Altered mental status 08/07/2019   Alzheimer's dementia without behavioral disturbance ([place]) 03/30/2018   Resolved Ambulatory Problems    Diagnosis Date Noted   No Resolved Ambulatory Problems   Past Medical History:  Diagnosis Date   Abnormal EKG    Abnormal PSA    BPH (benign prostatic hyperplasia)    Dysuria    Erectile dysfunction    Gastric ulcer    Goiter    Hemorrhoid     Inflammatory disease of prostate    Irritation of perirectal skin    Rectal burning    Skin lesion    Past Surgical History:  Procedure Laterality Date   cryoablation of prostate  02/23/2006   ESOPHAGOGASTRODUODENOSCOPY N/A 03/14/2015   Procedure: ESOPHAGOGASTRODUODENOSCOPY (EGD);  Surgeon: Hulen Luster, MD;  Location: [facility] ENDOSCOPY;  Service: Gastroenterology;  Laterality: N/A;   THYROIDECTOMY     Subtotal   Family History  Problem Relation Age of Onset   Heart attack Mother    Brain cancer Father        tumor unknown if cancer   Diabetes Sister    Hypertension Sister    Hyperlipidemia Sister    Heart attack Sister    Prostate cancer Brother    Kidney disease Neg Hx    Kidney cancer Neg Hx    Bladder Cancer Neg Hx     Allergies and medications reviewed and updated.  Review of Systems  HENT:         Lump on neck  Cardiovascular:  Positive for leg swelling.  Gastrointestinal:        Anal discomfort.   Per HPI unless specifically indicated above     Objective:    BP 140/78   Pulse 76   Temp 97.8 F (36.6 C)   SpO2 99%  Wt Readings from Last 3 Encounters:  01/15/21 130 lb (59 kg)  08/14/20 155 lb 3.2 oz (70.4 kg)  07/12/20 145 lb (65.8 kg)    Physical Exam Vitals and nursing note reviewed. Exam conducted with a chaperone present (Destiny [place], CMA).  Constitutional:      General: He is not in acute distress.    Appearance: Normal appearance. He is not ill-appearing, toxic-appearing or diaphoretic.  HENT:     Head: Normocephalic.     Right Ear: External ear normal.     Left Ear: External ear normal.     Nose: Nose normal. No congestion or rhinorrhea.     Mouth/Throat:     Mouth: Mucous membranes are moist.  Eyes:     General:        Right eye: No discharge.        Left eye: No discharge.     Extraocular Movements: Extraocular movements intact.     Conjunctiva/sclera: Conjunctivae normal.     Pupils: Pupils are equal, round, and reactive to light.   Neck:     Thyroid: Thyroid mass present. No thyroid tenderness.  Cardiovascular:     Rate and Rhythm: Normal rate and regular rhythm.     Heart sounds: No murmur heard. Pulmonary:     Effort: Pulmonary effort is normal. No respiratory distress.     Breath sounds: Normal breath sounds. No wheezing, rhonchi or rales.  Abdominal:     General: Abdomen is flat. Bowel sounds are normal.  Genitourinary:    Rectum: External hemorrhoid present.  Musculoskeletal:     Cervical back: Full passive range of motion without pain, normal range of motion and neck supple.  Skin:    General: Skin is warm and dry.     Capillary Refill: Capillary refill takes less than 2 seconds.  Neurological:     General: No focal deficit present.     Mental Status: He is alert and oriented to person, place, and time.  Psychiatric:        Mood and Affect: Mood normal.        Behavior: Behavior normal.        Thought Content: Thought content normal.        Judgment: Judgment normal.    Results for orders placed or performed in visit on 01/15/21  BLADDER SCAN AMB NON-IMAGING  Result Value Ref Range   Scan Result 166 ml       Assessment & Plan:   Problem List Items Addressed This Visit       Cardiovascular and Mediastinum   Benign essential HTN    Chronic.  Controlled.  Continue with current medication regimen.  Labs ordered today.  Return to clinic in 3 months for reevaluation.  Call sooner if concerns arise. Refills sent today.         Relevant Medications   amLODipine (NORVASC) 10 MG tablet   triamterene-hydrochlorothiazide (MAXZIDE) 75-50 MG tablet   Other Relevant Orders   Comp Met (CMET)   Hemorrhoids    Recommend using preparation H PRN for hemorrhoid discomfort.        Relevant Medications   amLODipine (NORVASC) 10 MG tablet   triamterene-hydrochlorothiazide (MAXZIDE) 75-50 MG tablet     Nervous and Auditory   Alzheimer's dementia without behavioral disturbance ([place])    Followed by  Neurology.  Continue to follow their recommendations.        Relevant Medications   memantine (NAMENDA) 10 MG tablet   Other Relevant  Orders   Comp Met (CMET)     Genitourinary   Prostate cancer ([place]) - Primary    Diagnosed and treated in 2007.  Last PSA in March 2022 was 0.4.  Followed by Urology. Completed treatment in 2007.       Relevant Orders   Comp Met (CMET)   Other Visit Diagnoses     Localized swelling, mass or lump of neck       [place] ordered of the thyroid to rule out nodule/mass. Will make recommendations based on imaging results.    Relevant Orders   US THYROID   Encounter to establish care            Follow up plan: Return in about 3 months (around 08/09/2021) for HTN, HLD, DM2 FU.

## 2021-05-09 NOTE — Assessment & Plan Note (Signed)
Recommend using preparation H PRN for hemorrhoid discomfort.

## 2021-05-09 NOTE — Assessment & Plan Note (Signed)
Diagnosed and treated in 2007.  Last PSA in March 2022 was 0.4.  Followed by Urology. Completed treatment in 2007.

## 2021-05-09 NOTE — Assessment & Plan Note (Signed)
Chronic.  Controlled.  Continue with current medication regimen.  Labs ordered today.  Return to clinic in 3 months for reevaluation.  Call sooner if concerns arise. Refills sent today.

## 2021-05-09 NOTE — Assessment & Plan Note (Signed)
Followed by Neurology.  Continue to follow their recommendations.

## 2021-05-10 LAB — COMPREHENSIVE METABOLIC PANEL
ALT: 25 IU/L (ref 0–44)
AST: 18 IU/L (ref 0–40)
Albumin/Globulin Ratio: 2.3 — ABNORMAL HIGH (ref 1.2–2.2)
Albumin: 5 g/dL — ABNORMAL HIGH (ref 3.6–4.6)
Alkaline Phosphatase: 70 IU/L (ref 44–121)
BUN/Creatinine Ratio: 14 (ref 10–24)
BUN: 19 mg/dL (ref 8–27)
Bilirubin Total: 0.4 mg/dL (ref 0.0–1.2)
CO2: 26 mmol/L (ref 20–29)
Calcium: 9.7 mg/dL (ref 8.6–10.2)
Chloride: 100 mmol/L (ref 96–106)
Creatinine, Ser: 1.32 mg/dL — ABNORMAL HIGH (ref 0.76–1.27)
Globulin, Total: 2.2 g/dL (ref 1.5–4.5)
Glucose: 86 mg/dL (ref 65–99)
Potassium: 3.3 mmol/L — ABNORMAL LOW (ref 3.5–5.2)
Sodium: 146 mmol/L — ABNORMAL HIGH (ref 134–144)
Total Protein: 7.2 g/dL (ref 6.0–8.5)
eGFR: 52 mL/min/{1.73_m2} — ABNORMAL LOW (ref >59)

## 2021-05-12 NOTE — Progress Notes (Signed)
Please call patient's son and let him know that patient's lab work shows that he has some chronic kidney disease.  It looks like this has been consistent with prior.  At this point, we will continue to monitor this in the future. I recommend avoiding giving the patient NSAIDS (motrin, ibuprofen, aleve).  His Potassium is a little bit low.  Continue with the consistent. We will continue to monitor these at future visits.

## 2021-05-15 ENCOUNTER — Ambulatory Visit: Payer: Medicare Other | Attending: Nurse Practitioner

## 2021-06-23 ENCOUNTER — Other Ambulatory Visit: Payer: Self-pay

## 2021-06-23 DIAGNOSIS — C61 Malignant neoplasm of prostate: Secondary | ICD-10-CM

## 2021-07-14 ENCOUNTER — Other Ambulatory Visit: Payer: Medicare Other

## 2021-07-14 ENCOUNTER — Other Ambulatory Visit: Payer: Self-pay

## 2021-07-14 DIAGNOSIS — C61 Malignant neoplasm of prostate: Secondary | ICD-10-CM

## 2021-07-15 LAB — PSA: Prostate Specific Ag, Serum: 0.4 ng/mL (ref 0.0–4.0)
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
## 2021-07-17 ENCOUNTER — Other Ambulatory Visit: Payer: Self-pay

## 2021-07-17 ENCOUNTER — Ambulatory Visit (INDEPENDENT_AMBULATORY_CARE_PROVIDER_SITE_OTHER): Payer: Medicare Other | Admitting: Urology

## 2021-07-17 ENCOUNTER — Encounter: Payer: Self-pay | Admitting: Urology

## 2021-07-17 VITALS — BP 116/69 | HR 84 | Ht 70.0 in | Wt 157.0 lb

## 2021-07-17 DIAGNOSIS — R399 Unspecified symptoms and signs involving the genitourinary system: Secondary | ICD-10-CM

## 2021-07-17 DIAGNOSIS — N401 Enlarged prostate with lower urinary tract symptoms: Secondary | ICD-10-CM | POA: Diagnosis not present

## 2021-07-17 DIAGNOSIS — C61 Malignant neoplasm of prostate: Secondary | ICD-10-CM

## 2021-07-17 DIAGNOSIS — N138 Other obstructive and reflux uropathy: Secondary | ICD-10-CM | POA: Diagnosis not present

## 2021-07-17 LAB — BLADDER SCAN AMB NON-IMAGING: Scan Result: 6

## 2021-08-08 ENCOUNTER — Ambulatory Visit: Payer: Medicare Other

## 2021-08-11 ENCOUNTER — Ambulatory Visit (INDEPENDENT_AMBULATORY_CARE_PROVIDER_SITE_OTHER): Payer: Medicare Other | Admitting: Nurse Practitioner

## 2021-08-11 ENCOUNTER — Other Ambulatory Visit: Payer: Self-pay

## 2021-08-11 ENCOUNTER — Encounter: Payer: Self-pay | Admitting: Nurse Practitioner

## 2021-08-11 VITALS — BP 142/82 | HR 66 | Ht 70.0 in | Wt 158.0 lb

## 2021-08-11 DIAGNOSIS — F0280 Dementia in other diseases classified elsewhere without behavioral disturbance: Secondary | ICD-10-CM | POA: Diagnosis not present

## 2021-08-11 DIAGNOSIS — G309 Alzheimer's disease, unspecified: Secondary | ICD-10-CM | POA: Diagnosis not present

## 2021-08-11 DIAGNOSIS — I10 Essential (primary) hypertension: Secondary | ICD-10-CM | POA: Diagnosis not present

## 2021-08-11 NOTE — Assessment & Plan Note (Signed)
Chronic.  Controlled.  Continue with current medication regimen with Mamantine and galantamine.  Labs ordered today.  Return to clinic in 3 months for reevaluation.  Call sooner if concerns arise.

## 2021-08-11 NOTE — Assessment & Plan Note (Signed)
Chronic.  Controlled.  Continue with current medication regimen of Amlodipine 10mg  and Maxide.  Labs ordered today.  Return to clinic in 3 months for reevaluation.  Call sooner if concerns arise.

## 2021-08-11 NOTE — Progress Notes (Signed)
BP (!) 142/82   Pulse 66   Ht 5' 10"  (1.778 m)   Wt 158 lb (71.7 kg)   BMI 22.67 kg/m    Subjective:    Patient ID: Ian Parker, male    DOB: 04-Mar-1931, 87 y.o.   MRN: [id number]  HPI: Ian Parker is a 87 y.o. male  Chief Complaint  Patient presents with   Hypertension   HYPERTENSION Hypertension status: controlled  Satisfied with current treatment? yes Duration of hypertension: years BP monitoring frequency:  not checking BP range:  BP medication side effects:  no Medication compliance: excellent compliance Previous BP meds:amlodipine and Maxide Aspirin: no Recurrent headaches: no Visual changes: no Palpitations: no Dyspnea: no Chest pain: no Lower extremity edema: no Dizzy/lightheaded: no  MEMORY Patient and wife deny concerns at visit today.  Has been taking the Memantine and Galantamine for his memory.    Relevant past medical, surgical, family and social history reviewed and updated as indicated. Interim medical history since our last visit reviewed. Allergies and medications reviewed and updated.  Review of Systems  Eyes:  Negative for visual disturbance.  Respiratory:  Negative for shortness of breath.   Cardiovascular:  Negative for chest pain and leg swelling.  Neurological:  Negative for light-headedness and headaches.   Per HPI unless specifically indicated above     Objective:    BP (!) 142/82   Pulse 66   Ht 5' 10"  (1.778 m)   Wt 158 lb (71.7 kg)   BMI 22.67 kg/m   Wt Readings from Last 3 Encounters:  08/11/21 158 lb (71.7 kg)  07/17/21 157 lb (71.2 kg)  01/15/21 130 lb (59 kg)    Physical Exam Vitals and nursing note reviewed.  Constitutional:      General: He is not in acute distress.    Appearance: Normal appearance. He is not ill-appearing, toxic-appearing or diaphoretic.  HENT:     Head: Normocephalic.     Right Ear: External ear normal.     Left Ear: External ear normal.     Nose: Nose normal. No congestion or  rhinorrhea.     Mouth/Throat:     Mouth: Mucous membranes are moist.  Eyes:     General:        Right eye: No discharge.        Left eye: No discharge.     Extraocular Movements: Extraocular movements intact.     Conjunctiva/sclera: Conjunctivae normal.     Pupils: Pupils are equal, round, and reactive to light.  Cardiovascular:     Rate and Rhythm: Normal rate and regular rhythm.     Heart sounds: No murmur heard. Pulmonary:     Effort: Pulmonary effort is normal. No respiratory distress.     Breath sounds: Normal breath sounds. No wheezing, rhonchi or rales.  Abdominal:     General: Abdomen is flat. Bowel sounds are normal.  Musculoskeletal:     Cervical back: Normal range of motion and neck supple.  Skin:    General: Skin is warm and dry.     Capillary Refill: Capillary refill takes less than 2 seconds.  Neurological:     General: No focal deficit present.     Mental Status: He is alert and oriented to person, place, and time.  Psychiatric:        Mood and Affect: Mood normal.        Behavior: Behavior normal.        Thought Content: Thought  content normal.        Judgment: Judgment normal.    Results for orders placed or performed in visit on 07/17/21  BLADDER SCAN AMB NON-IMAGING  Result Value Ref Range   Scan Result 6 ml       Assessment & Plan:   Problem List Items Addressed This Visit       Cardiovascular and Mediastinum   Benign essential HTN - Primary    Chronic.  Controlled.  Continue with current medication regimen of Amlodipine 54m and Maxide.  Labs ordered today.  Return to clinic in 3 months for reevaluation.  Call sooner if concerns arise.        Relevant Orders   Comp Met (CMET)     Nervous and Auditory   Alzheimer's dementia without behavioral disturbance (HCC)    Chronic.  Controlled.  Continue with current medication regimen with Mamantine and galantamine.  Labs ordered today.  Return to clinic in 3 months for reevaluation.  Call sooner if  concerns arise.        Relevant Orders   Comp Met (CMET)     Genitourinary   [place]     Follow up plan: Return in about 3 months (around 11/11/2021) for Blood pressure and Potassium check.

## 2021-08-12 LAB — COMPREHENSIVE METABOLIC PANEL
ALT: 27 IU/L (ref 0–44)
AST: 24 IU/L (ref 0–40)
Albumin/Globulin Ratio: 2.2 (ref 1.2–2.2)
Albumin: 4.9 g/dL — ABNORMAL HIGH (ref 3.6–4.6)
Alkaline Phosphatase: 67 IU/L (ref 44–121)
BUN/Creatinine Ratio: 13 (ref 10–24)
BUN: 18 mg/dL (ref 8–27)
Bilirubin Total: 0.3 mg/dL (ref 0.0–1.2)
CO2: 24 mmol/L (ref 20–29)
Calcium: 9.6 mg/dL (ref 8.6–10.2)
Chloride: 100 mmol/L (ref 96–106)
Creatinine, Ser: 1.35 mg/dL — ABNORMAL HIGH (ref 0.76–1.27)
Globulin, Total: 2.2 g/dL (ref 1.5–4.5)
Glucose: 93 mg/dL (ref 70–99)
Potassium: 3.9 mmol/L (ref 3.5–5.2)
Sodium: 143 mmol/L (ref 134–144)
Total Protein: 7.1 g/dL (ref 6.0–8.5)
eGFR: 50 mL/min/{1.73_m2} — ABNORMAL LOW (ref >59)

## 2021-08-12 NOTE — Progress Notes (Signed)
Please let patient know that his lab work remains stable.  He should continue the potassium supplement.  We will continue to check labs at future visits.

## 2021-11-11 ENCOUNTER — Other Ambulatory Visit: Payer: Self-pay

## 2021-11-11 ENCOUNTER — Ambulatory Visit (INDEPENDENT_AMBULATORY_CARE_PROVIDER_SITE_OTHER): Payer: Medicare Other | Admitting: Nurse Practitioner

## 2021-11-11 ENCOUNTER — Encounter: Payer: Self-pay | Admitting: Nurse Practitioner

## 2021-11-11 VITALS — BP 130/67 | HR 76 | Temp 97.7°F | Ht 70.0 in | Wt 160.2 lb

## 2021-11-11 DIAGNOSIS — F0280 Dementia in other diseases classified elsewhere without behavioral disturbance: Secondary | ICD-10-CM

## 2021-11-11 DIAGNOSIS — I10 Essential (primary) hypertension: Secondary | ICD-10-CM | POA: Diagnosis not present

## 2021-11-11 DIAGNOSIS — G309 Alzheimer's disease, unspecified: Secondary | ICD-10-CM | POA: Diagnosis not present

## 2021-11-11 DIAGNOSIS — E876 Hypokalemia: Secondary | ICD-10-CM | POA: Diagnosis not present

## 2021-11-11 MED ORDER — AMLODIPINE BESYLATE 10 MG PO TABS: 10.0000 mg | ORAL_TABLET | Freq: Every day | ORAL | 1 refills | Status: DC

## 2021-11-11 MED ORDER — MEMANTINE HCL 10 MG PO TABS: 10.0000 mg | ORAL_TABLET | Freq: Two times a day (BID) | ORAL | 1 refills | Status: DC

## 2021-11-11 MED ORDER — POTASSIUM CHLORIDE CRYS ER 20 MEQ PO TBCR: 20.0000 meq | EXTENDED_RELEASE_TABLET | Freq: Two times a day (BID) | ORAL | 1 refills | Status: DC

## 2021-11-11 MED ORDER — TRIAMTERENE-HCTZ 75-50 MG PO TABS: 1.0000 | ORAL_TABLET | Freq: Every day | ORAL | 1 refills | Status: DC

## 2021-11-11 MED ORDER — HYDROCORTISONE (PERIANAL) 2.5 % EX CREA: 1.0000 "application " | TOPICAL_CREAM | Freq: Two times a day (BID) | CUTANEOUS | 1 refills | Status: DC

## 2021-11-11 NOTE — Assessment & Plan Note (Signed)
Chronic.  Controlled.  Continue with current medication regimen on Amlodipine 10mg  and Maxide 75/50mg  daily.  Refills sent today.  Labs ordered today.  Return to clinic in 3 months for reevaluation.  Call sooner if concerns arise.

## 2021-11-11 NOTE — Progress Notes (Signed)
BP 130/67    Pulse 76    Temp 97.7 F (36.5 C) (Oral)    Ht 5' 10"  (1.778 m)    Wt 160 lb 3.2 oz (72.7 kg)    SpO2 99%    BMI 22.99 kg/m    Subjective:    Patient ID: Ian Parker, male    DOB: 04-Nov-1930, 88 y.o.   MRN: [id number]  HPI: Ian Parker is a 88 y.o. male  Chief Complaint  Patient presents with   Hypertension   HYPERTENSION Hypertension status: controlled  Satisfied with current treatment? yes Duration of hypertension: years BP monitoring frequency:  not checking BP range:  BP medication side effects:  no Medication compliance: excellent compliance Previous BP meds: amlodipine and Maxide Aspirin: no Recurrent headaches: no Visual changes: no Palpitations: no Dyspnea: no Chest pain: no Lower extremity edema: no Dizzy/lightheaded: no  MEMORY Patient and wife deny concerns at visit today.  Has been taking the Memantine and Galantamine for his memory.  States he is doing well. Denies behavior concerns.  Patient is sleeping well at night.  Relevant past medical, surgical, family and social history reviewed and updated as indicated. Interim medical history since our last visit reviewed. Allergies and medications reviewed and updated.  Review of Systems  Eyes:  Negative for visual disturbance.  Respiratory:  Negative for shortness of breath.   Cardiovascular:  Negative for chest pain and leg swelling.  Neurological:  Negative for light-headedness and headaches.   Per HPI unless specifically indicated above     Objective:    BP 130/67    Pulse 76    Temp 97.7 F (36.5 C) (Oral)    Ht 5' 10"  (1.778 m)    Wt 160 lb 3.2 oz (72.7 kg)    SpO2 99%    BMI 22.99 kg/m   Wt Readings from Last 3 Encounters:  11/11/21 160 lb 3.2 oz (72.7 kg)  08/11/21 158 lb (71.7 kg)  07/17/21 157 lb (71.2 kg)    Physical Exam Vitals and nursing note reviewed.  Constitutional:      General: He is not in acute distress.    Appearance: Normal appearance. He is not  ill-appearing, toxic-appearing or diaphoretic.  HENT:     Head: Normocephalic.     Right Ear: External ear normal.     Left Ear: External ear normal.     Nose: Nose normal. No congestion or rhinorrhea.     Mouth/Throat:     Mouth: Mucous membranes are moist.  Eyes:     General:        Right eye: No discharge.        Left eye: No discharge.     Extraocular Movements: Extraocular movements intact.     Conjunctiva/sclera: Conjunctivae normal.     Pupils: Pupils are equal, round, and reactive to light.  Cardiovascular:     Rate and Rhythm: Normal rate and regular rhythm.     Heart sounds: No murmur heard. Pulmonary:     Effort: Pulmonary effort is normal. No respiratory distress.     Breath sounds: Normal breath sounds. No wheezing, rhonchi or rales.  Abdominal:     General: Abdomen is flat. Bowel sounds are normal.  Musculoskeletal:     Cervical back: Normal range of motion and neck supple.  Skin:    General: Skin is warm and dry.     Capillary Refill: Capillary refill takes less than 2 seconds.  Neurological:  General: No focal deficit present.     Mental Status: He is alert and oriented to person, place, and time.  Psychiatric:        Mood and Affect: Mood normal.        Behavior: Behavior normal.        Thought Content: Thought content normal.        Judgment: Judgment normal.    Results for orders placed or performed in visit on 08/11/21  Comp Met (CMET)  Result Value Ref Range   Glucose 93 70 - 99 mg/dL   BUN 18 8 - 27 mg/dL   Creatinine, Ser 1.35 (H) 0.76 - 1.27 mg/dL   eGFR 50 (L) >59 mL/min/1.73   BUN/Creatinine Ratio 13 10 - 24   Sodium 143 134 - 144 mmol/L   Potassium 3.9 3.5 - 5.2 mmol/L   Chloride 100 96 - 106 mmol/L   CO2 24 20 - 29 mmol/L   Calcium 9.6 8.6 - 10.2 mg/dL   Total Protein 7.1 6.0 - 8.5 g/dL   Albumin 4.9 (H) 3.6 - 4.6 g/dL   Globulin, Total 2.2 1.5 - 4.5 g/dL   Albumin/Globulin Ratio 2.2 1.2 - 2.2   Bilirubin Total 0.3 0.0 - 1.2 mg/dL    Alkaline Phosphatase 67 44 - 121 IU/L   AST 24 0 - 40 IU/L   ALT 27 0 - 44 IU/L      Assessment & Plan:   Problem List Items Addressed This Visit       Cardiovascular and Mediastinum   Benign essential HTN    Chronic.  Controlled.  Continue with current medication regimen on Amlodipine 30m and Maxide 75/587mdaily.  Refills sent today.  Labs ordered today.  Return to clinic in 3 months for reevaluation.  Call sooner if concerns arise.        Relevant Medications   amLODipine (NORVASC) 10 MG tablet   triamterene-hydrochlorothiazide (MAXZIDE) 75-50 MG tablet     Nervous and Auditory   Alzheimer's dementia without behavioral disturbance (HCC) - Primary    Chronic.  Controlled.  Continue with current medication regimen with Mamantine and galantamine.  Labs ordered today.  Refills sent today. Return to clinic in 3 months for reevaluation.  Call sooner if concerns arise.        Relevant Medications   memantine (NAMENDA) 10 MG tablet   Other Visit Diagnoses     Hypokalemia       Labs ordered today. Will make recommendations based on lab results.   Relevant Orders   Comp Met (CMET)        Follow up plan: Return in about 3 months (around 02/09/2022) for BP Check and Potassium check.

## 2021-11-11 NOTE — Assessment & Plan Note (Signed)
Chronic.  Controlled.  Continue with current medication regimen with Mamantine and galantamine.  Labs ordered today.  Refills sent today. Return to clinic in 3 months for reevaluation.  Call sooner if concerns arise.

## 2021-11-12 LAB — COMPREHENSIVE METABOLIC PANEL
ALT: 22 IU/L (ref 0–44)
AST: 21 IU/L (ref 0–40)
Albumin/Globulin Ratio: 2.4 — ABNORMAL HIGH (ref 1.2–2.2)
Albumin: 5 g/dL — ABNORMAL HIGH (ref 3.6–4.6)
Alkaline Phosphatase: 66 IU/L (ref 44–121)
BUN/Creatinine Ratio: 12 (ref 10–24)
BUN: 14 mg/dL (ref 8–27)
Bilirubin Total: 0.5 mg/dL (ref 0.0–1.2)
CO2: 30 mmol/L — ABNORMAL HIGH (ref 20–29)
Calcium: 9.7 mg/dL (ref 8.6–10.2)
Chloride: 101 mmol/L (ref 96–106)
Creatinine, Ser: 1.2 mg/dL (ref 0.76–1.27)
Globulin, Total: 2.1 g/dL (ref 1.5–4.5)
Glucose: 106 mg/dL — ABNORMAL HIGH (ref 70–99)
Potassium: 3.6 mmol/L (ref 3.5–5.2)
Sodium: 145 mmol/L — ABNORMAL HIGH (ref 134–144)
Total Protein: 7.1 g/dL (ref 6.0–8.5)
eGFR: 58 mL/min/{1.73_m2} — ABNORMAL LOW (ref >59)

## 2021-11-12 NOTE — Progress Notes (Signed)
Please let patient know that his lab work looks good.  Potassium is within normal range.  Continue with potassium supplement.  Follow up as discussed.

## 2021-12-05 ENCOUNTER — Telehealth: Payer: Self-pay | Admitting: Nurse Practitioner

## 2021-12-05 NOTE — Telephone Encounter (Signed)
Copied from [place] [phone]. Topic: Medicare AWV >> Dec 05, 2021  2:18 PM Ian Parker wrote: Reason for CRM:    Left message for patient to call back and schedule Medicare Annual Wellness Visit (AWV) to be done virtually or by telephone.  No hx of AWV eligible as of 05/02/21  Please schedule at anytime with [facility]-Nurse Health Advisor.      30 Minutes appointment   Any questions, please call me at [phone]

## 2022-02-16 NOTE — Progress Notes (Deleted)
   There were no vitals taken for this visit.   Subjective:    Patient ID: Ian Parker, male    DOB: [birth year], 88 y.o.   MRN: [id number]  HPI: Ian Parker is a 88 y.o. male  No chief complaint on file.  HYPERTENSION Hypertension status: {Blank single:19197::"controlled","uncontrolled","better","worse","exacerbated","stable"}  Satisfied with current treatment? {Blank single:19197::"yes","no"} Duration of hypertension: {Blank single:19197::"chronic","months","years"} BP monitoring frequency:  {Blank single:19197::"not checking","rarely","daily","weekly","monthly","a few times a day","a few times a week","a few times a month"} BP range:  BP medication side effects:  {Blank single:19197::"yes","no"} Medication compliance: {Blank single:19197::"excellent compliance","good compliance","fair compliance","poor compliance"} Previous BP meds:{Blank CCEQFDVO:45146::"IQNV","VYXAJLUNGB","MBOMQTTCNG/FREVQWQVLD","KCCQFJUV","QQUIVHOYWV","XUCJARWPTY/YPEJ","YLTEIHDTPN (bystolic)","carvedilol","chlorthalidone","clonidine","diltiazem","exforge HCT","HCTZ","irbesartan (avapro)","labetalol","lisinopril","lisinopril-HCTZ","losartan (cozaar)","methyldopa","nifedipine","olmesartan (benicar)","olmesartan-HCTZ","quinapril","ramipril","spironalactone","tekturna","valsartan","valsartan-HCTZ","verapamil"} Aspirin: {Blank single:19197::"yes","no"} Recurrent headaches: {Blank single:19197::"yes","no"} Visual changes: {Blank single:19197::"yes","no"} Palpitations: {Blank single:19197::"yes","no"} Dyspnea: {Blank single:19197::"yes","no"} Chest pain: {Blank single:19197::"yes","no"} Lower extremity edema: {Blank single:19197::"yes","no"} Dizzy/lightheaded: {Blank single:19197::"yes","no"}  Relevant past medical, surgical, family and social history reviewed and updated as indicated. Interim medical history since our last visit reviewed. Allergies and medications reviewed and updated.  Review of Systems  Per HPI  unless specifically indicated above     Objective:    There were no vitals taken for this visit.  Wt Readings from Last 3 Encounters:  11/11/21 160 lb 3.2 oz (72.7 kg)  08/11/21 158 lb (71.7 kg)  07/17/21 157 lb (71.2 kg)    Physical Exam  Results for orders placed or performed in visit on 11/11/21  Comp Met (CMET)  Result Value Ref Range   Glucose 106 (H) 70 - 99 mg/dL   BUN 14 8 - 27 mg/dL   Creatinine, Ser 1.20 0.76 - 1.27 mg/dL   eGFR 58 (L) >59 mL/min/1.73   BUN/Creatinine Ratio 12 10 - 24   Sodium 145 (H) 134 - 144 mmol/L   Potassium 3.6 3.5 - 5.2 mmol/L   Chloride 101 96 - 106 mmol/L   CO2 30 (H) 20 - 29 mmol/L   Calcium 9.7 8.6 - 10.2 mg/dL   Total Protein 7.1 6.0 - 8.5 g/dL   Albumin 5.0 (H) 3.6 - 4.6 g/dL   Globulin, Total 2.1 1.5 - 4.5 g/dL   Albumin/Globulin Ratio 2.4 (H) 1.2 - 2.2   Bilirubin Total 0.5 0.0 - 1.2 mg/dL   Alkaline Phosphatase 66 44 - 121 IU/L   AST 21 0 - 40 IU/L   ALT 22 0 - 44 IU/L      Assessment & Plan:   Problem List Items Addressed This Visit       Cardiovascular and Mediastinum   Benign essential HTN - Primary     Follow up plan: No follow-ups on file.

## 2022-02-17 ENCOUNTER — Ambulatory Visit: Payer: Medicare Other | Admitting: Nurse Practitioner

## 2022-02-17 DIAGNOSIS — I10 Essential (primary) hypertension: Secondary | ICD-10-CM

## 2022-02-20 ENCOUNTER — Encounter: Payer: Self-pay | Admitting: Nurse Practitioner

## 2022-02-20 ENCOUNTER — Ambulatory Visit (INDEPENDENT_AMBULATORY_CARE_PROVIDER_SITE_OTHER): Payer: Medicare Other | Admitting: Nurse Practitioner

## 2022-02-20 VITALS — BP 137/80 | HR 91 | Temp 98.7°F | Wt 160.2 lb

## 2022-02-20 DIAGNOSIS — G309 Alzheimer's disease, unspecified: Secondary | ICD-10-CM

## 2022-02-20 DIAGNOSIS — I10 Essential (primary) hypertension: Secondary | ICD-10-CM | POA: Diagnosis not present

## 2022-02-20 DIAGNOSIS — F0280 Dementia in other diseases classified elsewhere without behavioral disturbance: Secondary | ICD-10-CM | POA: Diagnosis not present

## 2022-02-20 DIAGNOSIS — D518 Other vitamin B12 deficiency anemias: Secondary | ICD-10-CM

## 2022-02-20 MED ORDER — HYDROCORTISONE (PERIANAL) 2.5 % EX CREA: 1.0000 "application " | TOPICAL_CREAM | Freq: Two times a day (BID) | CUTANEOUS | 1 refills | Status: DC

## 2022-02-20 NOTE — Progress Notes (Signed)
? ?BP 137/80   Pulse 91   Temp 98.7 ?F (37.1 ?C) (Oral)   Wt 160 lb 3.2 oz (72.7 kg)   SpO2 97%   BMI 22.99 kg/m?   ? ?Subjective:  ? ? Patient ID: Ian Parker, male    DOB: 1931/08/05, 88 y.o.   MRN: [id number] ? ?HPI: ?Ian Parker is a 88 y.o. male ? ?Chief Complaint  ?Patient presents with  ? Hypertension  ? ?HYPERTENSION ?Hypertension status: controlled  ?Satisfied with current treatment? yes ?Duration of hypertension: years ?BP monitoring frequency:  not checking ?BP range:  ?BP medication side effects:  no ?Medication compliance: excellent compliance ?Previous BP meds: amlodipine and Maxide ?Aspirin: no ?Recurrent headaches: no ?Visual changes: no ?Palpitations: no ?Dyspnea: no ?Chest pain: no ?Lower extremity edema: no ?Dizzy/lightheaded: no ? ?MEMORY ?Patient and wife deny concerns at visit today.  Has been taking the Memantine and Galantamine for his memory.  States he is doing well. Denies behavior concerns.  Patient is sleeping well at night. ? ?HEMORRHOIDS ?Patient states he needs some more cream.  States it helps with his pain.   ? ?Relevant past medical, surgical, family and social history reviewed and updated as indicated. Interim medical history since our last visit reviewed. ?Allergies and medications reviewed and updated. ? ?Review of Systems  ?Eyes:  Negative for visual disturbance.  ?Respiratory:  Negative for shortness of breath.   ?Cardiovascular:  Negative for chest pain and leg swelling.  ?Neurological:  Negative for light-headedness and headaches.  ? ?Per HPI unless specifically indicated above ? ?   ?Objective:  ?  ?BP 137/80   Pulse 91   Temp 98.7 ?F (37.1 ?C) (Oral)   Wt 160 lb 3.2 oz (72.7 kg)   SpO2 97%   BMI 22.99 kg/m?   ?Wt Readings from Last 3 Encounters:  ?02/20/22 160 lb 3.2 oz (72.7 kg)  ?11/11/21 160 lb 3.2 oz (72.7 kg)  ?08/11/21 158 lb (71.7 kg)  ?  ?Physical Exam ?Vitals and nursing note reviewed.  ?Constitutional:   ?   General: He is not in acute distress. ?    Appearance: Normal appearance. He is not ill-appearing, toxic-appearing or diaphoretic.  ?HENT:  ?   Head: Normocephalic.  ?   Right Ear: External ear normal.  ?   Left Ear: External ear normal.  ?   Nose: Nose normal. No congestion or rhinorrhea.  ?   Mouth/Throat:  ?   Mouth: Mucous membranes are moist.  ?Eyes:  ?   General:     ?   Right eye: No discharge.     ?   Left eye: No discharge.  ?   Extraocular Movements: Extraocular movements intact.  ?   Conjunctiva/sclera: Conjunctivae normal.  ?   Pupils: Pupils are equal, round, and reactive to light.  ?Cardiovascular:  ?   Rate and Rhythm: Normal rate and regular rhythm.  ?   Heart sounds: No murmur heard. ?Pulmonary:  ?   Effort: Pulmonary effort is normal. No respiratory distress.  ?   Breath sounds: Normal breath sounds. No wheezing, rhonchi or rales.  ?Abdominal:  ?   General: Abdomen is flat. Bowel sounds are normal.  ?Musculoskeletal:  ?   Cervical back: Normal range of motion and neck supple.  ?Skin: ?   General: Skin is warm and dry.  ?   Capillary Refill: Capillary refill takes less than 2 seconds.  ?Neurological:  ?   General: No focal deficit present.  ?  Mental Status: He is alert and oriented to person, place, and time.  ?Psychiatric:     ?   Mood and Affect: Mood normal.     ?   Behavior: Behavior normal.     ?   Thought Content: Thought content normal.     ?   Judgment: Judgment normal.  ? ? ?Results for orders placed or performed in visit on 11/11/21  ?Comp Met (CMET)  ?Result Value Ref Range  ? Glucose 106 (H) 70 - 99 mg/dL  ? BUN 14 8 - 27 mg/dL  ? Creatinine, Ser 1.20 0.76 - 1.27 mg/dL  ? eGFR 58 (L) >59 mL/min/1.73  ? BUN/Creatinine Ratio 12 10 - 24  ? Sodium 145 (H) 134 - 144 mmol/L  ? Potassium 3.6 3.5 - 5.2 mmol/L  ? Chloride 101 96 - 106 mmol/L  ? CO2 30 (H) 20 - 29 mmol/L  ? Calcium 9.7 8.6 - 10.2 mg/dL  ? Total Protein 7.1 6.0 - 8.5 g/dL  ? Albumin 5.0 (H) 3.6 - 4.6 g/dL  ? Globulin, Total 2.1 1.5 - 4.5 g/dL  ? Albumin/Globulin Ratio 2.4  (H) 1.2 - 2.2  ? Bilirubin Total 0.5 0.0 - 1.2 mg/dL  ? Alkaline Phosphatase 66 44 - 121 IU/L  ? AST 21 0 - 40 IU/L  ? ALT 22 0 - 44 IU/L  ? ?   ?Assessment & Plan:  ? ?Problem List Items Addressed This Visit   ? ?  ? Cardiovascular and Mediastinum  ? Benign essential HTN  ?  Chronic.  Controlled.  Continue with current medication regimen on Amlodipine 81m and Maxide 75/584mdaily.  No refills needed today.  Will recheck potassium today.  Labs ordered today.  Return to clinic in 3 months for reevaluation.  Call sooner if concerns arise.  ? ? ?  ?  ? Relevant Orders  ? Basic Metabolic Panel (BMET)  ?  ? Nervous and Auditory  ? Alzheimer's dementia without behavioral disturbance ([facility]  ?  Chronic.  Controlled.  Continue with current medication regimen with Mamantine and galantamine.  Labs ordered today.  Return to clinic in 3 months for reevaluation.  Call sooner if concerns arise.  ? ? ?  ?  ? Relevant Orders  ? Basic Metabolic Panel (BMET)  ?  ? Other  ? Anemia due to vitamin B12 deficiency  ?  Labs ordered today. Will make recommendations based on lab results.  ? ?  ?  ? Relevant Orders  ? CBC w/Diff  ? B12  ?  ? ?Follow up plan: ?No follow-ups on file. ? ? ? ? ? ? ?

## 2022-02-20 NOTE — Progress Notes (Deleted)
? ?  There were no vitals taken for this visit.  ? ?Subjective:  ? ? Patient ID: Ian Parker, male    DOB: 1930-11-14, 88 y.o.   MRN: [id number] ? ?HPI: ?Ian Parker is a 88 y.o. male ? ?No chief complaint on file. ? ?HYPERTENSION ?Hypertension status: {Blank single:19197::"controlled","uncontrolled","better","worse","exacerbated","stable"}  ?Satisfied with current treatment? {Blank single:19197::"yes","no"} ?Duration of hypertension: {Blank single:19197::"chronic","months","years"} ?BP monitoring frequency:  {Blank single:19197::"not checking","rarely","daily","weekly","monthly","a few times a day","a few times a week","a few times a month"} ?BP range:  ?BP medication side effects:  {Blank single:19197::"yes","no"} ?Medication compliance: {Blank single:19197::"excellent compliance","good compliance","fair compliance","poor compliance"} ?Previous BP meds:{Blank XFQHKUVJ:50518::"ZFPO","IPPGFQMKJI","ZXYOFVWAQL/RJPVGKKDPT","ELMRAJHH","IDUPBDHDIX","BOERQSXQKS/KSHN","GITJLLVDIX (bystolic)","carvedilol","chlorthalidone","clonidine","diltiazem","exforge HCT","HCTZ","irbesartan (avapro)","labetalol","lisinopril","lisinopril-HCTZ","losartan (cozaar)","methyldopa","nifedipine","olmesartan (benicar)","olmesartan-HCTZ","quinapril","ramipril","spironalactone","tekturna","valsartan","valsartan-HCTZ","verapamil"} ?Aspirin: {Blank single:19197::"yes","no"} ?Recurrent headaches: {Blank single:19197::"yes","no"} ?Visual changes: {Blank single:19197::"yes","no"} ?Palpitations: {Blank single:19197::"yes","no"} ?Dyspnea: {Blank single:19197::"yes","no"} ?Chest pain: {Blank single:19197::"yes","no"} ?Lower extremity edema: {Blank single:19197::"yes","no"} ?Dizzy/lightheaded: {Blank single:19197::"yes","no"} ? ?Relevant past medical, surgical, family and social history reviewed and updated as indicated. Interim medical history since our last visit reviewed. ?Allergies and medications reviewed and updated. ? ?Review of Systems ? ?Per HPI  unless specifically indicated above ? ?   ?Objective:  ?  ?There were no vitals taken for this visit.  ?Wt Readings from Last 3 Encounters:  ?11/11/21 160 lb 3.2 oz (72.7 kg)  ?08/11/21 158 lb (71.7 kg)  ?07/17/21 157 lb (71.2 kg)  ?  ?Physical Exam ? ?Results for orders placed or performed in visit on 11/11/21  ?Comp Met (CMET)  ?Result Value Ref Range  ? Glucose 106 (H) 70 - 99 mg/dL  ? BUN 14 8 - 27 mg/dL  ? Creatinine, Ser 1.20 0.76 - 1.27 mg/dL  ? eGFR 58 (L) >59 mL/min/1.73  ? BUN/Creatinine Ratio 12 10 - 24  ? Sodium 145 (H) 134 - 144 mmol/L  ? Potassium 3.6 3.5 - 5.2 mmol/L  ? Chloride 101 96 - 106 mmol/L  ? CO2 30 (H) 20 - 29 mmol/L  ? Calcium 9.7 8.6 - 10.2 mg/dL  ? Total Protein 7.1 6.0 - 8.5 g/dL  ? Albumin 5.0 (H) 3.6 - 4.6 g/dL  ? Globulin, Total 2.1 1.5 - 4.5 g/dL  ? Albumin/Globulin Ratio 2.4 (H) 1.2 - 2.2  ? Bilirubin Total 0.5 0.0 - 1.2 mg/dL  ? Alkaline Phosphatase 66 44 - 121 IU/L  ? AST 21 0 - 40 IU/L  ? ALT 22 0 - 44 IU/L  ? ?   ?Assessment & Plan:  ? ?Problem List Items Addressed This Visit   ?  ? Cardiovascular and Mediastinum  ? Benign essential HTN - Primary  ?  ? Nervous and Auditory  ? Alzheimer's dementia without behavioral disturbance ([place])  ?  ? Other  ? Anemia due to vitamin B12 deficiency  ?  ? ?Follow up plan: ?No follow-ups on file. ? ? ? ? ? ?

## 2022-02-20 NOTE — Assessment & Plan Note (Signed)
Chronic.  Controlled.  Continue with current medication regimen with Mamantine and galantamine.  Labs ordered today.  Return to clinic in 3 months for reevaluation.  Call sooner if concerns arise.  ? ?

## 2022-02-20 NOTE — Assessment & Plan Note (Signed)
Chronic.  Controlled.  Continue with current medication regimen on Amlodipine 10mg and Maxide 75/50mg daily.  No refills needed today. Will recheck potassium today.  Labs ordered today.  Return to clinic in 3 months for reevaluation.  Call sooner if concerns arise.   

## 2022-02-20 NOTE — Assessment & Plan Note (Signed)
Labs ordered today.  Will make recommendations based on lab results. ?

## 2022-02-21 LAB — BASIC METABOLIC PANEL
BUN/Creatinine Ratio: 13 (ref 10–24)
BUN: 15 mg/dL (ref 8–27)
CO2: 21 mmol/L (ref 20–29)
Calcium: 9.6 mg/dL (ref 8.6–10.2)
Chloride: 102 mmol/L (ref 96–106)
Creatinine, Ser: 1.14 mg/dL (ref 0.76–1.27)
Glucose: 122 mg/dL — ABNORMAL HIGH (ref 70–99)
Potassium: 3.3 mmol/L — ABNORMAL LOW (ref 3.5–5.2)
Sodium: 148 mmol/L — ABNORMAL HIGH (ref 134–144)
eGFR: 62 mL/min/{1.73_m2} (ref >59)

## 2022-02-21 LAB — CBC WITH DIFFERENTIAL/PLATELET
Basophils Absolute: 0 10*3/uL (ref 0.0–0.2)
Basos: 1 %
EOS (ABSOLUTE): 0.1 10*3/uL (ref 0.0–0.4)
Eos: 2 %
Hematocrit: 47.8 % (ref 37.5–51.0)
Hemoglobin: 15.4 g/dL (ref 13.0–17.7)
Immature Grans (Abs): 0 10*3/uL (ref 0.0–0.1)
Immature Granulocytes: 1 %
Lymphocytes Absolute: 1.9 10*3/uL (ref 0.7–3.1)
Lymphs: 31 %
MCH: 25.8 pg — ABNORMAL LOW (ref 26.6–33.0)
MCHC: 32.2 g/dL (ref 31.5–35.7)
MCV: 80 fL (ref 79–97)
Monocytes Absolute: 0.5 10*3/uL (ref 0.1–0.9)
Monocytes: 8 %
Neutrophils Absolute: 3.4 10*3/uL (ref 1.4–7.0)
Neutrophils: 57 %
Platelets: 179 10*3/uL (ref 150–450)
RBC: 5.98 x10E6/uL — ABNORMAL HIGH (ref 4.14–5.80)
RDW: 14.5 % (ref 11.6–15.4)
WBC: 6 10*3/uL (ref 3.4–10.8)

## 2022-02-21 LAB — VITAMIN B12: Vitamin B-12: >2000 pg/mL — ABNORMAL HIGH (ref 232–1245)

## 2022-02-23 NOTE — Progress Notes (Signed)
Please let patient's wife know that his lab work shows that his potassium is low.  Can you please verify that he is taking the potassium supplement.  If he is, we should recheck this in a month to make sure it improves.  We may need to increase the dose.  Otherwise, his blood work looks good.  No other concerns at this time.

## 2022-03-25 ENCOUNTER — Other Ambulatory Visit: Payer: Medicare Other

## 2022-03-25 DIAGNOSIS — E876 Hypokalemia: Secondary | ICD-10-CM

## 2022-03-25 NOTE — Progress Notes (Unsigned)
Lab ordered.

## 2022-03-26 LAB — BASIC METABOLIC PANEL
BUN/Creatinine Ratio: 13 (ref 10–24)
BUN: 15 mg/dL (ref 8–27)
CO2: 27 mmol/L (ref 20–29)
Calcium: 10 mg/dL (ref 8.6–10.2)
Chloride: 97 mmol/L (ref 96–106)
Creatinine, Ser: 1.16 mg/dL (ref 0.76–1.27)
Glucose: 117 mg/dL — ABNORMAL HIGH (ref 70–99)
Potassium: 3.1 mmol/L — ABNORMAL LOW (ref 3.5–5.2)
Sodium: 142 mmol/L (ref 134–144)
eGFR: 61 mL/min/{1.73_m2} (ref >59)

## 2022-03-26 NOTE — Progress Notes (Signed)
Please let patient'w wife know that his potassium remains low.  We need to increase the dose of potassium he takes daily to [facility].  He can take 3 of the 20 tabs per day.  When he is out I will increase the amount.  I would like him to come back in 3 weeks and recheck this.

## 2022-04-09 ENCOUNTER — Ambulatory Visit (INDEPENDENT_AMBULATORY_CARE_PROVIDER_SITE_OTHER): Payer: Medicare Other | Admitting: *Deleted

## 2022-04-09 DIAGNOSIS — Z0000 Encounter for general adult medical examination without abnormal findings: Secondary | ICD-10-CM | POA: Diagnosis not present

## 2022-04-09 NOTE — Patient Instructions (Signed)
Mr. Ian Parker , Thank you for taking time to come for your Medicare Wellness Visit. I appreciate your ongoing commitment to your health goals. Please review the following plan we discussed and let me know if I can assist you in the future.   Screening recommendations/referrals: Colonoscopy: no longer required Recommended yearly ophthalmology/optometry visit for glaucoma screening and checkup Recommended yearly dental visit for hygiene and checkup  Vaccinations: Influenza vaccine: declined Pneumococcal vaccine: declined Tdap vaccine: Education provided Shingles vaccine: declined    Advanced directives: Education provided  Conditions/risks identified:   Next appointment: 04-16-2022 @ 9:20  Labs  05-22-2022  @ 2:00  [facility] 65 Years and Older, Male Preventive care refers to lifestyle choices and visits with your health care provider that can promote health and wellness. What does preventive care include? A yearly physical exam. This is also called an annual well check. Dental exams once or twice a year. Routine eye exams. Ask your health care provider how often you should have your eyes checked. Personal lifestyle choices, including: Daily care of your teeth and gums. Regular physical activity. Eating a healthy diet. Avoiding tobacco and drug use. Limiting alcohol use. Practicing safe sex. Taking low doses of aspirin every day. Taking vitamin and mineral supplements as recommended by your health care provider. What happens during an annual well check? The services and screenings done by your health care provider during your annual well check will depend on your age, overall health, lifestyle risk factors, and family history of disease. Counseling  Your health care provider may ask you questions about your: Alcohol use. Tobacco use. Drug use. Emotional well-being. Home and relationship well-being. Sexual activity. Eating habits. History of falls. Memory and  ability to understand (cognition). Work and work Statistician. Screening  You may have the following tests or measurements: Height, weight, and BMI. Blood pressure. Lipid and cholesterol levels. These may be checked every 5 years, or more frequently if you are over 53 years old. Skin check. Lung cancer screening. You may have this screening every year starting at age 24 if you have a 30-pack-year history of smoking and currently smoke or have quit within the past 15 years. Fecal occult blood test (FOBT) of the stool. You may have this test every year starting at age 58. Flexible sigmoidoscopy or colonoscopy. You may have a sigmoidoscopy every 5 years or a colonoscopy every 10 years starting at age 34. Prostate cancer screening. Recommendations will vary depending on your family history and other risks. Hepatitis C blood test. Hepatitis B blood test. Sexually transmitted disease (STD) testing. Diabetes screening. This is done by checking your blood sugar (glucose) after you have not eaten for a while (fasting). You may have this done every 1-3 years. Abdominal aortic aneurysm (AAA) screening. You may need this if you are a current or former smoker. Osteoporosis. You may be screened starting at age 78 if you are at high risk. Talk with your health care provider about your test results, treatment options, and if necessary, the need for more tests. Vaccines  Your health care provider may recommend certain vaccines, such as: Influenza vaccine. This is recommended every year. Tetanus, diphtheria, and acellular pertussis (Tdap, Td) vaccine. You may need a Td booster every 10 years. Zoster vaccine. You may need this after age 63. Pneumococcal 13-valent conjugate (PCV13) vaccine. One dose is recommended after age 41. Pneumococcal polysaccharide (PPSV23) vaccine. One dose is recommended after age 59. Talk to your health care provider about which screenings  and vaccines you need and how often you need  them. This information is not intended to replace advice given to you by your health care provider. Make sure you discuss any questions you have with your health care provider. Document Released: 11/15/2015 Document Revised: 07/08/2016 Document Reviewed: 08/20/2015 Elsevier Interactive Patient Education  2017 [employer] Prevention in the Home Falls can cause injuries. They can happen to people of all ages. There are many things you can do to make your home safe and to help prevent falls. What can I do on the outside of my home? Regularly fix the edges of walkways and driveways and fix any cracks. Remove anything that might make you trip as you walk through a door, such as a raised step or threshold. Trim any bushes or trees on the path to your home. Use bright outdoor lighting. Clear any walking paths of anything that might make someone trip, such as rocks or tools. Regularly check to see if handrails are loose or broken. Make sure that both sides of any steps have handrails. Any raised decks and porches should have guardrails on the edges. Have any leaves, snow, or ice cleared regularly. Use sand or salt on walking paths during winter. Clean up any spills in your garage right away. This includes oil or grease spills. What can I do in the bathroom? Use night lights. Install grab bars by the toilet and in the tub and shower. Do not use towel bars as grab bars. Use non-skid mats or decals in the tub or shower. If you need to sit down in the shower, use a plastic, non-slip stool. Keep the floor dry. Clean up any water that spills on the floor as soon as it happens. Remove soap buildup in the tub or shower regularly. Attach bath mats securely with double-sided non-slip rug tape. Do not have throw rugs and other things on the floor that can make you trip. What can I do in the bedroom? Use night lights. Make sure that you have a light by your bed that is easy to reach. Do not use  any sheets or blankets that are too big for your bed. They should not hang down onto the floor. Have a firm chair that has side arms. You can use this for support while you get dressed. Do not have throw rugs and other things on the floor that can make you trip. What can I do in the kitchen? Clean up any spills right away. Avoid walking on wet floors. Keep items that you use a lot in easy-to-reach places. If you need to reach something above you, use a strong step stool that has a grab bar. Keep electrical cords out of the way. Do not use floor polish or wax that makes floors slippery. If you must use wax, use non-skid floor wax. Do not have throw rugs and other things on the floor that can make you trip. What can I do with my stairs? Do not leave any items on the stairs. Make sure that there are handrails on both sides of the stairs and use them. Fix handrails that are broken or loose. Make sure that handrails are as long as the stairways. Check any carpeting to make sure that it is firmly attached to the stairs. Fix any carpet that is loose or worn. Avoid having throw rugs at the top or bottom of the stairs. If you do have throw rugs, attach them to the floor with carpet tape.  Make sure that you have a light switch at the top of the stairs and the bottom of the stairs. If you do not have them, ask someone to add them for you. What else can I do to help prevent falls? Wear shoes that: Do not have high heels. Have rubber bottoms. Are comfortable and fit you well. Are closed at the toe. Do not wear sandals. If you use a stepladder: Make sure that it is fully opened. Do not climb a closed stepladder. Make sure that both sides of the stepladder are locked into place. Ask someone to hold it for you, if possible. Clearly mark and make sure that you can see: Any grab bars or handrails. First and last steps. Where the edge of each step is. Use tools that help you move around (mobility aids)  if they are needed. These include: Canes. Walkers. Scooters. Crutches. Turn on the lights when you go into a dark area. Replace any light bulbs as soon as they burn out. Set up your furniture so you have a clear path. Avoid moving your furniture around. If any of your floors are uneven, fix them. If there are any pets around you, be aware of where they are. Review your medicines with your doctor. Some medicines can make you feel dizzy. This can increase your chance of falling. Ask your doctor what other things that you can do to help prevent falls. This information is not intended to replace advice given to you by your health care provider. Make sure you discuss any questions you have with your health care provider. Document Released: 08/15/2009 Document Revised: 03/26/2016 Document Reviewed: 11/23/2014 Elsevier Interactive Patient Education  2017 [employer].
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
## 2022-04-16 ENCOUNTER — Other Ambulatory Visit: Payer: Medicare Other

## 2022-04-16 DIAGNOSIS — E876 Hypokalemia: Secondary | ICD-10-CM

## 2022-04-17 ENCOUNTER — Other Ambulatory Visit: Payer: Self-pay | Admitting: Family Medicine

## 2022-04-17 DIAGNOSIS — E876 Hypokalemia: Secondary | ICD-10-CM

## 2022-04-17 LAB — COMPREHENSIVE METABOLIC PANEL
ALT: 24 IU/L (ref 0–44)
AST: 21 IU/L (ref 0–40)
Albumin/Globulin Ratio: 2.1 (ref 1.2–2.2)
Albumin: 4.6 g/dL (ref 3.6–4.6)
Alkaline Phosphatase: 63 IU/L (ref 44–121)
BUN/Creatinine Ratio: 11 (ref 10–24)
BUN: 13 mg/dL (ref 8–27)
Bilirubin Total: 0.6 mg/dL (ref 0.0–1.2)
CO2: 25 mmol/L (ref 20–29)
Calcium: 9.6 mg/dL (ref 8.6–10.2)
Chloride: 101 mmol/L (ref 96–106)
Creatinine, Ser: 1.15 mg/dL (ref 0.76–1.27)
Globulin, Total: 2.2 g/dL (ref 1.5–4.5)
Glucose: 104 mg/dL — ABNORMAL HIGH (ref 70–99)
Potassium: 3.2 mmol/L — ABNORMAL LOW (ref 3.5–5.2)
Sodium: 145 mmol/L — ABNORMAL HIGH (ref 134–144)
Total Protein: 6.8 g/dL (ref 6.0–8.5)
eGFR: 61 mL/min/{1.73_m2} (ref >59)

## 2022-04-17 MED ORDER — POTASSIUM CHLORIDE CRYS ER 20 MEQ PO TBCR: EXTENDED_RELEASE_TABLET | ORAL | 1 refills | Status: DC

## 2022-04-20 ENCOUNTER — Other Ambulatory Visit: Payer: Medicare Other

## 2022-04-27 ENCOUNTER — Other Ambulatory Visit: Payer: Medicare Other

## 2022-04-27 DIAGNOSIS — E876 Hypokalemia: Secondary | ICD-10-CM

## 2022-04-28 LAB — BASIC METABOLIC PANEL
BUN/Creatinine Ratio: 11 (ref 10–24)
BUN: 13 mg/dL (ref 8–27)
CO2: 26 mmol/L (ref 20–29)
Calcium: 9.4 mg/dL (ref 8.6–10.2)
Chloride: 101 mmol/L (ref 96–106)
Creatinine, Ser: 1.21 mg/dL (ref 0.76–1.27)
Glucose: 132 mg/dL — ABNORMAL HIGH (ref 70–99)
Potassium: 3.4 mmol/L — ABNORMAL LOW (ref 3.5–5.2)
Sodium: 144 mmol/L (ref 134–144)
eGFR: 57 mL/min/{1.73_m2} — ABNORMAL LOW (ref >59)

## 2022-04-30 ENCOUNTER — Telehealth: Payer: Self-pay

## 2022-04-30 ENCOUNTER — Other Ambulatory Visit: Payer: Self-pay | Admitting: Family Medicine

## 2022-04-30 ENCOUNTER — Telehealth: Payer: Self-pay | Admitting: Nurse Practitioner

## 2022-04-30 DIAGNOSIS — E876 Hypokalemia: Secondary | ICD-10-CM

## 2022-04-30 MED ORDER — POTASSIUM CHLORIDE CRYS ER 20 MEQ PO TBCR: 40.0000 meq | EXTENDED_RELEASE_TABLET | Freq: Two times a day (BID) | ORAL | 1 refills | Status: DC

## 2022-04-30 NOTE — Telephone Encounter (Signed)
Refilled 04/30/2022 #120 1 refill Requested Prescriptions  Pending Prescriptions Disp Refills  . KLOR-CON M20 20 MEQ tablet [Pharmacy Med Name: Klor-Con M20 20 MEQ Oral Tablet Extended Release] 180 tablet 0    Sig: Take 1 tablet by mouth twice daily     Endocrinology:  Minerals - Potassium Supplementation Failed - 04/30/2022  5:34 PM      Failed - K in normal range and within 360 days    Potassium  Date Value Ref Range Status  04/27/2022 3.4 (L) 3.5 - 5.2 mmol/L Final  05/07/2013 3.8 3.5 - 5.1 mmol/L Final         Passed - Cr in normal range and within 360 days    Creatinine  Date Value Ref Range Status  05/07/2013 1.21 0.60 - 1.30 mg/dL Final   Creatinine, Ser  Date Value Ref Range Status  04/27/2022 1.21 0.76 - 1.27 mg/dL Final         Passed - Valid encounter within last 12 months    Recent Outpatient Visits          2 months ago Alzheimer's dementia without behavioral disturbance ([place])   [facility] Jon Billings, NP   5 months ago Alzheimer's dementia without behavioral disturbance ([place])   [facility] Jon Billings, NP   8 months ago Benign essential HTN   [facility] Jon Billings, NP   11 months ago Prostate cancer [facility])   [facility] Jon Billings, NP      Future Appointments            In 3 weeks Jon Billings, NP [facility], [place]   In 2 months [place], [facility] Payment [facility]

## 2022-04-30 NOTE — Telephone Encounter (Signed)
LVM regarding question about lab work. Dr. Wynetta Emery has not reviewed them yet but will call once she finalizes her notes.

## 2022-04-30 NOTE — Telephone Encounter (Signed)
Copied from [place] [id number]. Topic: General - Other >> Apr 30, 2022 11:12 AM Chapman Fitch wrote: Reason for CRM: Pt would like call to go over lab results/ please advise

## 2022-05-06 NOTE — Telephone Encounter (Signed)
Patients daughter, Lattie Haw, calling in.  States that they are still trying to get this medication for patient since he is completely out.  She has not heard anything from the office and the pharmacy states that she needs to call PCP to get update.

## 2022-05-06 NOTE — Telephone Encounter (Signed)
[place] called and spoke to [facility], [employer]chant [employer] officer about the refill(s) Potassium Chloride requested. Advised it was sent on 04/30/22 #120/1 refill(s). She says they have it available for pickup. Patient's daughter Ian Parker called and advised, she verbalized understanding.

## 2022-05-22 ENCOUNTER — Encounter: Payer: Self-pay | Admitting: Nurse Practitioner

## 2022-05-22 ENCOUNTER — Ambulatory Visit (INDEPENDENT_AMBULATORY_CARE_PROVIDER_SITE_OTHER): Payer: Medicare Other | Admitting: Nurse Practitioner

## 2022-05-22 ENCOUNTER — Telehealth: Payer: Self-pay

## 2022-05-22 VITALS — BP 120/71 | HR 74 | Temp 97.8°F | Wt 159.6 lb

## 2022-05-22 DIAGNOSIS — I10 Essential (primary) hypertension: Secondary | ICD-10-CM

## 2022-05-22 DIAGNOSIS — E876 Hypokalemia: Secondary | ICD-10-CM

## 2022-05-22 DIAGNOSIS — D518 Other vitamin B12 deficiency anemias: Secondary | ICD-10-CM | POA: Diagnosis not present

## 2022-05-22 DIAGNOSIS — G309 Alzheimer's disease, unspecified: Secondary | ICD-10-CM | POA: Diagnosis not present

## 2022-05-22 DIAGNOSIS — R262 Difficulty in walking, not elsewhere classified: Secondary | ICD-10-CM

## 2022-05-22 DIAGNOSIS — F0280 Dementia in other diseases classified elsewhere without behavioral disturbance: Secondary | ICD-10-CM

## 2022-05-22 NOTE — Assessment & Plan Note (Addendum)
Ongoing.  Having difficulty ambulating per daughter. Will refer to home health.  Daughter states he is having difficulty with hygiene and interested in having a home health nurse/aid.  Referral placed to CCM Social Worker to help with resources.  Follow up in 3 months.

## 2022-05-22 NOTE — Telephone Encounter (Signed)
   Telephone encounter was:  Unsuccessful.  05/22/2022 Name: Ian Parker MRN: [id number] DOB: Jun 26, 1931  Unsuccessful outbound call made today to assist with:   in home care  Outreach Attempt:  1st Attempt  A HIPAA compliant voice message was left requesting a return call.  Instructed patient to call back at earliest convenience. [facility], Care Management  [phone] [address], [place], [place] [address] Phone: [phone] Email: [email or web address]

## 2022-05-22 NOTE — Progress Notes (Signed)
BP 120/71   Pulse 74   Temp 97.8 F (36.6 C) (Oral)   Wt 159 lb 9.6 oz (72.4 kg)   SpO2 98%   BMI 22.90 kg/m    Subjective:    Patient ID: Ian Parker, male    DOB: [birth year], 88 y.o.   MRN: [id number]  HPI: Ian Parker is a 88 y.o. male  Chief Complaint  Patient presents with   Hypertension   Follow-up   hygiene    Patient's daughter states patient is having difficulty keeping up with his hygiene, would like to discuss options for home health nurse for patient.    HYPERTENSION Hypertension status: controlled  Satisfied with current treatment? yes Duration of hypertension: years BP monitoring frequency:  not checking BP range:  BP medication side effects:  no Medication compliance: excellent compliance Previous BP meds: amlodipine and Maxide Aspirin: no Recurrent headaches: no Visual changes: no Palpitations: no Dyspnea: no Chest pain: no Lower extremity edema: no Dizzy/lightheaded: no  MEMORY Patient's daughter would like to discuss options for someone to come in the home and help with patient's hygiene.  Patient is having a difficult time walking recently.  Sometimes he takes a step and then will drag his leg a little bit.      Relevant past medical, surgical, family and social history reviewed and updated as indicated. Interim medical history since our last visit reviewed. Allergies and medications reviewed and updated.  Review of Systems  Eyes:  Negative for visual disturbance.  Respiratory:  Negative for shortness of breath.   Cardiovascular:  Negative for chest pain and leg swelling.  Musculoskeletal:        Difficulty ambulating  Neurological:  Negative for light-headedness and headaches.    Per HPI unless specifically indicated above     Objective:    BP 120/71   Pulse 74   Temp 97.8 F (36.6 C) (Oral)   Wt 159 lb 9.6 oz (72.4 kg)   SpO2 98%   BMI 22.90 kg/m   Wt Readings from Last 3 Encounters:  05/22/22 159 lb 9.6 oz (72.4 kg)   02/20/22 160 lb 3.2 oz (72.7 kg)  11/11/21 160 lb 3.2 oz (72.7 kg)    Physical Exam Vitals and nursing note reviewed.  Constitutional:      General: He is not in acute distress.    Appearance: Normal appearance. He is not ill-appearing, toxic-appearing or diaphoretic.  HENT:     Head: Normocephalic.     Right Ear: External ear normal.     Left Ear: External ear normal.     Nose: Nose normal. No congestion or rhinorrhea.     Mouth/Throat:     Mouth: Mucous membranes are moist.  Eyes:     General:        Right eye: No discharge.        Left eye: No discharge.     Extraocular Movements: Extraocular movements intact.     Conjunctiva/sclera: Conjunctivae normal.     Pupils: Pupils are equal, round, and reactive to light.  Cardiovascular:     Rate and Rhythm: Normal rate and regular rhythm.     Heart sounds: No murmur heard. Pulmonary:     Effort: Pulmonary effort is normal. No respiratory distress.     Breath sounds: Normal breath sounds. No wheezing, rhonchi or rales.  Abdominal:     General: Abdomen is flat. Bowel sounds are normal.  Musculoskeletal:     Cervical back: Normal range  of motion and neck supple.  Skin:    General: Skin is warm and dry.     Capillary Refill: Capillary refill takes less than 2 seconds.  Neurological:     General: No focal deficit present.     Mental Status: He is alert and oriented to person, place, and time.  Psychiatric:        Mood and Affect: Mood normal.        Behavior: Behavior normal.        Thought Content: Thought content normal.        Judgment: Judgment normal.     Results for orders placed or performed in visit on 47/34/03  Basic metabolic panel  Result Value Ref Range   Glucose 132 (H) 70 - 99 mg/dL   BUN 13 8 - 27 mg/dL   Creatinine, Ser 1.21 0.76 - 1.27 mg/dL   eGFR 57 (L) >59 mL/min/1.73   BUN/Creatinine Ratio 11 10 - 24   Sodium 144 134 - 144 mmol/L   Potassium 3.4 (L) 3.5 - 5.2 mmol/L   Chloride 101 96 - 106 mmol/L    CO2 26 20 - 29 mmol/L   Calcium 9.4 8.6 - 10.2 mg/dL      Assessment & Plan:   Problem List Items Addressed This Visit       Cardiovascular and Mediastinum   Benign essential HTN    Chronic.  Controlled.  Continue with current medication regimen on Amlodipine 5m and Maxide 75/544mdaily.  No refills needed today.  Will recheck potassium today.  Labs ordered today.  Return to clinic in 3 months for reevaluation.  Call sooner if concerns arise.          Nervous and Auditory   Alzheimer's dementia without behavioral disturbance ([facility]    Ongoing.  Having difficulty ambulating per daughter. Will refer to home health.  Daughter states he is having difficulty with hygiene and interested in having a home health nurse/aid.  Referral placed to CCM Social Worker to help with resources.  Follow up in 3 months.      Relevant Orders   Ambulatory referral to [facility]   Other   Anemia due to vitamin B12 deficiency    Labs ordered today. Will make recommendations based on lab results.       Relevant Orders   CBC w/Diff   B12   Other Visit Diagnoses     Hypokalemia       Labs ordered today.  Will make recommendations based on lab results.    Relevant Orders   Comp Met (CMET)   Difficulty walking       Referral placed for home health for PT/OT evaluation.    Relevant Orders   Ambulatory referral [facility]      Follow up plan: Return in about 3 months (around 08/22/2022) for HTN, HLD, DM2 FU.

## 2022-05-22 NOTE — Assessment & Plan Note (Signed)
Chronic.  Controlled.  Continue with current medication regimen on Amlodipine 10mg and Maxide 75/50mg daily.  No refills needed today. Will recheck potassium today.  Labs ordered today.  Return to clinic in 3 months for reevaluation.  Call sooner if concerns arise.   

## 2022-05-22 NOTE — Assessment & Plan Note (Signed)
Labs ordered today.  Will make recommendations based on lab results. ?

## 2022-05-23 LAB — COMPREHENSIVE METABOLIC PANEL
ALT: 26 IU/L (ref 0–44)
AST: 22 IU/L (ref 0–40)
Albumin/Globulin Ratio: 1.9 (ref 1.2–2.2)
Albumin: 4.6 g/dL (ref 3.7–4.7)
Alkaline Phosphatase: 63 IU/L (ref 44–121)
BUN/Creatinine Ratio: 14 (ref 10–24)
BUN: 16 mg/dL (ref 8–27)
Bilirubin Total: 0.3 mg/dL (ref 0.0–1.2)
CO2: 25 mmol/L (ref 20–29)
Calcium: 9.5 mg/dL (ref 8.6–10.2)
Chloride: 100 mmol/L (ref 96–106)
Creatinine, Ser: 1.16 mg/dL (ref 0.76–1.27)
Globulin, Total: 2.4 g/dL (ref 1.5–4.5)
Glucose: 80 mg/dL (ref 70–99)
Potassium: 3.7 mmol/L (ref 3.5–5.2)
Sodium: 141 mmol/L (ref 134–144)
Total Protein: 7 g/dL (ref 6.0–8.5)
eGFR: 60 mL/min/{1.73_m2} (ref >59)

## 2022-05-23 LAB — CBC WITH DIFFERENTIAL/PLATELET
Basophils Absolute: 0 10*3/uL (ref 0.0–0.2)
Basos: 1 %
EOS (ABSOLUTE): 0.1 10*3/uL (ref 0.0–0.4)
Eos: 2 %
Hematocrit: 47.4 % (ref 37.5–51.0)
Hemoglobin: 15.4 g/dL (ref 13.0–17.7)
Immature Grans (Abs): 0 10*3/uL (ref 0.0–0.1)
Immature Granulocytes: 0 %
Lymphocytes Absolute: 1.8 10*3/uL (ref 0.7–3.1)
Lymphs: 30 %
MCH: 26.3 pg — ABNORMAL LOW (ref 26.6–33.0)
MCHC: 32.5 g/dL (ref 31.5–35.7)
MCV: 81 fL (ref 79–97)
Monocytes Absolute: 0.6 10*3/uL (ref 0.1–0.9)
Monocytes: 9 %
Neutrophils Absolute: 3.6 10*3/uL (ref 1.4–7.0)
Neutrophils: 58 %
Platelets: 164 10*3/uL (ref 150–450)
RBC: 5.86 x10E6/uL — ABNORMAL HIGH (ref 4.14–5.80)
RDW: 15.2 % (ref 11.6–15.4)
WBC: 6.1 10*3/uL (ref 3.4–10.8)

## 2022-05-23 LAB — VITAMIN B12: Vitamin B-12: 1116 pg/mL (ref 232–1245)

## 2022-05-25 ENCOUNTER — Telehealth: Payer: Self-pay

## 2022-05-25 ENCOUNTER — Other Ambulatory Visit: Payer: Self-pay | Admitting: Nurse Practitioner

## 2022-05-25 NOTE — Progress Notes (Signed)
Please let patient know that his lab work looks good.  Potassium is within normal range.  Continue with current dose of potassium.  No concerns at this time.  Continue with all medications.  Follow up as discussed.

## 2022-05-25 NOTE — Progress Notes (Signed)
Pt wife's asked to call daughter Lattie Haw, spoke with daughter. No further questions

## 2022-05-25 NOTE — Telephone Encounter (Signed)
   Telephone encounter was:  Successful.  05/25/2022 Name: [name] MRN: [id number] DOB: 1931/06/24  Ian Parker is a 88 y.o. year old male who is a primary care patient of Jon Billings, NP . The community resource team was consulted for assistance with  in home care  Care guide performed the following interventions: Patient provided with information about care guide support team and interviewed to confirm resource needs.Patient and wife stated he doent need in home care all of thier needs are met and he can get into the bath himself. NO resources needed at this time  Follow Up Plan:  No further follow up planned at this time. The patient has been provided with needed resources.    [facility], Care Management  [phone] [address], [place],  [address] Phone: [phone] Email: [email or web address]

## 2022-05-26 NOTE — Telephone Encounter (Signed)
Requested Prescriptions  Pending Prescriptions Disp Refills  . memantine (NAMENDA) 10 MG tablet [Pharmacy Med Name: Memantine HCl 10 MG Oral Tablet] 180 tablet 1    Sig: Take 1 tablet by mouth twice daily     Neurology:  Alzheimer's Agents 2 Passed - 05/25/2022 11:19 AM      Passed - Cr in normal range and within 360 days    Creatinine  Date Value Ref Range Status  05/07/2013 1.21 0.60 - 1.30 mg/dL Final   Creatinine, Ser  Date Value Ref Range Status  05/22/2022 1.16 0.76 - 1.27 mg/dL Final         Passed - eGFR is 5 or above and within 360 days    EGFR (African American)  Date Value Ref Range Status  05/07/2013 >60  Final   GFR calc Af Amer  Date Value Ref Range Status  08/08/2019 54 (L) >60 mL/min Final   EGFR (Non-African Amer.)  Date Value Ref Range Status  05/07/2013 56 (L)  Final    Comment:    eGFR values <22m/min/1.73 m2 may be an indication of chronic kidney disease (CKD). Calculated eGFR is useful in patients with stable renal function. The eGFR calculation will not be reliable in acutely ill patients when serum creatinine is changing rapidly. It is not useful in  patients on dialysis. The eGFR calculation may not be applicable to patients at the low and high extremes of body sizes, pregnant women, and vegetarians.    GFR calc non Af Amer  Date Value Ref Range Status  08/08/2019 46 (L) >60 mL/min Final   eGFR  Date Value Ref Range Status  05/22/2022 60 >59 mL/min/1.73 Final         Passed - Valid encounter within last 6 months    Recent Outpatient Visits          4 days ago Alzheimer's dementia without behavioral disturbance ([place]   [facility]   3 months ago Alzheimer's dementia without behavioral disturbance ([place]   [facility]   6 months ago Alzheimer's dementia without behavioral disturbance ([place]   [facility]   9 months ago Benign essential  HT[facility]   [facility]   1 year ago [facility]state cancer ([facility]   [facility]      Future Appointments            In 1 month McGowan, SHunt Oris PA-C [place]  In 3 months H[facility] Billings NP [place] [facility]           . amLODipine (NOswego 10 MG tablet [Pharmacy Med Name: amLODIPine Besylate 10 MG Oral Tablet] 90 tablet 1    Sig: Take 1 tablet by mouth once daily     Cardiovascular: Calcium Channel Blockers 2 Passed - 05/25/2022 11:19 AM      Passed - Last BP in normal range    BP Readings from Last 1 Encounters:  05/22/22 120/71         Passed - Last Heart Rate in normal range    Pulse Readings from Last 1 Encounters:  05/22/22 74         Passed - Valid encounter within last 6 months    Recent Outpatient Visits          4 days ago Alzheimer's dementia without behavioral disturbance ([facility]   [facility]  3 months ago Alzheimer's dementia without behavioral disturbance ([place])   [facility] Jon Billings, NP   6 months ago Alzheimer's dementia without behavioral disturbance ([place])   [facility], NP   9 months ago Benign essential HTN   [facility] Jon Billings, NP   1 year ago Prostate cancer [facility])   [facility] Jon Billings, NP      Future Appointments            In 1 month McGowan, Hunt Oris, PA-C [place]   In 3 months Jon Billings, NP [place], [facility]           . triamterene-hydrochlorothiazide ([place]) 75-50 MG tablet [Pharmacy Med Name: Triamterene-HCTZ 75-50 MG Oral Tablet] 90 tablet 1    Sig: Take 1 tablet by mouth once daily     Cardiovascular: Diuretic Combos Passed - 05/25/2022 11:19 AM      Passed - K in normal range and within 180 days    Potassium  Date Value Ref Range Status  05/22/2022  3.7 3.5 - 5.2 mmol/L Final  05/07/2013 3.8 3.5 - 5.1 mmol/L Final         Passed - Na in normal range and within 180 days    Sodium  Date Value Ref Range Status  05/22/2022 141 134 - 144 mmol/L Final  05/07/2013 144 136 - 145 mmol/L Final         Passed - Cr in normal range and within 180 days    Creatinine  Date Value Ref Range Status  05/07/2013 1.21 0.60 - 1.30 mg/dL Final   Creatinine, Ser  Date Value Ref Range Status  05/22/2022 1.16 0.76 - 1.27 mg/dL Final         Passed - Last BP in normal range    BP Readings from Last 1 Encounters:  05/22/22 120/71         Passed - Valid encounter within last 6 months    Recent Outpatient Visits          4 days ago Alzheimer's dementia without behavioral disturbance ([place])   [facility] Jon Billings, NP   3 months ago Alzheimer's dementia without behavioral disturbance ([place])   [facility] Jon Billings, NP   6 months ago Alzheimer's dementia without behavioral disturbance ([place])   [facility] Jon Billings, NP   9 months ago Benign essential HTN   [facility] Jon Billings, NP   1 year ago Prostate cancer [facility])   [place], Karen, NP      Future Appointments            In 1 month McGowan, [employer]   In 3 months Jon Billings, NP [facility]

## 2022-05-27 ENCOUNTER — Telehealth: Payer: Self-pay

## 2022-05-27 DIAGNOSIS — R262 Difficulty in walking, not elsewhere classified: Secondary | ICD-10-CM

## 2022-05-27 NOTE — Telephone Encounter (Unsigned)
Copied from [place] [id number]. Topic: General - Other >> May 27, 2022  2:07 PM Tiffany B wrote: Caller wanted to inform patient PCP, patient had a home health eval today and patient does not qualify for home health. There is not a physical basis or issue,issues are steaming from his dementia. Family is agreement for outpatient speech therapy.

## 2022-05-27 NOTE — Telephone Encounter (Signed)
Spoke with daughter per pt's wife request, regarding referral placed. Lattie Haw (daughter) has voiced understanding,

## 2022-05-28 NOTE — Chronic Care Management (AMB) (Signed)
  Care Coordination  Note  05/28/2022 Name: STEPAN VERRETTE MRN: [id number] DOB: 03-Mar-1931  SRIMAN TALLY is a 88 y.o. year old male who is a primary care patient of Jon Billings, NP. I reached out to [name] by phone today to offer care coordination services.      Mr. Saleeby was given information about Care Coordination services today including:  The Care Coordination services include support from the care team which includes your Nurse Coordinator, Clinical Social Worker, or Pharmacist.  The Care Coordination team is here to help remove barriers to the health concerns and goals most important to you. Care Coordination services are voluntary and the patient may decline or stop services at any time by request to their care team member.   Patient agreed to services and verbal consent obtained.   Follow up plan: Telephone appointment with care coordination team member scheduled for:06/09/2022  Noreene Larsson, [place], [place] [address] Direct Dial: [phone] [email or web address]

## 2022-06-09 ENCOUNTER — Ambulatory Visit: Payer: Self-pay | Admitting: *Deleted

## 2022-06-09 ENCOUNTER — Encounter: Payer: Self-pay | Admitting: *Deleted

## 2022-06-10 NOTE — Patient Outreach (Signed)
A user error has taken place: encounter opened in error, closed for administrative reasons.

## 2022-06-10 NOTE — Patient Outreach (Addendum)
  Care Coordination   Initial Visit Note   06/10/2022 Name: RIAN BUSCHE MRN: [id number] DOB: February 04, 1931  SEKAI GITLIN is a 88 y.o. year old male who sees Jon Billings, NP for primary care. I spoke with  patient's daughter KETHAN PAPADOPOULOS by phone today  What matters to the patients health and wellness today?  Community resources to meet patient's care needs    Goals Addressed               This Visit's Progress     "I want to be sure my dad has all the available resources" (pt-stated)        Care Coordination Interventions: Collaboration with patient's daughter related to patient's care needs related to his diagnosis of Dementia Patient's daughter confirmed that patient was assessed for [facility] services, however did not meet the criteria-daughter requesting resources for an in-home aid, Adult Day Programs discussed as an option as well as participation in the activities at the [place] [facility] to increase socialization Discussed possibility of referral to a Neurologist-daughter agreeable to discussing this option with patient's provider Solution-Focused Strategies employed:  Active listening / Reflection utilized  Emotional Support Provided Consideration of in-home help encouraged : options discussed Verbalization of feelings encouraged  Caregiver stress acknowledged Resources requested to be emailed to patient's daughter [email or web address]          SDOH assessments and interventions completed:  Yes  SDOH Interventions Today    Flowsheet Row Most Recent Value  SDOH Interventions   Food Insecurity Interventions Intervention Not Indicated  Financial Strain Interventions Intervention Not Indicated  Housing Interventions Intervention Not Indicated  Stress Interventions Intervention Not Indicated  Social Connections Interventions Other (Comment)  [resources provided for Adult Day Care and [facility] Activities]  Transportation Interventions Intervention Not Indicated         Care Coordination Interventions Activated:  Yes  Care Coordination Interventions:  Yes, provided   Follow up plan: Follow up call scheduled for 06/12/22    Encounter Outcome:  Pt. Visit Completed

## 2022-06-10 NOTE — Patient Instructions (Signed)
Visit Information  Thank you for taking time to visit with me today. Please don't hesitate to contact me if I can be of assistance to you.   Following are the goals we discussed today:   Goals Addressed               This Visit's Progress     "I want to be sure my dad has all the available resources" (pt-stated)        Care Coordination Interventions: Collaboration with patient's daughter related to patient's care needs related to his diagnosis of Dementia Patient's daughter confirmed that patient was assessed for [facility] services, however did not meet the criteria-daughter requesting resources for an in-home aid, Adult Day Programs discussed as an option as well as participation in the activities at the [place] to increase socialization Discussed possibility of referral to a Neurologist-daughter agreeable to discussing this option with patient's provider Solution-Focused Strategies employed:  Active listening / Reflection utilized  Emotional Support Provided Consideration of in-home help encouraged : options discussed Verbalization of feelings encouraged  Caregiver stress acknowledged Resources requested to be emailed to patient's daughter [email or web address]          Our next appointment is by telephone on 06/12/22 at 1:30pm  Please call the care guide team at [phone] if you need to cancel or reschedule your appointment.   If you are experiencing a Mental Health or [place] or need someone to talk to, please call the Suicide and Crisis Lifeline: 988   Patient verbalizes understanding of instructions and care plan provided today and agrees to view in [place]. Active MyChart status and patient understanding of how to access instructions and care plan via MyChart confirmed with patient.     Telephone follow up appointment with care management team member scheduled for: 06/12/22  Elliot Gurney, [place] Worker  [facility] Care  Management [phone]

## 2022-06-12 ENCOUNTER — Ambulatory Visit: Payer: Self-pay | Admitting: *Deleted

## 2022-06-12 NOTE — Patient Instructions (Signed)
Visit Information  Thank you for taking time to visit with me today. Please don't hesitate to contact me if I can be of assistance to you.   Following are the goals we discussed today:   Goals Addressed               This Visit's Progress     "I want to be sure my dad has all the available resources" (pt-stated)        Care Coordination Interventions: Collaboration with patient's daughter related to patient's care needs related to his diagnosis of Dementia Requested resources  resources for in-home aid care, Adult Day Programs and activities at the [facility] to increase socialization received, she will discuss with her family regarding next steps Re-visited possibility of referral to a Neurologist-daughter agreeable to discussing this option with patient's provider Solution-Focused Strategies employed:  Active listening / Reflection utilized           Our next appointment is by telephone on 06/19/22 at 1pm  Please call the care guide team at [phone] if you need to cancel or reschedule your appointment.   If you are experiencing a Mental Health or [place] or need someone to talk to, please call the Suicide and Crisis Lifeline: 988   Patient verbalizes understanding of instructions and care plan provided today and agrees to view in [place]. Active MyChart status and patient understanding of how to access instructions and care plan via MyChart confirmed with patient.     Telephone follow up appointment with care management team member scheduled for:06/19/22  Elliot Gurney, [place] Worker  [facility] Care Management [phone]

## 2022-06-12 NOTE — Patient Outreach (Signed)
  Care Coordination   Follow Up Visit Note   06/12/2022 Name: Ian Parker MRN: [id number] DOB: Apr 24, 1931  Ian Parker is a 88 y.o. year old male who sees Jon Billings, NP for primary care. I spoke with the daughter of  Ian Parker by phone today  What matters to the patients health and wellness today?  Community resources to meet patient's care needs    Goals Addressed               This Visit's Progress     "I want to be sure my dad has all the available resources" (pt-stated)        Care Coordination Interventions: Collaboration with patient's daughter related to patient's care needs related to his diagnosis of Dementia Requested resources  resources for in-home aid care, Adult Day Programs and activities at the [facility] to increase socialization received, she will discuss with her family regarding next steps Re-visited possibility of referral to a Neurologist-daughter agreeable to discussing this option with patient's provider Solution-Focused Strategies employed:  Active listening / Reflection utilized           SDOH assessments and interventions completed:  Yes     Care Coordination Interventions Activated:  Yes  Care Coordination Interventions:  Yes, provided   Follow up plan: Follow up call scheduled for 06/19/22    Encounter Outcome:  Pt. Visit Completed

## 2022-06-15 ENCOUNTER — Ambulatory Visit: Payer: Self-pay | Admitting: *Deleted

## 2022-06-15 NOTE — Patient Instructions (Signed)
Visit Information  Thank you for taking time to visit with me today. Please don't hesitate to contact me if I can be of assistance to you.   Following are the goals we discussed today:   Goals Addressed               This Visit's Progress     "I want to be sure my dad has all the available resources" (pt-stated)        Care Coordination Interventions:   Return call from [facility], spoke with Donata Duff. Program eligibility discussed, referral for in home care completed. Program currently has a waiting list, with confirmed 11 people ahead of patient. Movement on list moves slowly, other options to continue to be explored with patient's family members.          Our next appointment is by telephone on 06/19/22 at 1pm  Please call the care guide team at [phone] if you need to cancel or reschedule your appointment.   If you are experiencing a Mental Health or [place] or need someone to talk to, please call the Suicide and Crisis Lifeline: 988   Patient verbalizes understanding of instructions and care plan provided today and agrees to view in [place]. Active MyChart status and patient understanding of how to access instructions and care plan via MyChart confirmed with patient.     Telephone follow up appointment with care management team member scheduled for: 06/19/22  Elliot Gurney, [place] Worker  [facility] Care Management [phone]

## 2022-06-15 NOTE — Patient Outreach (Signed)
  Care Coordination   Follow Up Visit Note   06/15/2022 Name: XYLAN SHEILS MRN: [id number] DOB: [birth year]  TAYSHUN GAPPA is a 88 y.o. year old male who sees Jon Billings, NP for primary care. I  spoke with [place] regarding in home care  What matters to the patients health and wellness today?  In home care needs   Goals Addressed               This Visit's Progress     "I want to be sure my dad has all the available resources" (pt-stated)        Care Coordination Interventions:   Return call from [facility], spoke with Donata Duff. Program eligibility discussed, referral for in home care completed. Program currently has a waiting list, with confirmed 11 people ahead of patient. Movement on list moves slowly, other options to continue to be explored with patient's family members.          SDOH assessments and interventions completed:  Yes     Care Coordination Interventions Activated:  Yes  Care Coordination Interventions:  Yes, provided   Follow up plan: Referral made to [facility] home care program Follow up call scheduled for 06/19/22    Encounter Outcome:  Pt. Visit Completed

## 2022-06-19 ENCOUNTER — Ambulatory Visit: Payer: Self-pay | Admitting: *Deleted

## 2022-06-19 NOTE — Patient Outreach (Signed)
  Care Coordination   Initial Visit Note   06/19/2022 Name: CYRUS RAMSBURG MRN: [id number] DOB: 12-26-1930  NAVIN DOGAN is a 88 y.o. year old male who sees Jon Billings, NP for primary care. I spoke with  the daughter of BRAYTON BAUMGARTNER by phone today  What matters to the patients health and wellness today?  Community Resources    Goals Addressed               This Visit's Progress     "I want to be sure my dad has all the available resources" (pt-stated)        Care Coordination Interventions:   Follow up phone call to patient's daughter to assess for any additional community resource needs. Per patient's daughter, she has received the community resources emailed to her and will call this Education officer, museum with any questions or additional resource needs Informed patient's daughter of referral to [place] for their case management/ in home care program Confirmed continued short term memory loss, family continues to assist with medication adherence Patient's daughter will contact this Education officer, museum with any additional community resource needs          SDOH assessments and interventions completed:  Yes     Care Coordination Interventions Activated:  Yes  Care Coordination Interventions:  Yes, provided   Follow up plan: No further intervention required.   Encounter Outcome:  Pt. Visit Completed

## 2022-06-19 NOTE — Patient Instructions (Signed)
Visit Information  Thank you for taking time to visit with me today. Please don't hesitate to contact me if I can be of assistance to you.   Following are the goals we discussed today:   Goals Addressed               This Visit's Progress     "I want to be sure my dad has all the available resources" (pt-stated)        Care Coordination Interventions:   Follow up phone call to patient's daughter to assess for any additional community resource needs. Per patient's daughter, she has received the community resources emailed to her and will call this social worker with any questions or additional resource needs Informed patient's daughter of referral to [facility] for their case management/ in home care program Confirmed continued short term memory loss, family continues to assist with medication adherence Patient's daughter will contact this social worker with any additional community resource needs          Please call the care guide team at [phone] if you need to cancel or reschedule your appointment.   If you are experiencing a Mental Health or [place] or need someone to talk to, please call the Suicide and Crisis Lifeline: 988   Patient verbalizes understanding of instructions and care plan provided today and agrees to view in [place]. Active MyChart status and patient understanding of how to access instructions and care plan via MyChart confirmed with patient.     No further follow up required: patient's daughter to call this Education officer, museum with any additional community resource needs  [place], [place] Worker  [facility] Care Management [phone]

## 2022-06-26 ENCOUNTER — Other Ambulatory Visit: Payer: Self-pay

## 2022-06-26 DIAGNOSIS — C61 Malignant neoplasm of prostate: Secondary | ICD-10-CM

## 2022-06-29 ENCOUNTER — Other Ambulatory Visit: Payer: Self-pay | Admitting: Nurse Practitioner

## 2022-06-29 NOTE — Telephone Encounter (Signed)
Medication Refill - Medication: galantamine (RAZADYNE ER) 8 MG 24 hr capsule  Requesting 90 day supply   Has the patient contacted their pharmacy? Yes.   (Agent: If no, request that the patient contact the pharmacy for the refill. If patient does not wish to contact the pharmacy document the reason why and proceed with request.) (Agent: If yes, when and what did the pharmacy advise?)  Preferred Pharmacy (with phone number or street name):  [facility] [address] [place]  [place]) [place] [address]  Phone: [phone] Fax: [phone]   Has the patient been seen for an appointment in the last year OR does the patient have an upcoming appointment? Yes.    Agent: Please be advised that RX refills may take up to 3 business days. We ask that you follow-up with your pharmacy.

## 2022-06-30 MED ORDER — GALANTAMINE HYDROBROMIDE ER 8 MG PO CP24
8.0000 mg | ORAL_CAPSULE | Freq: Every day | ORAL | 1 refills | Status: DC
Start: 2022-06-30 — End: 2022-08-24

## 2022-06-30 NOTE — Telephone Encounter (Signed)
Has patient been taking this medication? I haven't been prescribing it to him?

## 2022-06-30 NOTE — Telephone Encounter (Signed)
Requested medication (s) are due for refill today - expired Rx  Requested medication (s) are on the active medication list -yes  Future visit scheduled -yes  Last refill: 04/02/20  Notes to clinic: medication listed as historical medication/provider- sent for review   Requested Prescriptions  Pending Prescriptions Disp Refills   galantamine (RAZADYNE ER) 8 MG 24 hr capsule      Sig: Take 1 capsule (8 mg total) by mouth daily with breakfast.     Neurology:  Alzheimer's Agents - galantamine Passed - 06/29/2022  2:25 PM      Passed - Cr in normal range and within 360 days    Creatinine  Date Value Ref Range Status  05/07/2013 1.21 0.60 - 1.30 mg/dL Final   Creatinine, Ser  Date Value Ref Range Status  05/22/2022 1.16 0.76 - 1.27 mg/dL Final         Passed - ALT in normal range and within 360 days    ALT  Date Value Ref Range Status  05/22/2022 26 0 - 44 IU/L Final   SGPT (ALT)  Date Value Ref Range Status  05/05/2013 39 12 - 78 U/L Final         Passed - AST in normal range and within 360 days    AST  Date Value Ref Range Status  05/22/2022 22 0 - 40 IU/L Final   SGOT(AST)  Date Value Ref Range Status  05/05/2013 23 15 - 37 Unit/L Final         Passed - eGFR is 9 or above and within 360 days    EGFR (African American)  Date Value Ref Range Status  05/07/2013 >60  Final   GFR calc Af Amer  Date Value Ref Range Status  08/08/2019 54 (L) >60 mL/min Final   EGFR (Non-African Amer.)  Date Value Ref Range Status  05/07/2013 56 (L)  Final    Comment:    eGFR values <28m/min/1.73 m2 may be an indication of chronic kidney disease (CKD). Calculated eGFR is useful in patients with stable renal function. The eGFR calculation will not be reliable in acutely ill patients when serum creatinine is changing rapidly. It is not useful in  patients on dialysis. The eGFR calculation may not be applicable to patients at the low and high extremes of body sizes, pregnant women,  and vegetarians.    GFR calc non Af Amer  Date Value Ref Range Status  08/08/2019 46 (L) >60 mL/min Final   eGFR  Date Value Ref Range Status  05/22/2022 60 >59 mL/min/1.73 Final         Passed - Valid encounter within last 6 months    Recent Outpatient Visits           1 month ago Alzheimer's dementia without behavioral disturbance ([place]   [place]   4 months ago Alzheimer's dementia without behavioral disturbance [facility]   [facility]   7 months [facility]   [place] [facility]   10 months ago [facility]ign essential HTN   [facility]   1 year ago [facility] ([facility]       Future Appointments             In 3 weeks McGowan, SGordan [facility]  In 1 month [facility]  [facility]               Requested Prescriptions  Pending Prescriptions Disp Refills   galantamine (RAZADYNE ER) 8 MG 24 hr capsule      Sig: Take 1 capsule (8 mg total) by mouth daily with breakfast.     Neurology:  Alzheimer's Agents - galantamine Passed - 06/29/2022  2:25 PM      Passed - Cr in normal range and within 360 days    Creatinine  Date Value Ref Range Status  05/07/2013 1.21 0.60 - 1.30 mg/dL Final   Creatinine, Ser  Date Value Ref Range Status  05/22/2022 1.16 0.76 - 1.27 mg/dL Final         Passed - ALT in normal range and within 360 days    ALT  Date Value Ref Range Status  05/22/2022 26 0 - 44 IU/L Final   SGPT (ALT)  Date Value Ref Range Status  05/05/2013 39 12 - 78 U/L Final         Passed - AST in normal range and within 360 days    AST  Date Value Ref Range Status  05/22/2022 22 0 - 40 IU/L Final   SGOT(AST)  Date Value Ref Range Status  05/05/2013 23 15 - 37 Unit/L Final          Passed - eGFR is 9 or above and within 360 days    EGFR (African American)  Date Value Ref Range Status  05/07/2013 >60  Final   GFR calc Af Amer  Date Value Ref Range Status  08/08/2019 54 (L) >60 mL/min Final   EGFR (Non-African Amer.)  Date Value Ref Range Status  05/07/2013 56 (L)  Final    Comment:    eGFR values <41m/min/1.73 m2 may be an indication of chronic kidney disease (CKD). Calculated eGFR is useful in patients with stable renal function. The eGFR calculation will not be reliable in acutely ill patients when serum creatinine is changing rapidly. It is not useful in  patients on dialysis. The eGFR calculation may not be applicable to patients at the low and high extremes of body sizes, pregnant women, and vegetarians.    GFR calc non Af Amer  Date Value Ref Range Status  08/08/2019 46 (L) >60 mL/min Final   eGFR  Date Value Ref Range Status  05/22/2022 60 >59 mL/min/1.73 Final         Passed - Valid encounter within last 6 months    Recent Outpatient Visits           1 month ago Alzheimer's dementia without behavioral disturbance ([place]   [place]   4 months ago Alzheimer's dementia without behavioral disturbance ([place]   [facility]   7 months ago [facility]'s dementia [facility] ([facility]   CD[place] NP   10 months ago Benign essential HTN   [facility]   1 year ago [facility]       Future Appointments             In 3 weeks McGowan, SGordan PaymentB[place]  In 1 month [name] [place] [place]

## 2022-06-30 NOTE — Telephone Encounter (Signed)
Spoke with Lsa (daughter) , states she has picked up the last refill on file. Daughter req 90 day supply

## 2022-07-02 ENCOUNTER — Telehealth: Payer: Self-pay

## 2022-07-02 NOTE — Telephone Encounter (Signed)
Med refill request from [place] for Hydrocortisone rectal cream 2.5%. Last fill 02/20/2022 30grams. 1 RF. Please advise.

## 2022-07-03 MED ORDER — HYDROCORTISONE (PERIANAL) 2.5 % EX CREA
1.0000 | TOPICAL_CREAM | Freq: Two times a day (BID) | CUTANEOUS | 1 refills | Status: DC
Start: 2022-07-03 — End: 2023-09-02

## 2022-07-03 NOTE — Telephone Encounter (Signed)
Refill sent to the pharmacy 

## 2022-07-03 NOTE — Addendum Note (Signed)
Addended by: Jon Billings on: 07/03/2022 08:22 AM   Modules accepted: Orders

## 2022-07-14 ENCOUNTER — Other Ambulatory Visit: Payer: Medicare Other

## 2022-07-14 DIAGNOSIS — C61 Malignant neoplasm of prostate: Secondary | ICD-10-CM

## 2022-07-15 LAB — PSA: Prostate Specific Ag, Serum: 0.4 ng/mL (ref 0.0–4.0)

## 2022-07-21 ENCOUNTER — Other Ambulatory Visit: Payer: Self-pay | Admitting: Family Medicine

## 2022-07-22 NOTE — Telephone Encounter (Signed)
Requested Prescriptions  Pending Prescriptions Disp Refills  . KLOR-CON M20 20 MEQ tablet [Pharmacy Med Name: Klor-Con M20 20 MEQ Oral Tablet Extended Release] 120 tablet 0    Sig: Take 2 tablets by mouth twice daily     Endocrinology:  Minerals - Potassium Supplementation Passed - 07/21/2022 11:43 AM      Passed - K in normal range and within 360 days    Potassium  Date Value Ref Range Status  05/22/2022 3.7 3.5 - 5.2 mmol/L Final  05/07/2013 3.8 3.5 - 5.1 mmol/L Final         Passed - Cr in normal range and within 360 days    Creatinine  Date Value Ref Range Status  05/07/2013 1.21 0.60 - 1.30 mg/dL Final   Creatinine, Ser  Date Value Ref Range Status  05/22/2022 1.16 0.76 - 1.27 mg/dL Final         Passed - Valid encounter within last 12 months    Recent Outpatient Visits          2 months ago Alzheimer's dementia without behavioral disturbance ([place])   [facility] Jon Billings, NP   5 months ago Alzheimer's dementia without behavioral disturbance ([place])   [facility] Jon Billings, NP   8 months ago Alzheimer's dementia without behavioral disturbance [facility])   [facility] Jon Billings, NP   11 months ago Benign essential HTN   [facility] Jon Billings, NP   1 year ago Prostate cancer [facility])   [place], Karen, NP      Future Appointments            Tomorrow McGowan, Hunt Oris, PA-C [place]   In 1 month Jon Billings, NP [facility], [place]

## 2022-07-22 NOTE — Progress Notes (Unsigned)
07/23/22 4:14 PM   Ian Parker 1931/03/13 [id number]  Referring provider:  Jon Billings, NP [address] [place],  [place] [address]  Urological history  1. Prostate cancer -PSA (07/2022) 0.4  -s/p cryoablation of his prostate cancer in 2007- iPSA 5.3 ng/mL   2. LU TS - cysto NED 07/2020 -I PSS 6/4   3. ED - deferred treatments to PCP    HPI: Ian Parker is a 88 y.o.male with history of prostate cancer, LUTS, and erectile dysfunction, who presents today for follow-up with his wife,  [name] and son, [name].    He wears pull-ups daily for protection.  His son states that since he does have issues with memory, he is not always aware of the need to urinate.  Patient denies any modifying or aggravating factors.  Patient denies any gross hematuria, dysuria or suprapubic/flank pain.  Patient denies any fevers, chills, nausea or vomiting.     IPSS     Row Name 07/23/22 1600         International Prostate Symptom Score   How often have you had the sensation of not emptying your bladder? Less than 1 in 5     How often have you had to urinate less than every two hours? Less than 1 in 5 times     How often have you found you stopped and started again several times when you urinated? Less than half the time     How often have you found it difficult to postpone urination? Less than 1 in 5 times     How often have you had a weak urinary stream? Not at All     How often have you had to strain to start urination? Not at All     How many times did you typically get up at night to urinate? 1 Time     Total IPSS Score 6       Quality of Life due to urinary symptoms   If you were to spend the rest of your life with your urinary condition just the way it is now how would you feel about that? Mostly Disatisfied               Score:  1-7 Mild 8-19 Moderate 20-35 Severe   PMH: Past Medical History:  Diagnosis Date   Abnormal EKG    Abnormal PSA    BPH (benign prostatic  hyperplasia)    Dysuria    Erectile dysfunction    Gastric ulcer    Goiter    Hemorrhoid    Inflammatory disease of prostate    Irritation of perirectal skin    Prostate cancer (HCC)    Rectal burning    Skin lesion     Surgical History: Past Surgical History:  Procedure Laterality Date   cryoablation of prostate  02/23/2006   ESOPHAGOGASTRODUODENOSCOPY N/A 03/14/2015   Procedure: ESOPHAGOGASTRODUODENOSCOPY (EGD);  Surgeon: Hulen Luster, MD;  Location: [facility] ENDOSCOPY;  Service: Gastroenterology;  Laterality: N/A;   THYROIDECTOMY     Subtotal    Home Medications:  Allergies as of 07/23/2022       Reactions   Ace Inhibitors    Peanuts [peanut Oil]    Penicillins    Sulfa Antibiotics         Medication List        Accurate as of July 23, 2022  4:14 PM. If you have any questions, ask your nurse or doctor.  amLODipine 10 MG tablet Commonly known as: NORVASC Take 1 tablet by mouth once daily   cholecalciferol 1000 units tablet Commonly known as: VITAMIN D Take 1,000 Units by mouth daily. Two tablets once daily   cyanocobalamin 1000 MCG tablet Commonly known as: VITAMIN B12 Take 1,000 mcg by mouth daily.   galantamine 8 MG 24 hr capsule Commonly known as: RAZADYNE ER Take 1 capsule (8 mg total) by mouth daily with breakfast.   hydrocortisone 2.5 % rectal cream Commonly known as: Anusol-HC Place 1 Application rectally 2 (two) times daily.   Klor-Con M20 20 MEQ tablet Generic drug: potassium chloride SA Take 2 tablets by mouth twice daily   memantine 10 MG tablet Commonly known as: NAMENDA Take 1 tablet by mouth twice daily   nystatin cream Commonly known as: MYCOSTATIN Apply 1 Application topically 2 (two) times daily.   triamterene-hydrochlorothiazide 75-50 MG tablet Commonly known as: MAXZIDE Take 1 tablet by mouth once daily        Allergies:  Allergies  Allergen Reactions   Ace Inhibitors    Peanuts [Peanut Oil]     Penicillins    Sulfa Antibiotics     Family History: Family History  Problem Relation Age of Onset   Heart attack Mother    Brain cancer Father        tumor unknown if cancer   Diabetes Sister    Hypertension Sister    Hyperlipidemia Sister    Heart attack Sister    Prostate cancer Brother    Kidney disease Neg Hx    Kidney cancer Neg Hx    Bladder Cancer Neg Hx     Social History:  reports that he has never smoked. He has never used smokeless tobacco. He reports that he does not drink alcohol and does not use drugs.   Physical Exam: BP 125/76   Pulse 94   Ht '5\' 11"'$  (1.803 m)   Wt 160 lb (72.6 kg)   BMI 22.32 kg/m   Constitutional:  Well nourished. Alert and oriented, No acute distress. HEENT: [place] AT, moist mucus membranes.  Trachea midline Cardiovascular: No clubbing, cyanosis, or edema. Respiratory: Normal respiratory effort, no increased work of breathing. Neurologic: Grossly intact, no focal deficits, moving all 4 extremities. Psychiatric: Normal mood and affect.   Laboratory Data: Component     Latest Ref Rng 07/14/2022  Prostate Specific Ag, Serum     0.0 - 4.0 ng/mL 0.4       Latest Ref Rng & Units 05/22/2022    2:14 PM 02/20/2022    3:03 PM 08/08/2019    4:35 AM  CBC  WBC 3.4 - 10.8 x10E3/uL 6.1  6.0  5.2   Hemoglobin 13.0 - 17.7 g/dL 15.4  15.4  16.1   Hematocrit 37.5 - 51.0 % 47.4  47.8  50.1   Platelets 150 - 450 x10E3/uL 164  179  179        Latest Ref Rng & Units 05/22/2022    2:14 PM 04/27/2022   11:10 AM 04/16/2022    9:42 AM  CMP  Glucose 70 - 99 mg/dL 80  132  104   BUN 8 - 27 mg/dL '16  13  13   '$ Creatinine 0.76 - 1.27 mg/dL 1.16  1.21  1.15   Sodium 134 - 144 mmol/L 141  144  145   Potassium 3.5 - 5.2 mmol/L 3.7  3.4  3.2   Chloride 96 - 106 mmol/L 100  101  101  CO2 20 - 29 mmol/L '25  26  25   '$ Calcium 8.6 - 10.2 mg/dL 9.5  9.4  9.6   Total Protein 6.0 - 8.5 g/dL 7.0   6.8   Total Bilirubin 0.0 - 1.2 mg/dL 0.3   0.6   Alkaline Phos 44 -  121 IU/L 63   63   AST 0 - 40 IU/L 22   21   ALT 0 - 44 IU/L 26   24   I have reviewed the labs.    Pertinent Imaging: N/A  Assessment & Plan:    Prostate cancer  - s/p cryoablation of prostate cancer in 2007 - PSA 0.4  - PSA;Future 12 months   LUTS  - IPSS 6/4 -Son states he continues to have issues with memory and therefore the 4 is not completely accurate -Refill the nystatin cream for his irritation from the depends   No follow-ups on file.  Genieve Ramaswamy, [place] [address], [place], [place] [address] [phone]

## 2022-07-23 ENCOUNTER — Ambulatory Visit (INDEPENDENT_AMBULATORY_CARE_PROVIDER_SITE_OTHER): Payer: Medicare Other | Admitting: Urology

## 2022-07-23 ENCOUNTER — Encounter: Payer: Self-pay | Admitting: Urology

## 2022-07-23 VITALS — BP 125/76 | HR 94 | Ht 71.0 in | Wt 160.0 lb

## 2022-07-23 DIAGNOSIS — Z8546 Personal history of malignant neoplasm of prostate: Secondary | ICD-10-CM

## 2022-07-23 DIAGNOSIS — C61 Malignant neoplasm of prostate: Secondary | ICD-10-CM

## 2022-07-23 DIAGNOSIS — R399 Unspecified symptoms and signs involving the genitourinary system: Secondary | ICD-10-CM

## 2022-07-23 LAB — BLADDER SCAN AMB NON-IMAGING

## 2022-07-23 MED ORDER — NYSTATIN 100000 UNIT/GM EX CREA: 1.0000 | TOPICAL_CREAM | Freq: Two times a day (BID) | CUTANEOUS | 0 refills | Status: DC

## 2022-08-24 ENCOUNTER — Ambulatory Visit (INDEPENDENT_AMBULATORY_CARE_PROVIDER_SITE_OTHER): Payer: Medicare Other | Admitting: Nurse Practitioner

## 2022-08-24 ENCOUNTER — Encounter: Payer: Self-pay | Admitting: Nurse Practitioner

## 2022-08-24 VITALS — BP 131/66 | HR 86 | Temp 97.6°F | Wt 160.6 lb

## 2022-08-24 DIAGNOSIS — C61 Malignant neoplasm of prostate: Secondary | ICD-10-CM

## 2022-08-24 DIAGNOSIS — N401 Enlarged prostate with lower urinary tract symptoms: Secondary | ICD-10-CM

## 2022-08-24 DIAGNOSIS — F0280 Dementia in other diseases classified elsewhere without behavioral disturbance: Secondary | ICD-10-CM

## 2022-08-24 DIAGNOSIS — G309 Alzheimer's disease, unspecified: Secondary | ICD-10-CM | POA: Diagnosis not present

## 2022-08-24 DIAGNOSIS — Z23 Encounter for immunization: Secondary | ICD-10-CM

## 2022-08-24 DIAGNOSIS — G6289 Other specified polyneuropathies: Secondary | ICD-10-CM | POA: Diagnosis not present

## 2022-08-24 DIAGNOSIS — Z136 Encounter for screening for cardiovascular disorders: Secondary | ICD-10-CM

## 2022-08-24 DIAGNOSIS — Z0000 Encounter for general adult medical examination without abnormal findings: Secondary | ICD-10-CM

## 2022-08-24 DIAGNOSIS — I10 Essential (primary) hypertension: Secondary | ICD-10-CM

## 2022-08-24 MED ORDER — AMLODIPINE BESYLATE 10 MG PO TABS: 10.0000 mg | ORAL_TABLET | Freq: Every day | ORAL | 1 refills | Status: DC

## 2022-08-24 MED ORDER — GALANTAMINE HYDROBROMIDE ER 8 MG PO CP24: 8.0000 mg | ORAL_CAPSULE | Freq: Every day | ORAL | 1 refills | Status: DC

## 2022-08-24 MED ORDER — TRIAMTERENE-HCTZ 75-50 MG PO TABS: 1.0000 | ORAL_TABLET | Freq: Every day | ORAL | 1 refills | Status: DC

## 2022-08-24 MED ORDER — MEMANTINE HCL 10 MG PO TABS: 10.0000 mg | ORAL_TABLET | Freq: Two times a day (BID) | ORAL | 1 refills | Status: DC

## 2022-08-24 NOTE — Assessment & Plan Note (Signed)
Chronic. Followed by Urology. Continue to follow their recommendations.

## 2022-08-24 NOTE — Progress Notes (Signed)
BP 131/66   Pulse 86   Temp 97.6 F (36.4 C) (Oral)   Wt 160 lb 9.6 oz (72.8 kg)   SpO2 98%   BMI 22.40 kg/m    Subjective:    Patient ID: Ian Parker, male    DOB: [birth year], 88 y.o.   MRN: [id number]  HPI: Ian Parker is a 88 y.o. male presenting on 08/24/2022 for comprehensive medical examination. Current medical complaints include:none  HYPERTENSION Hypertension status: controlled  Satisfied with current treatment? yes Duration of hypertension: years BP monitoring frequency:  not checking BP range:  BP medication side effects:  no Medication compliance: excellent compliance Previous BP meds: amlodipine and Maxide Aspirin: no Recurrent headaches: no Visual changes: no Palpitations: no Dyspnea: no Chest pain: no Lower extremity edema: no Dizzy/lightheaded: no   MEMORY Patient's son is present with patient during visit today.  States he is doing well.  Plays checkers regularly.  Does have some instances when his memory is not good.  However, he feels like his dad is doing well.   He currently lives with: Interim Problems from his last visit: no  Depression Screen done today and results listed below:     06/09/2022    3:19 PM 04/09/2022    9:15 AM 02/20/2022    2:47 PM 11/11/2021   10:04 AM 05/09/2021    2:01 PM  Depression screen PHQ 2/9  Decreased Interest 0 0 0 0 0  Down, Depressed, Hopeless 1 1 0 0 0  PHQ - 2 Score 1 1 0 0 0  Altered sleeping  0 0 0   Tired, decreased energy   0 0   Change in appetite  0 0 0   Feeling bad or failure about yourself   0 0 0   Trouble concentrating  0 0 0   Moving slowly or fidgety/restless  0 0 0   Suicidal thoughts  0 0 0   PHQ-9 Score  1 0 0   Difficult doing work/chores  Not difficult at all Not difficult at all Not difficult at all     The patient does not have a history of falls. I did complete a risk assessment for falls. A plan of care for falls was documented.   Past Medical History:  Past Medical History:   Diagnosis Date   Abnormal EKG    Abnormal PSA    BPH (benign prostatic hyperplasia)    Dysuria    Erectile dysfunction    Gastric ulcer    Goiter    Hemorrhoid    Inflammatory disease of prostate    Irritation of perirectal skin    Prostate cancer (HCC)    Rectal burning    Skin lesion     Surgical History:  Past Surgical History:  Procedure Laterality Date   cryoablation of prostate  02/23/2006   ESOPHAGOGASTRODUODENOSCOPY N/A 03/14/2015   Procedure: ESOPHAGOGASTRODUODENOSCOPY (EGD);  Surgeon: Hulen Luster, MD;  Location: [facility] ENDOSCOPY;  Service: Gastroenterology;  Laterality: N/A;   THYROIDECTOMY     Subtotal    Medications:  Current Outpatient Medications on File Prior to Visit  Medication Sig   cholecalciferol (VITAMIN D) 1000 UNITS tablet Take 1,000 Units by mouth daily. Two tablets once daily   hydrocortisone (ANUSOL-HC) 2.5 % rectal cream Place 1 Application rectally 2 (two) times daily.   KLOR-CON M20 20 MEQ tablet Take 2 tablets by mouth twice daily   nystatin cream (MYCOSTATIN) Apply 1 Application topically 2 (two) times  daily.   vitamin B-12 (CYANOCOBALAMIN) 1000 MCG tablet Take 1,000 mcg by mouth daily.   No current facility-administered medications on file prior to visit.    Allergies:  Allergies  Allergen Reactions   Ace Inhibitors    Peanuts [Peanut Oil]    Penicillins    Sulfa Antibiotics     Social History:  Social History   Socioeconomic History   Marital status: Married    Spouse name: Not on file   Number of children: Not on file   Years of education: Not on file   Highest education level: Not on file  Occupational History   Not on file  Tobacco Use   Smoking status: Never   Smokeless tobacco: Never  Vaping Use   Vaping Use: Never used  Substance and Sexual Activity   Alcohol use: No    Alcohol/week: 0.0 standard drinks of alcohol   Drug use: No   Sexual activity: Not Currently  Other Topics Concern   Not on file  Social History  Narrative   Not on file   Social Determinants of Health   Financial Resource Strain: Low Risk  (06/09/2022)   Overall Financial Resource Strain (CARDIA)    Difficulty of Paying Living Expenses: Not hard at all  Food Insecurity: No Food Insecurity (06/09/2022)   Hunger Vital Sign    Worried About Running Out of Food in the Last Year: Never true    [place] in the Last Year: Never true  Transportation Needs: No Transportation Needs (06/09/2022)   PRAPARE - Hydrologist (Medical): No    Lack of Transportation (Non-Medical): No  Physical Activity: Inactive (04/09/2022)   Exercise Vital Sign    Days of Exercise per Week: 0 days    Minutes of Exercise per Session: 0 min  Stress: No Stress Concern Present (04/09/2022)   [place]    Feeling of Stress : Not at all  Social Connections: Moderately Integrated (06/09/2022)   Social Connection and Isolation Panel [NHANES]    Frequency of Communication with Friends and Family: Twice a week    Frequency of Social Gatherings with Friends and Family: Once a week    Attends Religious Services: 1 to 4 times per year    Active Member of [employer] or Organizations: No    Attends Archivist Meetings: Never    Marital Status: Married  Human resources officer Violence: Not At Risk (04/09/2022)   Humiliation, Afraid, Rape, and Kick questionnaire    Fear of Current or Ex-Partner: No    Emotionally Abused: No    Physically Abused: No    Sexually Abused: No   Social History   Tobacco Use  Smoking Status Never  Smokeless Tobacco Never   Social History   Substance and Sexual Activity  Alcohol Use No   Alcohol/week: 0.0 standard drinks of alcohol    Family History:  Family History  Problem Relation Age of Onset   Heart attack Mother    Brain cancer Father        tumor unknown if cancer   Diabetes Sister    Hypertension Sister    Hyperlipidemia Sister     Heart attack Sister    Prostate cancer Brother    Kidney disease Neg Hx    Kidney cancer Neg Hx    Bladder Cancer Neg Hx     Past medical history, surgical history, medications, allergies, family history and  social history reviewed with patient today and changes made to appropriate areas of the chart.   Review of Systems  Eyes:  Negative for blurred vision and double vision.  Respiratory:  Negative for shortness of breath.   Cardiovascular:  Negative for chest pain, palpitations and leg swelling.  Neurological:  Negative for dizziness and headaches.   All other ROS negative except what is listed above and in the HPI.      Objective:    BP 131/66   Pulse 86   Temp 97.6 F (36.4 C) (Oral)   Wt 160 lb 9.6 oz (72.8 kg)   SpO2 98%   BMI 22.40 kg/m   Wt Readings from Last 3 Encounters:  08/24/22 160 lb 9.6 oz (72.8 kg)  07/23/22 160 lb (72.6 kg)  05/22/22 159 lb 9.6 oz (72.4 kg)    Physical Exam Vitals and nursing note reviewed.  Constitutional:      General: He is not in acute distress.    Appearance: Normal appearance. He is normal weight. He is not ill-appearing, toxic-appearing or diaphoretic.  HENT:     Head: Normocephalic.     Right Ear: Tympanic membrane, ear canal and external ear normal.     Left Ear: Tympanic membrane, ear canal and external ear normal.     Nose: Nose normal. No congestion or rhinorrhea.     Mouth/Throat:     Mouth: Mucous membranes are moist.  Eyes:     General:        Right eye: No discharge.        Left eye: No discharge.     Extraocular Movements: Extraocular movements intact.     Conjunctiva/sclera: Conjunctivae normal.     Pupils: Pupils are equal, round, and reactive to light.  Cardiovascular:     Rate and Rhythm: Normal rate and regular rhythm.     Heart sounds: No murmur heard. Pulmonary:     Effort: Pulmonary effort is normal. No respiratory distress.     Breath sounds: Normal breath sounds. No wheezing, rhonchi or rales.   Abdominal:     General: Abdomen is flat. Bowel sounds are normal. There is no distension.     Palpations: Abdomen is soft.     Tenderness: There is no abdominal tenderness. There is no guarding.  Musculoskeletal:     Cervical back: Normal range of motion and neck supple.  Skin:    General: Skin is warm and dry.     Capillary Refill: Capillary refill takes less than 2 seconds.  Neurological:     General: No focal deficit present.     Mental Status: He is alert and oriented to person, place, and time.     Cranial Nerves: No cranial nerve deficit.     Motor: No weakness.     Deep Tendon Reflexes: Reflexes normal.  Psychiatric:        Mood and Affect: Mood normal.        Behavior: Behavior normal.        Thought Content: Thought content normal.        Judgment: Judgment normal.     Results for orders placed or performed in visit on 07/23/22  Bladder Scan (Post Void Residual) in office  Result Value Ref Range   Scan Result 39m       Assessment & Plan:   Problem List Items Addressed This Visit       Cardiovascular and Mediastinum   Benign essential HTN    Chronic.  Controlled.  Continue with current medication regimen on Amlodipine 76m and Maxide 75/572mdaily.  Refills sent today.  Will recheck potassium today.  Labs ordered today.  Return to clinic in 3 months for reevaluation.  Call sooner if concerns arise.        Relevant Medications   amLODipine (NORVASC) 10 MG tablet   triamterene-hydrochlorothiazide (MAXZIDE) 75-50 MG tablet     Nervous and Auditory   Peripheral neuropathy (HCC)    Chronic.  Controlled.  Continue with current medication regimen of B12 100092maily.  Labs ordered today.  Return to clinic in 3 months for reevaluation.  Call sooner if concerns arise.        Relevant Medications   galantamine (RAZADYNE ER) 8 MG 24 hr capsule   memantine (NAMENDA) 10 MG tablet   Other Relevant Orders   CBC w/Diff   Comp Met (CMET)   B12   Alzheimer's dementia  without behavioral disturbance (HCC)    Chronic. Ongoing.  Son feels like patient is doing well.  Denies concerns at visit today.  Follow up in 3 months.  Call sooner if concerns arise.      Relevant Medications   galantamine (RAZADYNE ER) 8 MG 24 hr capsule   memantine (NAMENDA) 10 MG tablet   Other Relevant Orders   Comp Met (CMET)     Genitourinary   Prostate cancer (HCC)    Chronic. Followed by Urology. Continue to follow their recommendations.      Relevant Orders   CBC w/Diff   Comp Met (CMET)   Benign prostatic hyperplasia with lower urinary tract symptoms    Chronic. Followed by Urology. Continue to follow their recommendations.      Relevant Orders   Comp Met (CMET)   Other Visit Diagnoses     Annual physical exam    -  Primary   Health maintenance reviewed during visit today.  Labs ordered. Penumonia shot given. Declined flu shot.   Need for pneumococcal 20-valent conjugate vaccination       Relevant Orders   Pneumococcal conjugate vaccine 20-valent (Prevnar 20)   Need for influenza vaccination       Screening for ischemic heart disease       Relevant Orders   Lipid Profile        Discussed aspirin prophylaxis for myocardial infarction prevention and decision was it was not indicated  LABORATORY TESTING:  Health maintenance labs ordered today as discussed above.   The natural history of prostate cancer and ongoing controversy regarding screening and potential treatment outcomes of prostate cancer has been discussed with the patient. The meaning of a false positive PSA and a false negative PSA has been discussed. He indicates understanding of the limitations of this screening test and wishes to proceed with screening PSA testing.   IMMUNIZATIONS:   - Tdap: Tetanus vaccination status reviewed: Medicare. - Influenza: Refused - Pneumovax: Not applicable - Prevnar: Up to date - COVID: Not applicable - HPV: Not applicable - Shingrix vaccine: Not  applicable  SCREENING: - Colonoscopy: Not applicable  Discussed with patient purpose of the colonoscopy is to detect colon cancer at curable precancerous or early stages   - AAA Screening: Not applicable  -Hearing Test: Not applicable  -Spirometry: Not applicable   PATIENT COUNSELING:    Sexuality: Discussed sexually transmitted diseases, partner selection, use of condoms, avoidance of unintended pregnancy  and contraceptive alternatives.   Advised to avoid cigarette smoking.  I discussed with the patient that most  people either abstain from alcohol or drink within safe limits (<=14/week and <=4 drinks/occasion for males, <=7/weeks and <= 3 drinks/occasion for females) and that the risk for alcohol disorders and other health effects rises proportionally with the number of drinks per week and how often a drinker exceeds daily limits.  Discussed cessation/primary prevention of drug use and availability of treatment for abuse.   Diet: Encouraged to adjust caloric intake to maintain  or achieve ideal body weight, to reduce intake of dietary saturated fat and total fat, to limit sodium intake by avoiding high sodium foods and not adding table salt, and to maintain adequate dietary potassium and calcium preferably from fresh fruits, vegetables, and low-fat dairy products.    stressed the importance of regular exercise  Injury prevention: Discussed safety belts, safety helmets, smoke detector, smoking near bedding or upholstery.   Dental health: Discussed importance of regular tooth brushing, flossing, and dental visits.   Follow up plan: NEXT PREVENTATIVE PHYSICAL DUE IN 1 YEAR. Return in about 3 months (around 11/24/2022) for HTN, HLD, DM2 FU.

## 2022-08-24 NOTE — Assessment & Plan Note (Signed)
Chronic. Ongoing.  Son feels like patient is doing well.  Denies concerns at visit today.  Follow up in 3 months.  Call sooner if concerns arise. 

## 2022-08-24 NOTE — Assessment & Plan Note (Signed)
Chronic.  Controlled.  Continue with current medication regimen on Amlodipine '10mg'$  and Maxide 75/'50mg'$  daily.  Refills sent today.  Will recheck potassium today.  Labs ordered today.  Return to clinic in 3 months for reevaluation.  Call sooner if concerns arise.

## 2022-08-24 NOTE — Assessment & Plan Note (Addendum)
Chronic.  Controlled.  Continue with current medication regimen of B12 '1000mg'$  daily.  Labs ordered today.  Return to clinic in 3 months for reevaluation.  Call sooner if concerns arise.

## 2022-08-26 LAB — COMPREHENSIVE METABOLIC PANEL
ALT: 24 IU/L (ref 0–44)
AST: 21 IU/L (ref 0–40)
Albumin/Globulin Ratio: 2.3 — ABNORMAL HIGH (ref 1.2–2.2)
Albumin: 4.9 g/dL — ABNORMAL HIGH (ref 3.7–4.7)
Alkaline Phosphatase: 68 IU/L (ref 44–121)
BUN/Creatinine Ratio: 11 (ref 10–24)
BUN: 13 mg/dL (ref 8–27)
Bilirubin Total: 0.3 mg/dL (ref 0.0–1.2)
CO2: 25 mmol/L (ref 20–29)
Calcium: 9.6 mg/dL (ref 8.6–10.2)
Chloride: 101 mmol/L (ref 96–106)
Creatinine, Ser: 1.19 mg/dL (ref 0.76–1.27)
Globulin, Total: 2.1 g/dL (ref 1.5–4.5)
Glucose: 84 mg/dL (ref 70–99)
Potassium: 4.1 mmol/L (ref 3.5–5.2)
Sodium: 142 mmol/L (ref 134–144)
Total Protein: 7 g/dL (ref 6.0–8.5)
eGFR: 58 mL/min/{1.73_m2} — ABNORMAL LOW (ref >59)

## 2022-08-26 LAB — CBC WITH DIFFERENTIAL/PLATELET
Basophils Absolute: 0 10*3/uL (ref 0.0–0.2)
Basos: 1 %
EOS (ABSOLUTE): 0.1 10*3/uL (ref 0.0–0.4)
Eos: 2 %
Hematocrit: 50.3 % (ref 37.5–51.0)
Hemoglobin: 16.1 g/dL (ref 13.0–17.7)
Immature Grans (Abs): 0 10*3/uL (ref 0.0–0.1)
Immature Granulocytes: 0 %
Lymphocytes Absolute: 1.8 10*3/uL (ref 0.7–3.1)
Lymphs: 30 %
MCH: 25.8 pg — ABNORMAL LOW (ref 26.6–33.0)
MCHC: 32 g/dL (ref 31.5–35.7)
MCV: 81 fL (ref 79–97)
Monocytes Absolute: 0.5 10*3/uL (ref 0.1–0.9)
Monocytes: 9 %
Neutrophils Absolute: 3.6 10*3/uL (ref 1.4–7.0)
Neutrophils: 58 %
Platelets: 188 10*3/uL (ref 150–450)
RBC: 6.25 x10E6/uL — ABNORMAL HIGH (ref 4.14–5.80)
RDW: 14.7 % (ref 11.6–15.4)
WBC: 6 10*3/uL (ref 3.4–10.8)

## 2022-08-26 LAB — VITAMIN B12: Vitamin B-12: 1589 pg/mL — ABNORMAL HIGH (ref 232–1245)

## 2022-08-26 LAB — LIPID PANEL
Chol/HDL Ratio: 3.6 ratio (ref 0.0–5.0)
Cholesterol, Total: 186 mg/dL (ref 100–199)
HDL: 51 mg/dL (ref >39)
LDL Chol Calc (NIH): 120 mg/dL — ABNORMAL HIGH (ref 0–99)
Triglycerides: 80 mg/dL (ref 0–149)
VLDL Cholesterol Cal: 15 mg/dL (ref 5–40)

## 2022-08-26 NOTE — Progress Notes (Signed)
Please let patient's family know that his lab work looks good.  No concerns at this time.  His anemia remains stable.  Continue with current medication regimen.  Follow up as discussed.

## 2022-08-28 NOTE — Addendum Note (Signed)
Addended by: Jon Billings on: 08/28/2022 08:35 AM   Modules accepted: Level of Service

## 2022-08-31 ENCOUNTER — Other Ambulatory Visit: Payer: Self-pay | Admitting: Family Medicine

## 2022-09-01 NOTE — Telephone Encounter (Signed)
Requested Prescriptions  Pending Prescriptions Disp Refills  . KLOR-CON M20 20 MEQ tablet [Pharmacy Med Name: Klor-Con M20 20 MEQ Oral Tablet Extended Release] 120 tablet 0    Sig: Take 2 tablets by mouth twice daily     Endocrinology:  Minerals - Potassium Supplementation Passed - 08/31/2022  6:56 PM      Passed - K in normal range and within 360 days    Potassium  Date Value Ref Range Status  08/24/2022 4.1 3.5 - 5.2 mmol/L Final  05/07/2013 3.8 3.5 - 5.1 mmol/L Final         Passed - Cr in normal range and within 360 days    Creatinine  Date Value Ref Range Status  05/07/2013 1.21 0.60 - 1.30 mg/dL Final   Creatinine, Ser  Date Value Ref Range Status  08/24/2022 1.19 0.76 - 1.27 mg/dL Final         Passed - Valid encounter within last 12 months    Recent Outpatient Visits          1 week ago Annual physical exam   [facility] Jon Billings, NP   3 months ago Alzheimer's dementia without behavioral disturbance ([place])   [facility] Jon Billings, NP   6 months ago Alzheimer's dementia without behavioral disturbance ([place])   [facility] Jon Billings, NP   9 months ago Alzheimer's dementia without behavioral disturbance ([place])   [facility] Jon Billings, NP   1 year ago Benign essential HTN   [facility] Jon Billings, NP      Future Appointments            In 2 months Jon Billings, NP [facility], [place]   In 11 months McGowan, Gordan Payment [place]

## 2022-10-06 ENCOUNTER — Other Ambulatory Visit: Payer: Self-pay | Admitting: Nurse Practitioner

## 2022-10-07 NOTE — Telephone Encounter (Signed)
Requested Prescriptions  Pending Prescriptions Disp Refills   potassium chloride SA (KLOR-CON M) 20 MEQ tablet [Pharmacy Med Name: Potassium Chloride Crys ER 20 MEQ Oral Tablet Extended Release] 120 tablet 1    Sig: TAKE 2  BY MOUTH TWICE DAILY     Endocrinology:  Minerals - Potassium Supplementation Passed - 10/06/2022  6:12 PM      Passed - K in normal range and within 360 days    Potassium  Date Value Ref Range Status  08/24/2022 4.1 3.5 - 5.2 mmol/L Final  05/07/2013 3.8 3.5 - 5.1 mmol/L Final         Passed - Cr in normal range and within 360 days    Creatinine  Date Value Ref Range Status  05/07/2013 1.21 0.60 - 1.30 mg/dL Final   Creatinine, Ser  Date Value Ref Range Status  08/24/2022 1.19 0.76 - 1.27 mg/dL Final         Passed - Valid encounter within last 12 months    Recent Outpatient Visits           1 month ago Annual physical exam   [facility] Jon Billings, NP   4 months ago Alzheimer's dementia without behavioral disturbance ([place])   [facility] Jon Billings, NP   7 months ago Alzheimer's dementia without behavioral disturbance ([place])   [facility] Jon Billings, NP   11 months ago Alzheimer's dementia without behavioral disturbance ([place])   [facility] Jon Billings, NP   1 year ago Benign essential HTN   [facility] Jon Billings, NP       Future Appointments             In 1 month Jon Billings, NP [employer], [place]   In 9 months McGowan, Gordan Payment [place]

## 2022-11-12 ENCOUNTER — Telehealth: Payer: Self-pay | Admitting: Nurse Practitioner

## 2022-11-12 NOTE — Telephone Encounter (Signed)
Copied from [place] [phone]. Topic: General - Other >> Nov 12, 2022  2:35 PM Chapman Fitch wrote: Reason for CRM: pt received a bill  for $295 that wasn't covered by medicare for DOS 6.8.23/ tey advised pt the reason for the bill was for an AWV visit done too soon / they advised pt to have office resubmit the claim under a different visit type instead of wellness visit / please advise Pt had AWV on 7.27.22 at another location

## 2022-11-23 NOTE — Telephone Encounter (Signed)
Sent email to billing department to review patient's account as charges for

## 2022-11-24 ENCOUNTER — Ambulatory Visit (INDEPENDENT_AMBULATORY_CARE_PROVIDER_SITE_OTHER): Payer: Medicare Other | Admitting: Nurse Practitioner

## 2022-11-24 ENCOUNTER — Encounter: Payer: Self-pay | Admitting: Nurse Practitioner

## 2022-11-24 VITALS — BP 136/68 | HR 83 | Temp 97.7°F | Wt 162.3 lb

## 2022-11-24 DIAGNOSIS — I10 Essential (primary) hypertension: Secondary | ICD-10-CM | POA: Diagnosis not present

## 2022-11-24 DIAGNOSIS — R220 Localized swelling, mass and lump, head: Secondary | ICD-10-CM

## 2022-11-24 DIAGNOSIS — G309 Alzheimer's disease, unspecified: Secondary | ICD-10-CM

## 2022-11-24 DIAGNOSIS — G6289 Other specified polyneuropathies: Secondary | ICD-10-CM

## 2022-11-24 DIAGNOSIS — F0280 Dementia in other diseases classified elsewhere without behavioral disturbance: Secondary | ICD-10-CM

## 2022-11-24 DIAGNOSIS — E7800 Pure hypercholesterolemia, unspecified: Secondary | ICD-10-CM

## 2022-11-24 DIAGNOSIS — D518 Other vitamin B12 deficiency anemias: Secondary | ICD-10-CM

## 2022-11-24 NOTE — Assessment & Plan Note (Signed)
Labs ordered at visit today.  Will make recommendations based on lab results.   

## 2022-11-24 NOTE — Assessment & Plan Note (Signed)
Chronic.  Controlled.  Continue with current medication regimen.  Labs ordered today.  Return to clinic in 6 months for reevaluation.  Call sooner if concerns arise.  ? ?

## 2022-11-24 NOTE — Assessment & Plan Note (Signed)
Chronic.  Controlled.  Continue with current medication regimen on Amlodipine '10mg'$  and Maxide 75/'50mg'$  daily.  No refills needed today. Will recheck potassium today.  Labs ordered today.  Return to clinic in 3 months for reevaluation.  Call sooner if concerns arise.

## 2022-11-24 NOTE — Assessment & Plan Note (Signed)
Chronic. Ongoing.  Son feels like patient is doing well.  Denies concerns at visit today.  Follow up in 3 months.  Call sooner if concerns arise.

## 2022-11-24 NOTE — Progress Notes (Signed)
BP 136/68 (BP Location: Right Arm, Cuff Size: Normal)   Pulse 83   Temp 97.7 F (36.5 C) (Oral)   Wt 162 lb 4.8 oz (73.6 kg)   SpO2 98%   BMI 22.64 kg/m    Subjective:    Patient ID: Ian Parker, male    DOB: 08/12/1931, 89 y.o.   MRN: [id number]  HPI: Ian Parker is a 89 y.o. male  Chief Complaint  Patient presents with   Hyperlipidemia   Hypertension   HYPERTENSION Hypertension status: controlled  Satisfied with current treatment? yes Duration of hypertension: years BP monitoring frequency:  not checking BP range:  BP medication side effects:  no Medication compliance: excellent compliance Previous BP meds: amlodipine and Maxide Aspirin: no Recurrent headaches: no Visual changes: no Palpitations: no Dyspnea: no Chest pain: no Lower extremity edema: no Dizzy/lightheaded: no  MEMORY Patient's son accompanied patient today.  His memory has been the same.  Doing well without concerns.  Patient's son states that he has a bump behind his left ear.  Doesn't hurt but he just feels it back there.  Not sure how long it has been there.    Relevant past medical, surgical, family and social history reviewed and updated as indicated. Interim medical history since our last visit reviewed. Allergies and medications reviewed and updated.  Review of Systems  Eyes:  Negative for visual disturbance.  Respiratory:  Negative for shortness of breath.   Cardiovascular:  Negative for chest pain and leg swelling.  Musculoskeletal:        Difficulty ambulating  Neurological:  Negative for light-headedness and headaches.    Per HPI unless specifically indicated above     Objective:    BP 136/68 (BP Location: Right Arm, Cuff Size: Normal)   Pulse 83   Temp 97.7 F (36.5 C) (Oral)   Wt 162 lb 4.8 oz (73.6 kg)   SpO2 98%   BMI 22.64 kg/m   Wt Readings from Last 3 Encounters:  11/24/22 162 lb 4.8 oz (73.6 kg)  08/24/22 160 lb 9.6 oz (72.8 kg)  07/23/22 160 lb (72.6  kg)    Physical Exam Vitals and nursing note reviewed.  Constitutional:      General: He is not in acute distress.    Appearance: Normal appearance. He is not ill-appearing, toxic-appearing or diaphoretic.  HENT:     Head: Normocephalic.      Right Ear: External ear normal.     Left Ear: External ear normal.     Nose: Nose normal. No congestion or rhinorrhea.     Mouth/Throat:     Mouth: Mucous membranes are moist.  Eyes:     General:        Right eye: No discharge.        Left eye: No discharge.     Extraocular Movements: Extraocular movements intact.     Conjunctiva/sclera: Conjunctivae normal.     Pupils: Pupils are equal, round, and reactive to light.  Cardiovascular:     Rate and Rhythm: Normal rate and regular rhythm.     Heart sounds: No murmur heard. Pulmonary:     Effort: Pulmonary effort is normal. No respiratory distress.     Breath sounds: Normal breath sounds. No wheezing, rhonchi or rales.  Abdominal:     General: Abdomen is flat. Bowel sounds are normal.  Musculoskeletal:     Cervical back: Normal range of motion and neck supple.  Skin:    General: Skin is  warm and dry.     Capillary Refill: Capillary refill takes less than 2 seconds.  Neurological:     General: No focal deficit present.     Mental Status: He is alert and oriented to person, place, and time.  Psychiatric:        Mood and Affect: Mood normal.        Behavior: Behavior normal.        Thought Content: Thought content normal.        Judgment: Judgment normal.     Results for orders placed or performed in visit on 08/24/22  CBC w/Diff  Result Value Ref Range   WBC 6.0 3.4 - 10.8 x10E3/uL   RBC 6.25 (H) 4.14 - 5.80 x10E6/uL   Hemoglobin 16.1 13.0 - 17.7 g/dL   Hematocrit 50.3 37.5 - 51.0 %   MCV 81 79 - 97 fL   MCH 25.8 (L) 26.6 - 33.0 pg   MCHC 32.0 31.5 - 35.7 g/dL   RDW 14.7 11.6 - 15.4 %   Platelets 188 150 - 450 x10E3/uL   Neutrophils 58 Not Estab. %   Lymphs 30 Not Estab. %    Monocytes 9 Not Estab. %   Eos 2 Not Estab. %   Basos 1 Not Estab. %   Neutrophils Absolute 3.6 1.4 - 7.0 x10E3/uL   Lymphocytes Absolute 1.8 0.7 - 3.1 x10E3/uL   Monocytes Absolute 0.5 0.1 - 0.9 x10E3/uL   EOS (ABSOLUTE) 0.1 0.0 - 0.4 x10E3/uL   Basophils Absolute 0.0 0.0 - 0.2 x10E3/uL   Immature Granulocytes 0 Not Estab. %   Immature Grans (Abs) 0.0 0.0 - 0.1 x10E3/uL  Comp Met (CMET)  Result Value Ref Range   Glucose 84 70 - 99 mg/dL   BUN 13 8 - 27 mg/dL   Creatinine, Ser 1.19 0.76 - 1.27 mg/dL   eGFR 58 (L) >59 mL/min/1.73   BUN/Creatinine Ratio 11 10 - 24   Sodium 142 134 - 144 mmol/L   Potassium 4.1 3.5 - 5.2 mmol/L   Chloride 101 96 - 106 mmol/L   CO2 25 20 - 29 mmol/L   Calcium 9.6 8.6 - 10.2 mg/dL   Total Protein 7.0 6.0 - 8.5 g/dL   Albumin 4.9 (H) 3.7 - 4.7 g/dL   Globulin, Total 2.1 1.5 - 4.5 g/dL   Albumin/Globulin Ratio 2.3 (H) 1.2 - 2.2   Bilirubin Total 0.3 0.0 - 1.2 mg/dL   Alkaline Phosphatase 68 44 - 121 IU/L   AST 21 0 - 40 IU/L   ALT 24 0 - 44 IU/L  B12  Result Value Ref Range   Vitamin B-12 1,589 (H) 232 - 1,245 pg/mL  Lipid Profile  Result Value Ref Range   Cholesterol, Total 186 100 - 199 mg/dL   Triglycerides 80 0 - 149 mg/dL   HDL 51 >39 mg/dL   VLDL Cholesterol Cal 15 5 - 40 mg/dL   LDL Chol Calc (NIH) 120 (H) 0 - 99 mg/dL   Chol/HDL Ratio 3.6 0.0 - 5.0 ratio      Assessment & Plan:   Problem List Items Addressed This Visit       Cardiovascular and Mediastinum   Benign essential HTN - Primary    Chronic.  Controlled.  Continue with current medication regimen on Amlodipine '10mg'$  and Maxide 75/'50mg'$  daily.  No refills needed today. Will recheck potassium today.  Labs ordered today.  Return to clinic in 3 months for reevaluation.  Call sooner if concerns  arise.        Relevant Orders   Comp Met (CMET)     Nervous and Auditory   Peripheral neuropathy ([place])    Labs ordered at visit today.  Will make recommendations based on lab results.          Alzheimer's dementia without behavioral disturbance (HCC)    Chronic. Ongoing.  Son feels like patient is doing well.  Denies concerns at visit today.  Follow up in 3 months.  Call sooner if concerns arise.        Other   Anemia due to vitamin B12 deficiency    Chronic.  Controlled.  Continue with current medication regimen.  Labs ordered today.  Return to clinic in 6 months for reevaluation.  Call sooner if concerns arise.        Relevant Orders   B12   Other Visit Diagnoses     Elevated LDL cholesterol level       Relevant Orders   Lipid Profile   Mass of head       Will order [place] to evaluate area.  Will make recommendations based on imaging results.   Relevant Orders   US Soft Tissue Head/Neck (NON-THYROID)        Follow up plan: Return in about 3 months (around 02/23/2023) for HTN, HLD, DM2 FU.

## 2022-11-25 LAB — COMPREHENSIVE METABOLIC PANEL
ALT: 29 IU/L (ref 0–44)
AST: 23 IU/L (ref 0–40)
Albumin/Globulin Ratio: 2.1 (ref 1.2–2.2)
Albumin: 4.6 g/dL (ref 3.7–4.7)
Alkaline Phosphatase: 69 IU/L (ref 44–121)
BUN/Creatinine Ratio: 11 (ref 10–24)
BUN: 13 mg/dL (ref 8–27)
Bilirubin Total: 0.5 mg/dL (ref 0.0–1.2)
CO2: 24 mmol/L (ref 20–29)
Calcium: 9.5 mg/dL (ref 8.6–10.2)
Chloride: 100 mmol/L (ref 96–106)
Creatinine, Ser: 1.16 mg/dL (ref 0.76–1.27)
Globulin, Total: 2.2 g/dL (ref 1.5–4.5)
Glucose: 79 mg/dL (ref 70–99)
Potassium: 4 mmol/L (ref 3.5–5.2)
Sodium: 141 mmol/L (ref 134–144)
Total Protein: 6.8 g/dL (ref 6.0–8.5)
eGFR: 60 mL/min/{1.73_m2} (ref >59)

## 2022-11-25 LAB — LIPID PANEL
Chol/HDL Ratio: 3.7 ratio (ref 0.0–5.0)
Cholesterol, Total: 189 mg/dL (ref 100–199)
HDL: 51 mg/dL (ref >39)
LDL Chol Calc (NIH): 121 mg/dL — ABNORMAL HIGH (ref 0–99)
Triglycerides: 95 mg/dL (ref 0–149)
VLDL Cholesterol Cal: 17 mg/dL (ref 5–40)

## 2022-11-25 LAB — VITAMIN B12: Vitamin B-12: 1540 pg/mL — ABNORMAL HIGH (ref 232–1245)

## 2022-11-25 NOTE — Progress Notes (Signed)
Please let patietn's daughter know that his lab work looks good.   Anemia is well controlled. Liver, kidneys and electrolytes look good.  No concerns at this time.

## 2022-11-30 NOTE — Telephone Encounter (Signed)
Spoke with patient and advised that I had reached out to billing department to see if any adjustments can be made to charges as requested.  Advised patient that I would call back if an adjustment can be made. Patient acknowledged understanding and thanked me for calling.

## 2022-12-01 ENCOUNTER — Ambulatory Visit
Admission: RE | Admit: 2022-12-01 | Discharge: 2022-12-01 | Disposition: A | Discharge status: Home / Self Care | Payer: Medicare Other | Source: Ambulatory Visit | Attending: Nurse Practitioner | Admitting: Nurse Practitioner

## 2022-12-01 DIAGNOSIS — R220 Localized swelling, mass and lump, head: Secondary | ICD-10-CM | POA: Insufficient documentation

## 2022-12-02 NOTE — Progress Notes (Signed)
Please let patient's daughter, Ian Parker, know that the area behind the ear that was looked at during the ultrasound is an enlarged lymph node.  It is recommended that the patient have a CT with contract to look at this further.  If she would like to do this I will go ahead and order it.

## 2022-12-02 NOTE — Progress Notes (Signed)
Order placed

## 2022-12-02 NOTE — Addendum Note (Signed)
Addended by: Jon Billings on: 12/02/2022 10:34 AM   Modules accepted: Orders

## 2022-12-07 ENCOUNTER — Other Ambulatory Visit: Payer: Self-pay | Admitting: Nurse Practitioner

## 2022-12-08 NOTE — Telephone Encounter (Signed)
Requested Prescriptions  Pending Prescriptions Disp Refills   KLOR-CON M20 20 MEQ tablet [Pharmacy Med Name: Klor-Con M20 20 MEQ Oral Tablet Extended Release] 120 tablet 0    Sig: Take 2 tablets by mouth twice daily     Endocrinology:  Minerals - Potassium Supplementation Passed - 12/07/2022 12:56 PM      Passed - K in normal range and within 360 days    Potassium  Date Value Ref Range Status  11/24/2022 4.0 3.5 - 5.2 mmol/L Final  05/07/2013 3.8 3.5 - 5.1 mmol/L Final         Passed - Cr in normal range and within 360 days    Creatinine  Date Value Ref Range Status  05/07/2013 1.21 0.60 - 1.30 mg/dL Final   Creatinine, Ser  Date Value Ref Range Status  11/24/2022 1.16 0.76 - 1.27 mg/dL Final         Passed - Valid encounter within last 12 months    Recent Outpatient Visits           2 weeks ago Benign essential HTN   [place], NP   3 months ago Annual physical exam   [place], NP   6 months ago Alzheimer's dementia without behavioral disturbance [facility])   [place] Jon Billings, NP   9 months ago Alzheimer's dementia without behavioral disturbance [facility])   [place] Jon Billings, NP   1 year ago Alzheimer's dementia without behavioral disturbance [facility])   [place] Jon Billings, NP       Future Appointments             In 2 months Jon Billings, NP [place], [place]   In 7 months McGowan, Gordan Payment [place]

## 2022-12-18 ENCOUNTER — Ambulatory Visit
Admission: RE | Admit: 2022-12-18 | Discharge: 2022-12-18 | Disposition: A | Discharge status: Home / Self Care | Payer: Medicare Other | Source: Ambulatory Visit | Attending: Nurse Practitioner | Admitting: Nurse Practitioner

## 2022-12-18 DIAGNOSIS — R220 Localized swelling, mass and lump, head: Secondary | ICD-10-CM | POA: Insufficient documentation

## 2022-12-18 MED ORDER — IOHEXOL 300 MG/ML  SOLN
75.0000 mL | Freq: Once | INTRAMUSCULAR | Status: AC | PRN
  Administered 2022-12-18: 75 mL via INTRAVENOUS

## 2022-12-21 NOTE — Progress Notes (Signed)
Please let patient know that the ultrasound was unremarkable.  No concerns at this time.  We will monitor it to see if the area grows in size.

## 2023-01-12 ENCOUNTER — Other Ambulatory Visit: Payer: Self-pay | Admitting: Nurse Practitioner

## 2023-01-13 NOTE — Telephone Encounter (Signed)
Requested Prescriptions  Pending Prescriptions Disp Refills   KLOR-CON M20 20 MEQ tablet [Pharmacy Med Name: Klor-Con M20 20 MEQ Oral Tablet Extended Release] 360 tablet 0    Sig: Take 2 tablets by mouth twice daily     Endocrinology:  Minerals - Potassium Supplementation Passed - 01/12/2023  2:48 PM      Passed - K in normal range and within 360 days    Potassium  Date Value Ref Range Status  11/24/2022 4.0 3.5 - 5.2 mmol/L Final  05/07/2013 3.8 3.5 - 5.1 mmol/L Final         Passed - Cr in normal range and within 360 days    Creatinine  Date Value Ref Range Status  05/07/2013 1.21 0.60 - 1.30 mg/dL Final   Creatinine, Ser  Date Value Ref Range Status  11/24/2022 1.16 0.76 - 1.27 mg/dL Final         Passed - Valid encounter within last 12 months    Recent Outpatient Visits           1 month ago Benign essential HTN   [place], NP   4 months ago Annual physical exam   [name], NP   7 months ago Alzheimer's dementia without behavioral disturbance [facility])   [place], NP   10 months ago Alzheimer's dementia without behavioral disturbance [facility])   [place] Jon Billings, NP   1 year ago Alzheimer's dementia without behavioral disturbance [facility])   [place] Jon Billings, NP       Future Appointments             In 1 month Jon Billings, NP [place], [facility]   In 6 months McGowan, Gordan Payment [place]

## 2023-02-25 ENCOUNTER — Encounter: Payer: Self-pay | Admitting: Nurse Practitioner

## 2023-02-25 ENCOUNTER — Ambulatory Visit (INDEPENDENT_AMBULATORY_CARE_PROVIDER_SITE_OTHER): Payer: Medicare Other | Admitting: Nurse Practitioner

## 2023-02-25 VITALS — BP 126/68 | HR 69 | Temp 97.5°F | Wt 164.6 lb

## 2023-02-25 DIAGNOSIS — F0280 Dementia in other diseases classified elsewhere without behavioral disturbance: Secondary | ICD-10-CM

## 2023-02-25 DIAGNOSIS — D518 Other vitamin B12 deficiency anemias: Secondary | ICD-10-CM | POA: Diagnosis not present

## 2023-02-25 DIAGNOSIS — G309 Alzheimer's disease, unspecified: Secondary | ICD-10-CM

## 2023-02-25 DIAGNOSIS — R7309 Other abnormal glucose: Secondary | ICD-10-CM

## 2023-02-25 DIAGNOSIS — I10 Essential (primary) hypertension: Secondary | ICD-10-CM

## 2023-02-25 DIAGNOSIS — G6289 Other specified polyneuropathies: Secondary | ICD-10-CM

## 2023-02-25 DIAGNOSIS — R220 Localized swelling, mass and lump, head: Secondary | ICD-10-CM

## 2023-02-25 NOTE — Assessment & Plan Note (Signed)
Labs ordered at visit today.  Will make recommendations based on lab results.   

## 2023-02-25 NOTE — Progress Notes (Signed)
BP 126/68   Pulse 69   Temp (!) 97.5 F (36.4 C) (Oral)   Wt 164 lb 9.6 oz (74.7 kg)   SpO2 98%   BMI 22.96 kg/m    Subjective:    Patient ID: Ian Parker, male    DOB: Mar 23, 1931, 89 y.o.   MRN: [id number]  HPI: Ian Parker is a 89 y.o. male  Chief Complaint  Patient presents with   Hypertension   Memory Loss   HYPERTENSION Hypertension status: controlled  Satisfied with current treatment? yes Duration of hypertension: years BP monitoring frequency:  not checking BP range:  BP medication side effects:  no Medication compliance: excellent compliance Previous BP meds: amlodipine and Maxide Aspirin: no Recurrent headaches: no Visual changes: no Palpitations: no Dyspnea: no Chest pain: no Lower extremity edema: no Dizzy/lightheaded: no  MEMORY Patient's daughter and wife accompanied patient today.  His memory has been the same.  No changes since last visit.  Doing well without concerns.  Patient's son states that he has a bump behind his left ear.  Doesn't hurt but he just feels it back there.  Not sure how long it has been there.    Relevant past medical, surgical, family and social history reviewed and updated as indicated. Interim medical history since our last visit reviewed. Allergies and medications reviewed and updated.  Review of Systems  Eyes:  Negative for visual disturbance.  Respiratory:  Negative for shortness of breath.   Cardiovascular:  Negative for chest pain and leg swelling.  Musculoskeletal:        Difficulty ambulating  Neurological:  Negative for light-headedness and headaches.       Memory decline    Per HPI unless specifically indicated above     Objective:    BP 126/68   Pulse 69   Temp (!) 97.5 F (36.4 C) (Oral)   Wt 164 lb 9.6 oz (74.7 kg)   SpO2 98%   BMI 22.96 kg/m   Wt Readings from Last 3 Encounters:  02/25/23 164 lb 9.6 oz (74.7 kg)  11/24/22 162 lb 4.8 oz (73.6 kg)  08/24/22 160 lb 9.6 oz (72.8 kg)     Physical Exam Vitals and nursing note reviewed.  Constitutional:      General: He is not in acute distress.    Appearance: Normal appearance. He is not ill-appearing, toxic-appearing or diaphoretic.  HENT:     Head: Normocephalic.      Right Ear: External ear normal.     Left Ear: External ear normal.     Nose: Nose normal. No congestion or rhinorrhea.     Mouth/Throat:     Mouth: Mucous membranes are moist.  Eyes:     General:        Right eye: No discharge.        Left eye: No discharge.     Extraocular Movements: Extraocular movements intact.     Conjunctiva/sclera: Conjunctivae normal.     Pupils: Pupils are equal, round, and reactive to light.  Cardiovascular:     Rate and Rhythm: Normal rate and regular rhythm.     Heart sounds: No murmur heard. Pulmonary:     Effort: Pulmonary effort is normal. No respiratory distress.     Breath sounds: Normal breath sounds. No wheezing, rhonchi or rales.  Abdominal:     General: Abdomen is flat. Bowel sounds are normal.  Musculoskeletal:     Cervical back: Normal range of motion and neck supple.  Skin:    General: Skin is warm and dry.     Capillary Refill: Capillary refill takes less than 2 seconds.  Neurological:     General: No focal deficit present.     Mental Status: He is alert and oriented to person, place, and time.  Psychiatric:        Mood and Affect: Mood normal.        Behavior: Behavior normal.        Thought Content: Thought content normal.        Judgment: Judgment normal.     Results for orders placed or performed in visit on 11/24/22  Comp Met (CMET)  Result Value Ref Range   Glucose 79 70 - 99 mg/dL   BUN 13 8 - 27 mg/dL   Creatinine, Ser 2.95 0.76 - 1.27 mg/dL   eGFR 60 >62 ZH/YQM/5.78   BUN/Creatinine Ratio 11 10 - 24   Sodium 141 134 - 144 mmol/L   Potassium 4.0 3.5 - 5.2 mmol/L   Chloride 100 96 - 106 mmol/L   CO2 24 20 - 29 mmol/L   Calcium 9.5 8.6 - 10.2 mg/dL   Total Protein 6.8 6.0 - 8.5  g/dL   Albumin 4.6 3.7 - 4.7 g/dL   Globulin, Total 2.2 1.5 - 4.5 g/dL   Albumin/Globulin Ratio 2.1 1.2 - 2.2   Bilirubin Total 0.5 0.0 - 1.2 mg/dL   Alkaline Phosphatase 69 44 - 121 IU/L   AST 23 0 - 40 IU/L   ALT 29 0 - 44 IU/L  B12  Result Value Ref Range   Vitamin B-12 1,540 (H) 232 - 1,245 pg/mL  Lipid Profile  Result Value Ref Range   Cholesterol, Total 189 100 - 199 mg/dL   Triglycerides 95 0 - 149 mg/dL   HDL 51 >46 mg/dL   VLDL Cholesterol Cal 17 5 - 40 mg/dL   LDL Chol Calc (NIH) 962 (H) 0 - 99 mg/dL   Chol/HDL Ratio 3.7 0.0 - 5.0 ratio      Assessment & Plan:   Problem List Items Addressed This Visit       Cardiovascular and Mediastinum   Benign essential HTN - Primary    Chronic.  Controlled.  Continue with current medication regimen on Amlodipine 10mg  and Maxide 75/50mg  daily.  Labs ordered today.  Return to clinic in 3 months for reevaluation.  Call sooner if concerns arise.        Relevant Orders   Comp Met (CMET)     Nervous and Auditory   Peripheral neuropathy (HCC)    Chronic.  Controlled.  Continue with current medication regimen.  Labs ordered today.  Return to clinic in 6 months for reevaluation.  Call sooner if concerns arise.        Alzheimer's dementia without behavioral disturbance    Chronic. Ongoing.  Patient's daughter feels like patient is stable.  Denies concerns at visit today.  Follow up in 3 months.  Call sooner if concerns arise.        Other   Anemia due to vitamin B12 deficiency    Labs ordered at visit today.  Will make recommendations based on lab results.        Relevant Orders   CBC w/Diff   B12   Other Visit Diagnoses     Mass of head       Stable from previous visits. Imaging was unremarkable.        Follow up plan: Return  in about 3 months (around 05/27/2023) for HTN, HLD, DM2 FU.

## 2023-02-25 NOTE — Assessment & Plan Note (Signed)
Chronic. Ongoing.  Patient's daughter feels like patient is stable.  Denies concerns at visit today.  Follow up in 3 months.  Call sooner if concerns arise.

## 2023-02-25 NOTE — Progress Notes (Signed)
Called and scheduled patient for appointment on 04/12/2023 @ 3:00 pm.

## 2023-02-25 NOTE — Assessment & Plan Note (Signed)
Chronic.  Controlled.  Continue with current medication regimen on Amlodipine  and Maxide 75/50mg  daily.  Labs ordered today.  Return to clinic in 3 months for reevaluation.  Call sooner if concerns arise.

## 2023-02-25 NOTE — Assessment & Plan Note (Signed)
Chronic.  Controlled.  Continue with current medication regimen.  Labs ordered today.  Return to clinic in 6 months for reevaluation.  Call sooner if concerns arise.  ? ?

## 2023-02-26 ENCOUNTER — Ambulatory Visit: Payer: Medicare Other | Admitting: Nurse Practitioner

## 2023-02-26 LAB — CBC WITH DIFFERENTIAL/PLATELET
Basophils Absolute: 0 10*3/uL (ref 0.0–0.2)
Basos: 0 %
EOS (ABSOLUTE): 0.1 10*3/uL (ref 0.0–0.4)
Eos: 1 %
Hematocrit: 48.8 % (ref 37.5–51.0)
Hemoglobin: 15.4 g/dL (ref 13.0–17.7)
Immature Grans (Abs): 0 10*3/uL (ref 0.0–0.1)
Immature Granulocytes: 0 %
Lymphocytes Absolute: 1.4 10*3/uL (ref 0.7–3.1)
Lymphs: 32 %
MCH: 25.6 pg — ABNORMAL LOW (ref 26.6–33.0)
MCHC: 31.6 g/dL (ref 31.5–35.7)
MCV: 81 fL (ref 79–97)
Monocytes Absolute: 0.4 10*3/uL (ref 0.1–0.9)
Monocytes: 9 %
Neutrophils Absolute: 2.6 10*3/uL (ref 1.4–7.0)
Neutrophils: 58 %
Platelets: 169 10*3/uL (ref 150–450)
RBC: 6.01 x10E6/uL — ABNORMAL HIGH (ref 4.14–5.80)
RDW: 14.2 % (ref 11.6–15.4)
WBC: 4.6 10*3/uL (ref 3.4–10.8)

## 2023-02-26 LAB — COMPREHENSIVE METABOLIC PANEL
ALT: 26 IU/L (ref 0–44)
AST: 20 IU/L (ref 0–40)
Albumin/Globulin Ratio: 2.2 (ref 1.2–2.2)
Albumin: 4.4 g/dL (ref 3.7–4.7)
Alkaline Phosphatase: 66 IU/L (ref 44–121)
BUN/Creatinine Ratio: 10 (ref 10–24)
BUN: 11 mg/dL (ref 8–27)
Bilirubin Total: 0.5 mg/dL (ref 0.0–1.2)
CO2: 25 mmol/L (ref 20–29)
Calcium: 9.4 mg/dL (ref 8.6–10.2)
Chloride: 101 mmol/L (ref 96–106)
Creatinine, Ser: 1.1 mg/dL (ref 0.76–1.27)
Globulin, Total: 2 g/dL (ref 1.5–4.5)
Glucose: 110 mg/dL — ABNORMAL HIGH (ref 70–99)
Potassium: 3.5 mmol/L (ref 3.5–5.2)
Sodium: 142 mmol/L (ref 134–144)
Total Protein: 6.4 g/dL (ref 6.0–8.5)
eGFR: 64 mL/min/{1.73_m2} (ref >59)

## 2023-02-26 LAB — VITAMIN B12: Vitamin B-12: 1298 pg/mL — ABNORMAL HIGH (ref 232–1245)

## 2023-02-26 NOTE — Progress Notes (Signed)
Good Morning Ian Parker.  Rev Solomon's labs look good.  His glucose was a little bit elevated so I added on an A1c to see if he is diabetic.  There will likely not be any medication changes.  Otherwise, his lab work looks Museum/gallery exhibitions officer.  Stable from prior.

## 2023-02-26 NOTE — Addendum Note (Signed)
Addended by: Larae Grooms on: 02/26/2023 08:06 AM   Modules accepted: Orders

## 2023-04-09 ENCOUNTER — Other Ambulatory Visit: Payer: Self-pay | Admitting: Nurse Practitioner

## 2023-04-09 NOTE — Telephone Encounter (Signed)
Requested Prescriptions  Pending Prescriptions Disp Refills   KLOR-CON M20 20 MEQ tablet [Pharmacy Med Name: Klor-Con M20 20 MEQ Oral Tablet Extended Release] 360 tablet 3    Sig: Take 2 tablets by mouth twice daily     Endocrinology:  Minerals - Potassium Supplementation Passed - 04/09/2023  9:19 AM      Passed - K in normal range and within 360 days    Potassium  Date Value Ref Range Status  02/25/2023 3.5 3.5 - 5.2 mmol/L Final  05/07/2013 3.8 3.5 - 5.1 mmol/L Final         Passed - Cr in normal range and within 360 days    Creatinine  Date Value Ref Range Status  05/07/2013 1.21 0.60 - 1.30 mg/dL Final   Creatinine, Ser  Date Value Ref Range Status  02/25/2023 1.10 0.76 - 1.27 mg/dL Final         Passed - Valid encounter within last 12 months    Recent Outpatient Visits           1 month ago Benign essential HTN   [place] [facility] Larae Grooms, NP   4 months ago Benign essential HTN   [place] [facility] Larae Grooms, NP   7 months ago Annual physical exam   [place] [facility] Larae Grooms, NP   10 months ago Alzheimer's dementia without behavioral disturbance [facility])   [place] [facility] Larae Grooms, NP   1 year ago Alzheimer's dementia without behavioral disturbance [facility])   [place] [facility] Larae Grooms, NP       Future Appointments             In 1 month Mecum, Oswaldo Conroy, PA-C [facility], [facility]   In 3 months McGowan, Elana Alm [facility]

## 2023-04-12 ENCOUNTER — Ambulatory Visit (INDEPENDENT_AMBULATORY_CARE_PROVIDER_SITE_OTHER): Payer: Medicare Other

## 2023-04-12 VITALS — Wt 164.0 lb

## 2023-04-12 DIAGNOSIS — Z0000 Encounter for general adult medical examination without abnormal findings: Secondary | ICD-10-CM | POA: Diagnosis not present
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
## 2023-04-12 NOTE — Patient Instructions (Signed)
Ian Parker , Thank you for taking time to come for your Medicare Wellness Visit. I appreciate your ongoing commitment to your health goals. Please review the following plan we discussed and let me know if I can assist you in the future.   These are the goals we discussed:  Goals       "I want to be sure my dad has all the available resources" (pt-stated)      Care Coordination Interventions:   Follow up phone call to patient's daughter to assess for any additional community resource needs. Per patient's daughter, she has received the community resources emailed to her and will call this Child psychotherapist with any questions or additional resource needs Informed patient's daughter of referral to [facility] for their case management/ in home care program Confirmed continued short term memory loss, family continues to assist with medication adherence Patient's daughter will contact this Child psychotherapist with any additional community resource needs        DIET - EAT MORE FRUITS AND VEGETABLES      Patient Stated      No goals        This is a list of the screening recommended for you and due dates:  Health Maintenance  Topic Date Due   DTaP/Tdap/Td vaccine (1 - Tdap) Never done   Zoster (Shingles) Vaccine (1 of 2) Never done   COVID-19 Vaccine (4 - 2023-24 season) 07/03/2022   Flu Shot  06/03/2023   Medicare Annual Wellness Visit  04/11/2024   Pneumonia Vaccine  Completed   HPV Vaccine  Aged Out    Advanced directives: no  Conditions/risks identified: none  Next appointment: Follow up in one year for your annual wellness visit. 04/17/24 @ 9:15 am by phone  Preventive Care 65 Years and Older, Male  Preventive care refers to lifestyle choices and visits with your health care provider that can promote health and wellness. What does preventive care include? A yearly physical exam. This is also called an annual well check. Dental exams once or twice a year. Routine eye exams. Ask  your health care provider how often you should have your eyes checked. Personal lifestyle choices, including: Daily care of your teeth and gums. Regular physical activity. Eating a healthy diet. Avoiding tobacco and drug use. Limiting alcohol use. Practicing safe sex. Taking low doses of aspirin every day. Taking vitamin and mineral supplements as recommended by your health care provider. What happens during an annual well check? The services and screenings done by your health care provider during your annual well check will depend on your age, overall health, lifestyle risk factors, and family history of disease. Counseling  Your health care provider may ask you questions about your: Alcohol use. Tobacco use. Drug use. Emotional well-being. Home and relationship well-being. Sexual activity. Eating habits. History of falls. Memory and ability to understand (cognition). Work and work Astronomer. Screening  You may have the following tests or measurements: Height, weight, and BMI. Blood pressure. Lipid and cholesterol levels. These may be checked every 5 years, or more frequently if you are over 48 years old. Skin check. Lung cancer screening. You may have this screening every year starting at age 56 if you have a 30-pack-year history of smoking and currently smoke or have quit within the past 15 years. Fecal occult blood test (FOBT) of the stool. You may have this test every year starting at age 54. Flexible sigmoidoscopy or colonoscopy. You may have a sigmoidoscopy every  5 years or a colonoscopy every 10 years starting at age 30. Prostate cancer screening. Recommendations will vary depending on your family history and other risks. Hepatitis C blood test. Hepatitis B blood test. Sexually transmitted disease (STD) testing. Diabetes screening. This is done by checking your blood sugar (glucose) after you have not eaten for a while (fasting). You may have this done every 1-3  years. Abdominal aortic aneurysm (AAA) screening. You may need this if you are a current or former smoker. Osteoporosis. You may be screened starting at age 57 if you are at high risk. Talk with your health care provider about your test results, treatment options, and if necessary, the need for more tests. Vaccines  Your health care provider may recommend certain vaccines, such as: Influenza vaccine. This is recommended every year. Tetanus, diphtheria, and acellular pertussis (Tdap, Td) vaccine. You may need a Td booster every 10 years. Zoster vaccine. You may need this after age 70. Pneumococcal 13-valent conjugate (PCV13) vaccine. One dose is recommended after age 55. Pneumococcal polysaccharide (PPSV23) vaccine. One dose is recommended after age 50. Talk to your health care provider about which screenings and vaccines you need and how often you need them. This information is not intended to replace advice given to you by your health care provider. Make sure you discuss any questions you have with your health care provider. Document Released: 11/15/2015 Document Revised: 07/08/2016 Document Reviewed: 08/20/2015 Elsevier Interactive Patient Education  2017 [employer].  Fall Prevention in the Home Falls can cause injuries. They can happen to people of all ages. There are many things you can do to make your home safe and to help prevent falls. What can I do on the outside of my home? Regularly fix the edges of walkways and driveways and fix any cracks. Remove anything that might make you trip as you walk through a door, such as a raised step or threshold. Trim any bushes or trees on the path to your home. Use bright outdoor lighting. Clear any walking paths of anything that might make someone trip, such as rocks or tools. Regularly check to see if handrails are loose or broken. Make sure that both sides of any steps have handrails. Any raised decks and porches should have guardrails on the  edges. Have any leaves, snow, or ice cleared regularly. Use sand or salt on walking paths during winter. Clean up any spills in your garage right away. This includes oil or grease spills. What can I do in the bathroom? Use night lights. Install grab bars by the toilet and in the tub and shower. Do not use towel bars as grab bars. Use non-skid mats or decals in the tub or shower. If you need to sit down in the shower, use a plastic, non-slip stool. Keep the floor dry. Clean up any water that spills on the floor as soon as it happens. Remove soap buildup in the tub or shower regularly. Attach bath mats securely with double-sided non-slip rug tape. Do not have throw rugs and other things on the floor that can make you trip. What can I do in the bedroom? Use night lights. Make sure that you have a light by your bed that is easy to reach. Do not use any sheets or blankets that are too big for your bed. They should not hang down onto the floor. Have a firm chair that has side arms. You can use this for support while you get dressed. Do not have throw  rugs and other things on the floor that can make you trip. What can I do in the kitchen? Clean up any spills right away. Avoid walking on wet floors. Keep items that you use a lot in easy-to-reach places. If you need to reach something above you, use a strong step stool that has a grab bar. Keep electrical cords out of the way. Do not use floor polish or wax that makes floors slippery. If you must use wax, use non-skid floor wax. Do not have throw rugs and other things on the floor that can make you trip. What can I do with my stairs? Do not leave any items on the stairs. Make sure that there are handrails on both sides of the stairs and use them. Fix handrails that are broken or loose. Make sure that handrails are as long as the stairways. Check any carpeting to make sure that it is firmly attached to the stairs. Fix any carpet that is loose or  worn. Avoid having throw rugs at the top or bottom of the stairs. If you do have throw rugs, attach them to the floor with carpet tape. Make sure that you have a light switch at the top of the stairs and the bottom of the stairs. If you do not have them, ask someone to add them for you. What else can I do to help prevent falls? Wear shoes that: Do not have high heels. Have rubber bottoms. Are comfortable and fit you well. Are closed at the toe. Do not wear sandals. If you use a stepladder: Make sure that it is fully opened. Do not climb a closed stepladder. Make sure that both sides of the stepladder are locked into place. Ask someone to hold it for you, if possible. Clearly mark and make sure that you can see: Any grab bars or handrails. First and last steps. Where the edge of each step is. Use tools that help you move around (mobility aids) if they are needed. These include: Canes. Walkers. Scooters. Crutches. Turn on the lights when you go into a dark area. Replace any light bulbs as soon as they burn out. Set up your furniture so you have a clear path. Avoid moving your furniture around. If any of your floors are uneven, fix them. If there are any pets around you, be aware of where they are. Review your medicines with your doctor. Some medicines can make you feel dizzy. This can increase your chance of falling. Ask your doctor what other things that you can do to help prevent falls. This information is not intended to replace advice given to you by your health care provider. Make sure you discuss any questions you have with your health care provider. Document Released: 08/15/2009 Document Revised: 03/26/2016 Document Reviewed: 11/23/2014 Elsevier Interactive Patient Education  2017 [employer].

## 2023-04-13 ENCOUNTER — Encounter: Payer: Self-pay | Admitting: Nurse Practitioner

## 2023-05-14 ENCOUNTER — Other Ambulatory Visit: Payer: Self-pay | Admitting: Nurse Practitioner

## 2023-05-14 NOTE — Telephone Encounter (Signed)
Requested Prescriptions  Pending Prescriptions Disp Refills   amLODipine (NORVASC) 10 MG tablet [Pharmacy Med Name: amLODIPine Besylate 10 MG Oral Tablet] 90 tablet 1    Sig: Take 1 tablet by mouth once daily     Cardiovascular: Calcium Channel Blockers 2 Passed - 05/14/2023  6:52 AM      Passed - Last BP in normal range    BP Readings from Last 1 Encounters:  02/25/23 126/68         Passed - Last Heart Rate in normal range    Pulse Readings from Last 1 Encounters:  02/25/23 69         Passed - Valid encounter within last 6 months    Recent Outpatient Visits           2 months ago Benign essential HTN   [place] [facility] Larae Grooms, NP   5 months ago Benign essential HTN   [facility] Larae Grooms, NP   8 months ago Annual physical exam   [place] [facility] Larae Grooms, NP   11 months ago Alzheimer's dementia without behavioral disturbance [facility])   [place] [facility] Larae Grooms, NP   1 year ago Alzheimer's dementia without behavioral disturbance [facility])   [place] [facility] Larae Grooms, NP       Future Appointments             In 1 week Mecum, Oswaldo Conroy, PA-C [place] [facility]   In 2 months McGowan, Elana Alm [facility]

## 2023-05-26 ENCOUNTER — Other Ambulatory Visit: Payer: Self-pay | Admitting: Nurse Practitioner

## 2023-05-27 ENCOUNTER — Ambulatory Visit (INDEPENDENT_AMBULATORY_CARE_PROVIDER_SITE_OTHER): Payer: Medicare Other | Admitting: Physician Assistant

## 2023-05-27 ENCOUNTER — Encounter: Payer: Self-pay | Admitting: Physician Assistant

## 2023-05-27 VITALS — BP 113/65 | HR 69 | Ht 71.0 in | Wt 162.4 lb

## 2023-05-27 DIAGNOSIS — I10 Essential (primary) hypertension: Secondary | ICD-10-CM | POA: Diagnosis not present

## 2023-05-27 DIAGNOSIS — F0280 Dementia in other diseases classified elsewhere without behavioral disturbance: Secondary | ICD-10-CM

## 2023-05-27 DIAGNOSIS — G309 Alzheimer's disease, unspecified: Secondary | ICD-10-CM | POA: Diagnosis not present

## 2023-05-27 DIAGNOSIS — R739 Hyperglycemia, unspecified: Secondary | ICD-10-CM | POA: Diagnosis not present

## 2023-05-27 LAB — BAYER DCA HB A1C WAIVED: HB A1C (BAYER DCA - WAIVED): 5.9 % — ABNORMAL HIGH (ref 4.8–5.6)

## 2023-05-27 NOTE — Telephone Encounter (Signed)
Requested Prescriptions  Pending Prescriptions Disp Refills   triamterene-hydrochlorothiazide (MAXZIDE) 75-50 MG tablet [Pharmacy Med Name: Triamterene-HCTZ 75-50 MG Oral Tablet] 90 tablet 0    Sig: Take 1 tablet by mouth once daily     Cardiovascular: Diuretic Combos Passed - 05/26/2023  8:31 PM      Passed - K in normal range and within 180 days    Potassium  Date Value Ref Range Status  02/25/2023 3.5 3.5 - 5.2 mmol/L Final  05/07/2013 3.8 3.5 - 5.1 mmol/L Final         Passed - Na in normal range and within 180 days    Sodium  Date Value Ref Range Status  02/25/2023 142 134 - 144 mmol/L Final  05/07/2013 144 136 - 145 mmol/L Final         Passed - Cr in normal range and within 180 days    Creatinine  Date Value Ref Range Status  05/07/2013 1.21 0.60 - 1.30 mg/dL Final   Creatinine, Ser  Date Value Ref Range Status  02/25/2023 1.10 0.76 - 1.27 mg/dL Final         Passed - Last BP in normal range    BP Readings from Last 1 Encounters:  05/27/23 113/65         Passed - Valid encounter within last 6 months    Recent Outpatient Visits           Today Hyperglycemia   [place] [facility] Mecum, Oswaldo Conroy, PA-C   3 months ago Benign essential HTN   [place] [facility] Larae Grooms, NP   6 months ago Benign essential HTN   [place] [facility] Larae Grooms, NP   9 months ago Annual physical exam   [place] [facility] Larae Grooms, NP   1 year ago Alzheimer's dementia without behavioral disturbance [facility])   [place] [facility] Larae Grooms, NP       Future Appointments             In 2 months McGowan, Elana Alm [facility] [place]   In 3 months Larae Grooms, NP [place] [facility]

## 2023-05-27 NOTE — Progress Notes (Signed)
Established Patient Office Visit  Name: Ian Parker   MRN: [id number]    DOB: Jan 07, 1931   Date:06/01/2023  Today's Provider: Jacquelin Hawking, MHS, PA-C Introduced myself to the patient as a PA-C and provided education on APPs in clinical practice.         Subjective  Chief Complaint  Chief Complaint  Patient presents with   Hypertension   Dementia         HPI  He is here today with his son   Hypertension: - Medications: Amlodipine 10 mg PO every day  - Compliance: Good compliance per family  - Checking BP at home: Not checking at home  - Denies any SOB, CP, vision changes, LE edema, medication SEs, or symptoms of hypotension - Diet: Overall normal diet, his son reports he has a pretty good appetite, and is eating 2-3 meals per day  - Exercise: He and his son report he is staying active   Memory changes/Dementia  His son denies recent memory changes  Reports he is still taking Razadyne 8 mg PO QAM and Memantine 10 mg PO every BID   They deny mood changes or sleep issues at this time   Patient Active Problem List   Diagnosis Date Noted   Altered mental status 08/07/2019   Alzheimer's dementia without behavioral disturbance (HCC) 03/30/2018   Anemia due to vitamin B12 deficiency 07/12/2015   Prostate cancer (HCC) 06/26/2015   ED (erectile dysfunction) of organic origin 06/26/2015   Hemorrhoids 06/26/2015   Benign prostatic hyperplasia with lower urinary tract symptoms 06/26/2015   H/O nutritional disorder 08/21/2014   Peripheral neuropathy (HCC) 08/21/2014   H/O malignant neoplasm of prostate 08/21/2014   Benign essential HTN 04/25/2014    Past Surgical History:  Procedure Laterality Date   cryoablation of prostate  02/23/2006   ESOPHAGOGASTRODUODENOSCOPY N/A 03/14/2015   Procedure: ESOPHAGOGASTRODUODENOSCOPY (EGD);  Surgeon: Wallace Cullens, MD;  Location: [facility] ENDOSCOPY;  Service: Gastroenterology;  Laterality: N/A;   THYROIDECTOMY     Subtotal    Family  History  Problem Relation Age of Onset   Heart attack Mother    Brain cancer Father        tumor unknown if cancer   Diabetes Sister    Hypertension Sister    Hyperlipidemia Sister    Heart attack Sister    Prostate cancer Brother    Kidney disease Neg Hx    Kidney cancer Neg Hx    Bladder Cancer Neg Hx     Social History   Tobacco Use   Smoking status: Never   Smokeless tobacco: Never  Substance Use Topics   Alcohol use: No    Alcohol/week: 0.0 standard drinks of alcohol     Current Outpatient Medications:    amLODipine (NORVASC) 10 MG tablet, Take 1 tablet by mouth once daily, Disp: 90 tablet, Rfl: 1   cholecalciferol (VITAMIN D) 1000 UNITS tablet, Take 1,000 Units by mouth daily. Two tablets once daily, Disp: , Rfl:    galantamine (RAZADYNE ER) 8 MG 24 hr capsule, Take 1 capsule (8 mg total) by mouth daily with breakfast., Disp: 90 capsule, Rfl: 1   hydrocortisone (ANUSOL-HC) 2.5 % rectal cream, Place 1 Application rectally 2 (two) times daily., Disp: 30 g, Rfl: 1   memantine (NAMENDA) 10 MG tablet, Take 1 tablet (10 mg total) by mouth 2 (two) times daily., Disp: 180 tablet, Rfl: 1   potassium chloride SA (KLOR-CON M20)  20 MEQ tablet, Take 2 tablets by mouth twice daily, Disp: 360 tablet, Rfl: 3   vitamin B-12 (CYANOCOBALAMIN) 1000 MCG tablet, Take 1,000 mcg by mouth daily., Disp: , Rfl:    triamterene-hydrochlorothiazide (MAXZIDE) 75-50 MG tablet, Take 1 tablet by mouth once daily, Disp: 90 tablet, Rfl: 0  Allergies  Allergen Reactions   Ace Inhibitors    Peanuts [Peanut Oil]    Penicillins    Sulfa Antibiotics     I personally reviewed active problem list, medication list, allergies, health maintenance, notes from last encounter, lab results with the patient/caregiver today.   Review of Systems  Eyes:  Negative for blurred vision and double vision.  Cardiovascular:  Negative for chest pain, palpitations and leg swelling.  Musculoskeletal:  Negative for falls.   Neurological:  Negative for dizziness, loss of consciousness and headaches.  Psychiatric/Behavioral:  Positive for memory loss. Negative for depression. The patient is not nervous/anxious and does not have insomnia.       Objective  Vitals:   05/27/23 1053  BP: 113/65  Pulse: 69  SpO2: 97%  Weight: 162 lb 6.4 oz (73.7 kg)  Height: 5\' 11"  (1.803 m)    Body mass index is 22.65 kg/m.  Physical Exam Constitutional:      General: He is awake.     Appearance: Normal appearance. He is well-developed and well-groomed.  HENT:     Head: Normocephalic and atraumatic.     Comments: Small postauricular nodule palpated behind left ear- mobile, non-tender, approx 1 cm in diameter  Cardiovascular:     Rate and Rhythm: Normal rate and regular rhythm.     Pulses: Normal pulses.          Radial pulses are 2+ on the right side and 2+ on the left side.     Heart sounds: Normal heart sounds. No murmur heard.    No friction rub. No gallop.  Pulmonary:     Effort: Pulmonary effort is normal.     Breath sounds: Normal breath sounds. No decreased air movement. No decreased breath sounds, wheezing, rhonchi or rales.  Musculoskeletal:     Right lower leg: 1+ Edema present.     Left lower leg: 1+ Edema present.  Neurological:     Mental Status: He is alert.  Psychiatric:        Behavior: Behavior is cooperative.      Recent Results (from the past 2160 hour(s))  Bayer DCA Hb A1c Waived (STAT)     Status: Abnormal   Collection Time: 05/27/23 11:26 AM  Result Value Ref Range   HB A1C (BAYER DCA - WAIVED) 5.9 (H) 4.8 - 5.6 %    Comment:          Prediabetes: 5.7 - 6.4          Diabetes: >6.4          Glycemic control for adults with diabetes: <7.0      PHQ2/9:    05/27/2023   10:57 AM 04/12/2023    3:05 PM 02/25/2023   11:30 AM 11/24/2022    2:18 PM 06/09/2022    3:19 PM  Depression screen PHQ 2/9  Decreased Interest 0 0 1 0 0  Down, Depressed, Hopeless 0 0 0 1 1  PHQ - 2 Score 0 0 1  1 1   Altered sleeping 0 0 0 0   Tired, decreased energy 0 0 1 1   Change in appetite 0 0 0 0   Feeling bad  or failure about yourself  0 0 0 0   Trouble concentrating 0 0 1 1   Moving slowly or fidgety/restless 0 0 1 0   Suicidal thoughts 0 0 0 0   PHQ-9 Score 0 0 4 3   Difficult doing work/chores Not difficult at all Not difficult at all Not difficult at all Somewhat difficult       Fall Risk:    05/27/2023   10:56 AM 04/12/2023    3:08 PM 02/25/2023   11:30 AM 11/24/2022    2:18 PM 04/09/2022    9:04 AM  Fall Risk   Falls in the past year? 0 0 0 0 0  Number falls in past yr: 0 0 0 0 0  Injury with Fall? 0 0 0 0 0  Risk for fall due to : No Fall Risks No Fall Risks No Fall Risks No Fall Risks   Follow up Falls evaluation completed Falls prevention discussed;Falls evaluation completed Falls evaluation completed Falls evaluation completed Falls evaluation completed;Education provided;Falls prevention discussed      Functional Status Survey:      Assessment & Plan  Problem List Items Addressed This Visit       Cardiovascular and Mediastinum   Benign essential HTN    Chronic, historic condition Appears well managed with current regimen comprised of Amlodipine 10 mg PO every day and Maxzide 75-50 mg PO every day  Continue current regimen Recommend routine home BP checks for monitoring Recommend he continues to stay active  Follow up in 3 months or sooner if concerns arise         Nervous and Auditory   Alzheimer's dementia without behavioral disturbance (HCC) - Primary    Chronic, ongoing Patient's son states he seems to be at baseline- they have not noticed new changes or progression He is taking memantine 10 mg PO BID and galantamine 8 mg PO every day - appears to be tolerating well Continue current regimen Recommend routine MMSE for tracking  Follow up in 3 months or sooner if concerns arise       Other Visit Diagnoses     Hyperglycemia     Suspect  chronic Reviewed previous lab results- will check A1c today for monitoring Results to dictate further management Recommend regular monitoring every 6 months or as indicated by A1c results Follow up in 6 months or sooner if concerns arise     Relevant Orders   Bayer DCA Hb A1c Waived (STAT) (Completed)        Return in about 3 months (around 08/27/2023) for HTN, memory concerns .   I, Breigh Annett E Konstantin Lehnen, PA-C, have reviewed all documentation for this visit. The documentation on 06/01/23 for the exam, diagnosis, procedures, and orders are all accurate and complete.   Jacquelin Hawking, MHS, PA-C [facility]

## 2023-05-31 NOTE — Progress Notes (Signed)
Your A1c was 5.9 which places you in prediabetic range.Alc is a number that reflects the 3 month average of your blood glucose and is used to determine if someone has prediabetes or diabetes  No medications are indicated at this time. However, I do recommend that you reduce your excess sugar and carbohydrate intake to help prevent progression to type 2 diabetes. We can recheck this periodically to make sure it isn't getting worse.

## 2023-06-01 NOTE — Assessment & Plan Note (Signed)
Chronic, historic condition Appears well managed with current regimen comprised of Amlodipine 10 mg PO every day and Maxzide 75-50 mg PO every day  Continue current regimen Recommend routine home BP checks for monitoring Recommend he continues to stay active  Follow up in 3 months or sooner if concerns arise

## 2023-06-01 NOTE — Assessment & Plan Note (Signed)
Chronic, ongoing Patient's son states he seems to be at baseline- they have not noticed new changes or progression He is taking memantine 10 mg PO BID and galantamine 8 mg PO every day - appears to be tolerating well Continue current regimen Recommend routine MMSE for tracking  Follow up in 3 months or sooner if concerns arise

## 2023-07-07 ENCOUNTER — Other Ambulatory Visit: Payer: Self-pay | Admitting: Nurse Practitioner

## 2023-07-08 NOTE — Telephone Encounter (Signed)
Requested Prescriptions  Pending Prescriptions Disp Refills   galantamine (RAZADYNE ER) 8 MG 24 hr capsule [Pharmacy Med Name: Galantamine Hydrobromide ER 8 MG Oral Capsule Extended Release 24 Hour] 90 capsule 1    Sig: TAKE 1 CAPSULE BY MOUTH ONCE DAILY WITH BREAKFAST     Neurology:  Alzheimer's Agents - galantamine Passed - 07/07/2023  8:55 PM      Passed - Cr in normal range and within 360 days    Creatinine  Date Value Ref Range Status  05/07/2013 1.21 0.60 - 1.30 mg/dL Final   Creatinine, Ser  Date Value Ref Range Status  02/25/2023 1.10 0.76 - 1.27 mg/dL Final         Passed - ALT in normal range and within 360 days    ALT  Date Value Ref Range Status  02/25/2023 26 0 - 44 IU/L Final   SGPT (ALT)  Date Value Ref Range Status  05/05/2013 39 12 - 78 U/L Final         Passed - AST in normal range and within 360 days    AST  Date Value Ref Range Status  02/25/2023 20 0 - 40 IU/L Final   SGOT(AST)  Date Value Ref Range Status  05/05/2013 23 15 - 37 Unit/L Final         Passed - eGFR is 9 or above and within 360 days    EGFR (African American)  Date Value Ref Range Status  05/07/2013 >60  Final   GFR calc Af Amer  Date Value Ref Range Status  08/08/2019 54 (L) >60 mL/min Final   EGFR (Non-African Amer.)  Date Value Ref Range Status  05/07/2013 56 (L)  Final    Comment:    eGFR values <66mL/min/1.73 m2 may be an indication of chronic kidney disease (CKD). Calculated eGFR is useful in patients with stable renal function. The eGFR calculation will not be reliable in acutely ill patients when serum creatinine is changing rapidly. It is not useful in  patients on dialysis. The eGFR calculation may not be applicable to patients at the low and high extremes of body sizes, pregnant women, and vegetarians.    GFR calc non Af Amer  Date Value Ref Range Status  08/08/2019 46 (L) >60 mL/min Final   eGFR  Date Value Ref Range Status  02/25/2023 64 >59 mL/min/1.73  Final         Passed - Valid encounter within last 6 months    Recent Outpatient Visits           1 month ago Alzheimer's dementia without behavioral disturbance (HCC)   [place] [facility] Mecum, Erin E, PA-C   4 months ago Benign essential HTN   [place] [facility] Larae Grooms, NP   7 months ago Benign essential HTN   [place] [facility] Larae Grooms, NP   10 months ago Annual physical exam   [place] [facility] Larae Grooms, NP   1 year ago Alzheimer's dementia without behavioral disturbance [facility])   [place] [facility] Larae Grooms, NP       Future Appointments             In 2 weeks McGowan, Elana Alm [facility] [place]   In 1 month Larae Grooms, NP  [facility]

## 2023-07-19 ENCOUNTER — Other Ambulatory Visit: Payer: Self-pay

## 2023-07-19 DIAGNOSIS — C61 Malignant neoplasm of prostate: Secondary | ICD-10-CM

## 2023-07-22 ENCOUNTER — Other Ambulatory Visit: Payer: Medicare Other

## 2023-07-22 DIAGNOSIS — C61 Malignant neoplasm of prostate: Secondary | ICD-10-CM

## 2023-07-23 LAB — PSA: Prostate Specific Ag, Serum: 0.4 ng/mL (ref 0.0–4.0)

## 2023-07-27 NOTE — Progress Notes (Signed)
08/02/23 4:56 PM   Ian Parker [birth year] [id number]  Referring provider:  Larae Grooms, NP [address] [place],  [place] [address]  Urological history  1. Prostate cancer -PSA (07/2023) 0.4  -s/p cryoablation of his prostate cancer in 2007- iPSA 5.3 ng/mL   2. LU TS - cysto NED 07/2020   3. ED - deferred treatments to PCP   HPI: Ian Parker is a 89 y.o.male with history of prostate cancer, LUTS, and erectile dysfunction, who presents today for follow-up with his son, Deniece Portela.    No urinary complaints.  Patient denies any modifying or aggravating factors.  Patient denies any recent UTI's, gross hematuria, dysuria or suprapubic/flank pain.  Patient denies any fevers, chills, nausea or vomiting.     IPSS     Row Name 07/28/23 1500         International Prostate Symptom Score   How often have you had the sensation of not emptying your bladder? Less than 1 in 5     How often have you had to urinate less than every two hours? Less than 1 in 5 times     How often have you found you stopped and started again several times when you urinated? Less than 1 in 5 times     How often have you found it difficult to postpone urination? Less than 1 in 5 times     How often have you had a weak urinary stream? Less than 1 in 5 times     How often have you had to strain to start urination? Less than 1 in 5 times     How many times did you typically get up at night to urinate? 1 Time     Total IPSS Score 7       Quality of Life due to urinary symptoms   If you were to spend the rest of your life with your urinary condition just the way it is now how would you feel about that? Pleased              Score:  1-7 Mild 8-19 Moderate 20-35 Severe     PMH: Past Medical History:  Diagnosis Date   Abnormal EKG    Abnormal PSA    BPH (benign prostatic hyperplasia)    Dysuria    Erectile dysfunction    Gastric ulcer    Goiter    Hemorrhoid    Inflammatory disease of prostate     Irritation of perirectal skin    Prostate cancer [facility])    Rectal burning    Skin lesion     Surgical History: Past Surgical History:  Procedure Laterality Date   cryoablation of prostate  02/23/2006   ESOPHAGOGASTRODUODENOSCOPY N/A 03/14/2015   Procedure: ESOPHAGOGASTRODUODENOSCOPY (EGD);  Surgeon: Wallace Cullens, MD;  Location: [facility] ENDOSCOPY;  Service: Gastroenterology;  Laterality: N/A;   THYROIDECTOMY     Subtotal    Home Medications:  Allergies as of 07/28/2023       Reactions   Ace Inhibitors    Peanuts [peanut Oil]    Penicillins    Sulfa Antibiotics         Medication List        Accurate as of July 28, 2023 11:59 PM. If you have any questions, ask your nurse or doctor.          amLODipine 10 MG tablet Commonly known as: NORVASC Take 1 tablet by mouth once daily   cholecalciferol 1000  units tablet Commonly known as: VITAMIN D Take 1,000 Units by mouth daily. Two tablets once daily   cyanocobalamin 1000 MCG tablet Commonly known as: VITAMIN B12 Take 1,000 mcg by mouth daily.   galantamine 8 MG 24 hr capsule Commonly known as: RAZADYNE ER TAKE 1 CAPSULE BY MOUTH ONCE DAILY WITH BREAKFAST   hydrocortisone 2.5 % rectal cream Commonly known as: Anusol-HC Place 1 Application rectally 2 (two) times daily.   Klor-Con M20 20 MEQ tablet Generic drug: potassium chloride SA Take 2 tablets by mouth twice daily   memantine 10 MG tablet Commonly known as: NAMENDA Take 1 tablet (10 mg total) by mouth 2 (two) times daily.   triamterene-hydrochlorothiazide 75-50 MG tablet Commonly known as: MAXZIDE Take 1 tablet by mouth once daily        Allergies:  Allergies  Allergen Reactions   Ace Inhibitors    Peanuts [Peanut Oil]    Penicillins    Sulfa Antibiotics     Family History: Family History  Problem Relation Age of Onset   Heart attack Mother    Brain cancer Father        tumor unknown if cancer   Diabetes Sister    Hypertension Sister     Hyperlipidemia Sister    Heart attack Sister    Prostate cancer Brother    Kidney disease Neg Hx    Kidney cancer Neg Hx    Bladder Cancer Neg Hx     Social History:  reports that he has never smoked. He has never used smokeless tobacco. He reports that he does not drink alcohol and does not use drugs.   Physical Exam: BP 119/70   Pulse 82   Constitutional:  Well nourished. Alert and oriented, No acute distress. HEENT: [place] AT, moist mucus membranes.  Trachea midline Cardiovascular: No clubbing, cyanosis, or edema. Respiratory: Normal respiratory effort, no increased work of breathing. Neurologic: Grossly intact, no focal deficits, moving all 4 extremities. Psychiatric: Normal mood and affect.   Laboratory Data: Results for orders placed or performed in visit on 07/22/23  PSA  Result Value Ref Range   Prostate Specific Ag, Serum 0.4 0.0 - 4.0 ng/mL       Latest Ref Rng & Units 02/25/2023   11:45 AM 08/24/2022    2:14 PM 05/22/2022    2:14 PM  CBC  WBC 3.4 - 10.8 x10E3/uL 4.6  6.0  6.1   Hemoglobin 13.0 - 17.7 g/dL 95.6  21.3  08.6   Hematocrit 37.5 - 51.0 % 48.8  50.3  47.4   Platelets 150 - 450 x10E3/uL 169  188  164        Latest Ref Rng & Units 02/25/2023   11:45 AM 11/24/2022    2:35 PM 08/24/2022    2:14 PM  CMP  Glucose 70 - 99 mg/dL 578  79  84   BUN 8 - 27 mg/dL 11  13  13    Creatinine 0.76 - 1.27 mg/dL 4.69  6.29  5.28   Sodium 134 - 144 mmol/L 142  141  142   Potassium 3.5 - 5.2 mmol/L 3.5  4.0  4.1   Chloride 96 - 106 mmol/L 101  100  101   CO2 20 - 29 mmol/L 25  24  25    Calcium 8.6 - 10.2 mg/dL 9.4  9.5  9.6   Total Protein 6.0 - 8.5 g/dL 6.4  6.8  7.0   Total Bilirubin 0.0 - 1.2 mg/dL 0.5  0.5  0.3   Alkaline Phos 44 - 121 IU/L 66  69  68   AST 0 - 40 IU/L 20  23  21    ALT 0 - 44 IU/L 26  29  24    I have reviewed the labs.    Pertinent Imaging: N/A  Assessment & Plan:    Prostate cancer  - s/p cryoablation of prostate cancer in 2007 - PSA  0.4  - PSA;Future 12 months   BPH with mild LUTS  -at goal with urinary symptoms   Return in about 1 year (around 07/27/2024) for PSA, I PSS, PVR .  Cloretta Ned   [facility] [address] [place], [place] [address] [phone]

## 2023-07-28 ENCOUNTER — Ambulatory Visit (INDEPENDENT_AMBULATORY_CARE_PROVIDER_SITE_OTHER): Payer: Medicare Other | Admitting: Urology

## 2023-07-28 ENCOUNTER — Encounter: Payer: Self-pay | Admitting: Urology

## 2023-07-28 VITALS — BP 119/70 | HR 82

## 2023-07-28 DIAGNOSIS — R399 Unspecified symptoms and signs involving the genitourinary system: Secondary | ICD-10-CM

## 2023-07-28 DIAGNOSIS — N401 Enlarged prostate with lower urinary tract symptoms: Secondary | ICD-10-CM | POA: Diagnosis not present

## 2023-07-28 DIAGNOSIS — Z8546 Personal history of malignant neoplasm of prostate: Secondary | ICD-10-CM

## 2023-07-28 DIAGNOSIS — C61 Malignant neoplasm of prostate: Secondary | ICD-10-CM

## 2023-08-24 ENCOUNTER — Other Ambulatory Visit: Payer: Self-pay | Admitting: Nurse Practitioner

## 2023-08-25 NOTE — Telephone Encounter (Signed)
Requested Prescriptions  Pending Prescriptions Disp Refills   memantine (NAMENDA) 10 MG tablet [Pharmacy Med Name: Memantine HCl 10 MG Oral Tablet] 180 tablet 0    Sig: Take 1 tablet by mouth twice daily     Neurology:  Alzheimer's Agents 2 Passed - 08/24/2023  1:15 PM      Passed - Cr in normal range and within 360 days    Creatinine  Date Value Ref Range Status  05/07/2013 1.21 0.60 - 1.30 mg/dL Final   Creatinine, Ser  Date Value Ref Range Status  02/25/2023 1.10 0.76 - 1.27 mg/dL Final         Passed - eGFR is 5 or above and within 360 days    EGFR (African American)  Date Value Ref Range Status  05/07/2013 >60  Final   GFR calc Af Amer  Date Value Ref Range Status  08/08/2019 54 (L) >60 mL/min Final   EGFR (Non-African Amer.)  Date Value Ref Range Status  05/07/2013 56 (L)  Final    Comment:    eGFR values <61mL/min/1.73 m2 may be an indication of chronic kidney disease (CKD). Calculated eGFR is useful in patients with stable renal function. The eGFR calculation will not be reliable in acutely ill patients when serum creatinine is changing rapidly. It is not useful in  patients on dialysis. The eGFR calculation may not be applicable to patients at the low and high extremes of body sizes, pregnant women, and vegetarians.    GFR calc non Af Amer  Date Value Ref Range Status  08/08/2019 46 (L) >60 mL/min Final   eGFR  Date Value Ref Range Status  02/25/2023 64 >59 mL/min/1.73 Final         Passed - Valid encounter within last 6 months    Recent Outpatient Visits           3 months ago Alzheimer's dementia without behavioral disturbance (HCC)   [place] [facility] Mecum, Erin E, PA-C   6 months ago Benign essential HTN   [place] [facility] Larae Grooms, NP   9 months ago Benign essential HTN   [place] [facility] Larae Grooms, NP   1 year ago Annual physical exam   [place] [facility] Larae Grooms, NP   1 year ago Alzheimer's dementia without behavioral disturbance [facility])   [place] [facility] Larae Grooms, NP       Future Appointments             In 1 week Larae Grooms, NP [place] [facility]   In 11 months McGowan, Elana Alm [facility]              triamterene-hydrochlorothiazide (MAXZIDE) 75-50 MG tablet [Pharmacy Med Name: Triamterene-HCTZ 75-50 MG Oral Tablet] 90 tablet 0    Sig: Take 1 tablet by mouth once daily     Cardiovascular: Diuretic Combos Passed - 08/24/2023  1:15 PM      Passed - K in normal range and within 180 days    Potassium  Date Value Ref Range Status  02/25/2023 3.5 3.5 - 5.2 mmol/L Final  05/07/2013 3.8 3.5 - 5.1 mmol/L Final         Passed - Na in normal range and within 180 days    Sodium  Date Value Ref Range Status  02/25/2023 142 134 - 144 mmol/L Final  05/07/2013 144 136 - 145 mmol/L Final  Passed - Cr in normal range and within 180 days    Creatinine  Date Value Ref Range Status  05/07/2013 1.21 0.60 - 1.30 mg/dL Final   Creatinine, Ser  Date Value Ref Range Status  02/25/2023 1.10 0.76 - 1.27 mg/dL Final         Passed - Last BP in normal range    BP Readings from Last 1 Encounters:  07/28/23 119/70         Passed - Valid encounter within last 6 months    Recent Outpatient Visits           3 months ago Alzheimer's dementia without behavioral disturbance (HCC)   [place] [facility] Mecum, Erin E, PA-C   6 months ago Benign essential HTN   [place] Larae Grooms, NP   9 months ago Benign essential HTN   [place] [facility] Larae Grooms, NP   1 year ago Annual physical exam   [place] [facility] Larae Grooms, NP   1 year ago Alzheimer's dementia without behavioral disturbance [facility])   [place] [facility]  Larae Grooms, NP       Future Appointments             In 1 week Larae Grooms, NP [place] [facility]   In 11 months McGowan, Elana Alm [facility]

## 2023-09-01 NOTE — Progress Notes (Unsigned)
There were no vitals taken for this visit.   Subjective:    Patient ID: Ian Parker, male    DOB: 1931/09/19, 89 y.o.   MRN: [id number]  HPI: Ian Parker is a 89 y.o. male  No chief complaint on file.  HYPERTENSION Hypertension status: controlled  Satisfied with current treatment? yes Duration of hypertension: years BP monitoring frequency:  not checking BP range:  BP medication side effects:  no Medication compliance: excellent compliance Previous BP meds: amlodipine and Maxide Aspirin: no Recurrent headaches: no Visual changes: no Palpitations: no Dyspnea: no Chest pain: no Lower extremity edema: no Dizzy/lightheaded: no  MEMORY Patient's daughter and wife accompanied patient today.  His memory has been the same.  No changes since last visit.  Doing well without concerns.  Patient's son states that he has a bump behind his left ear.  Doesn't hurt but he just feels it back there.  Not sure how long it has been there.    Relevant past medical, surgical, family and social history reviewed and updated as indicated. Interim medical history since our last visit reviewed. Allergies and medications reviewed and updated.  Review of Systems  Eyes:  Negative for visual disturbance.  Respiratory:  Negative for shortness of breath.   Cardiovascular:  Negative for chest pain and leg swelling.  Musculoskeletal:        Difficulty ambulating  Neurological:  Negative for light-headedness and headaches.       Memory decline    Per HPI unless specifically indicated above     Objective:    There were no vitals taken for this visit.  Wt Readings from Last 3 Encounters:  05/27/23 162 lb 6.4 oz (73.7 kg)  04/12/23 164 lb (74.4 kg)  02/25/23 164 lb 9.6 oz (74.7 kg)    Physical Exam Vitals and nursing note reviewed.  Constitutional:      General: He is not in acute distress.    Appearance: Normal appearance. He is not ill-appearing, toxic-appearing or diaphoretic.   HENT:     Head: Normocephalic.      Right Ear: External ear normal.     Left Ear: External ear normal.     Nose: Nose normal. No congestion or rhinorrhea.     Mouth/Throat:     Mouth: Mucous membranes are moist.  Eyes:     General:        Right eye: No discharge.        Left eye: No discharge.     Extraocular Movements: Extraocular movements intact.     Conjunctiva/sclera: Conjunctivae normal.     Pupils: Pupils are equal, round, and reactive to light.  Cardiovascular:     Rate and Rhythm: Normal rate and regular rhythm.     Heart sounds: No murmur heard. Pulmonary:     Effort: Pulmonary effort is normal. No respiratory distress.     Breath sounds: Normal breath sounds. No wheezing, rhonchi or rales.  Abdominal:     General: Abdomen is flat. Bowel sounds are normal.  Musculoskeletal:     Cervical back: Normal range of motion and neck supple.  Skin:    General: Skin is warm and dry.     Capillary Refill: Capillary refill takes less than 2 seconds.  Neurological:     General: No focal deficit present.     Mental Status: He is alert and oriented to person, place, and time.  Psychiatric:        Mood and Affect: Mood  normal.        Behavior: Behavior normal.        Thought Content: Thought content normal.        Judgment: Judgment normal.    Results for orders placed or performed in visit on 07/22/23  PSA  Result Value Ref Range   Prostate Specific Ag, Serum 0.4 0.0 - 4.0 ng/mL      Assessment & Plan:   Problem List Items Addressed This Visit       Cardiovascular and Mediastinum   Benign essential HTN - Primary     Nervous and Auditory   Peripheral neuropathy (HCC)   Alzheimer's dementia without behavioral disturbance (HCC)     Genitourinary   Prostate cancer (HCC)     Other   Anemia due to vitamin B12 deficiency     Follow up plan: No follow-ups on file.

## 2023-09-02 ENCOUNTER — Ambulatory Visit (INDEPENDENT_AMBULATORY_CARE_PROVIDER_SITE_OTHER): Payer: Medicare Other | Admitting: Nurse Practitioner

## 2023-09-02 ENCOUNTER — Encounter: Payer: Self-pay | Admitting: Nurse Practitioner

## 2023-09-02 VITALS — BP 121/66 | HR 84 | Temp 98.1°F | Ht 71.0 in | Wt 157.4 lb

## 2023-09-02 DIAGNOSIS — G6289 Other specified polyneuropathies: Secondary | ICD-10-CM

## 2023-09-02 DIAGNOSIS — F0280 Dementia in other diseases classified elsewhere without behavioral disturbance: Secondary | ICD-10-CM

## 2023-09-02 DIAGNOSIS — D518 Other vitamin B12 deficiency anemias: Secondary | ICD-10-CM

## 2023-09-02 DIAGNOSIS — I10 Essential (primary) hypertension: Secondary | ICD-10-CM | POA: Diagnosis not present

## 2023-09-02 DIAGNOSIS — R7303 Prediabetes: Secondary | ICD-10-CM

## 2023-09-02 DIAGNOSIS — G309 Alzheimer's disease, unspecified: Secondary | ICD-10-CM | POA: Diagnosis not present

## 2023-09-02 DIAGNOSIS — C61 Malignant neoplasm of prostate: Secondary | ICD-10-CM | POA: Diagnosis not present

## 2023-09-02 MED ORDER — AMLODIPINE BESYLATE 10 MG PO TABS: 10.0000 mg | ORAL_TABLET | Freq: Every day | ORAL | 1 refills | Status: DC

## 2023-09-02 NOTE — Assessment & Plan Note (Signed)
Chronic.  Well controlled.  Labs ordered at visit today.  Will make recommendations based on lab results.

## 2023-09-02 NOTE — Assessment & Plan Note (Signed)
Chronic.  Controlled.  Continue with current medication regimen.  Labs ordered today.  Return to clinic in 6 months for reevaluation.  Call sooner if concerns arise.  ? ?

## 2023-09-02 NOTE — Assessment & Plan Note (Signed)
Chronic. Followed by Urology. Continue to follow their recommendations.

## 2023-09-02 NOTE — Assessment & Plan Note (Signed)
Chronic. Ongoing.  Patient's daughter feels like patient is stable.  Continue with current medication regimen.  Denies concerns at visit today.  Follow up in 3 months.  Call sooner if concerns arise.

## 2023-09-02 NOTE — Assessment & Plan Note (Signed)
Chronic.  Controlled.  Continue with current medication regimen on Amlodipine  and Maxide 75/50mg  daily.  Labs ordered today.  Return to clinic in 3 months for reevaluation.  Call sooner if concerns arise.

## 2023-09-03 LAB — COMPREHENSIVE METABOLIC PANEL
ALT: 20 [IU]/L (ref 0–44)
AST: 19 [IU]/L (ref 0–40)
Albumin: 4.3 g/dL (ref 3.6–4.6)
Alkaline Phosphatase: 66 [IU]/L (ref 44–121)
BUN/Creatinine Ratio: 16 (ref 10–24)
BUN: 21 mg/dL (ref 10–36)
Bilirubin Total: 0.4 mg/dL (ref 0.0–1.2)
CO2: 24 mmol/L (ref 20–29)
Calcium: 9.3 mg/dL (ref 8.6–10.2)
Chloride: 107 mmol/L — ABNORMAL HIGH (ref 96–106)
Creatinine, Ser: 1.3 mg/dL — ABNORMAL HIGH (ref 0.76–1.27)
Globulin, Total: 2 g/dL (ref 1.5–4.5)
Glucose: 129 mg/dL — ABNORMAL HIGH (ref 70–99)
Potassium: 4.1 mmol/L (ref 3.5–5.2)
Sodium: 146 mmol/L — ABNORMAL HIGH (ref 134–144)
Total Protein: 6.3 g/dL (ref 6.0–8.5)
eGFR: 52 mL/min/{1.73_m2} — ABNORMAL LOW (ref >59)

## 2023-09-03 LAB — CBC WITH DIFFERENTIAL/PLATELET
Basophils Absolute: 0 10*3/uL (ref 0.0–0.2)
Basos: 1 %
EOS (ABSOLUTE): 0.1 10*3/uL (ref 0.0–0.4)
Eos: 1 %
Hematocrit: 47.9 % (ref 37.5–51.0)
Hemoglobin: 15 g/dL (ref 13.0–17.7)
Immature Grans (Abs): 0 10*3/uL (ref 0.0–0.1)
Immature Granulocytes: 1 %
Lymphocytes Absolute: 1.4 10*3/uL (ref 0.7–3.1)
Lymphs: 29 %
MCH: 25.5 pg — ABNORMAL LOW (ref 26.6–33.0)
MCHC: 31.3 g/dL — ABNORMAL LOW (ref 31.5–35.7)
MCV: 82 fL (ref 79–97)
Monocytes Absolute: 0.4 10*3/uL (ref 0.1–0.9)
Monocytes: 8 %
Neutrophils Absolute: 2.9 10*3/uL (ref 1.4–7.0)
Neutrophils: 60 %
Platelets: 178 10*3/uL (ref 150–450)
RBC: 5.88 x10E6/uL — ABNORMAL HIGH (ref 4.14–5.80)
RDW: 14.4 % (ref 11.6–15.4)
WBC: 4.8 10*3/uL (ref 3.4–10.8)

## 2023-09-03 LAB — VITAMIN B12: Vitamin B-12: 1194 pg/mL (ref 232–1245)

## 2023-09-03 LAB — HEMOGLOBIN A1C
Est. average glucose Bld gHb Est-mCnc: 134 mg/dL
Hgb A1c MFr Bld: 6.3 % — ABNORMAL HIGH (ref 4.8–5.6)

## 2023-10-28 ENCOUNTER — Encounter: Payer: Self-pay | Admitting: Nurse Practitioner

## 2023-11-02 MED ORDER — MIRTAZAPINE 7.5 MG PO TABS: 7.5000 mg | ORAL_TABLET | Freq: Every day | ORAL | 0 refills | Status: DC

## 2023-11-23 ENCOUNTER — Encounter: Payer: Self-pay | Admitting: Nurse Practitioner

## 2023-11-23 ENCOUNTER — Ambulatory Visit (INDEPENDENT_AMBULATORY_CARE_PROVIDER_SITE_OTHER): Payer: Medicare Other | Admitting: Nurse Practitioner

## 2023-11-23 VITALS — BP 127/71 | HR 76 | Ht 69.69 in | Wt 158.4 lb

## 2023-11-23 DIAGNOSIS — G309 Alzheimer's disease, unspecified: Secondary | ICD-10-CM | POA: Diagnosis not present

## 2023-11-23 DIAGNOSIS — G6289 Other specified polyneuropathies: Secondary | ICD-10-CM | POA: Diagnosis not present

## 2023-11-23 DIAGNOSIS — C61 Malignant neoplasm of prostate: Secondary | ICD-10-CM | POA: Diagnosis not present

## 2023-11-23 DIAGNOSIS — I10 Essential (primary) hypertension: Secondary | ICD-10-CM

## 2023-11-23 DIAGNOSIS — L608 Other nail disorders: Secondary | ICD-10-CM

## 2023-11-23 DIAGNOSIS — D518 Other vitamin B12 deficiency anemias: Secondary | ICD-10-CM

## 2023-11-23 DIAGNOSIS — F0280 Dementia in other diseases classified elsewhere without behavioral disturbance: Secondary | ICD-10-CM

## 2023-11-23 MED ORDER — AMLODIPINE BESYLATE 10 MG PO TABS: 10.0000 mg | ORAL_TABLET | Freq: Every day | ORAL | 1 refills | Status: DC

## 2023-11-23 MED ORDER — TRIAMTERENE-HCTZ 75-50 MG PO TABS: 1.0000 | ORAL_TABLET | Freq: Every day | ORAL | 1 refills | Status: DC

## 2023-11-23 MED ORDER — MIRTAZAPINE 7.5 MG PO TABS: 7.5000 mg | ORAL_TABLET | Freq: Every day | ORAL | 1 refills | Status: DC

## 2023-11-23 MED ORDER — GALANTAMINE HYDROBROMIDE ER 8 MG PO CP24: 8.0000 mg | ORAL_CAPSULE | Freq: Every day | ORAL | 1 refills | Status: DC

## 2023-11-23 MED ORDER — MEMANTINE HCL 10 MG PO TABS: 10.0000 mg | ORAL_TABLET | Freq: Two times a day (BID) | ORAL | 1 refills | Status: DC

## 2023-11-23 NOTE — Assessment & Plan Note (Signed)
Chronic.  Controlled.  Continue with current medication regimen.  Labs ordered today.  Return to clinic in 3 months for reevaluation.  Call sooner if concerns arise.   

## 2023-11-23 NOTE — Progress Notes (Addendum)
BP 127/71 (BP Location: Left Arm, Patient Position: Sitting, Cuff Size: Normal)   Pulse 76   Ht 5' 9.69" (1.77 m)   Wt 158 lb 6.4 oz (71.8 kg)   SpO2 99%   BMI 22.93 kg/m    Subjective:    Patient ID: Ian Parker, male    DOB: 12-Sep-1931, 89 y.o.   MRN: [id number]  HPI: Ian Parker is a 89 y.o. male  Chief Complaint  Patient presents with  . 3 month follow up  . Referral    [facility] or Podiatry for diabetic foot care   . Aphasia   HYPERTENSION Hypertension status: controlled  Satisfied with current treatment? yes Duration of hypertension: years BP monitoring frequency:  not checking BP range:  BP medication side effects:  no Medication compliance: excellent compliance Previous BP meds: amlodipine and Maxide Aspirin: no Recurrent headaches: no Visual changes: no Palpitations: no Dyspnea: no Chest pain: no Lower extremity edema: no Dizzy/lightheaded: no  MEMORY Patient's son and wife accompanied patient today.  His memory has been the same.  He was having some aggressive behavior thought the day.  We started Mirtazipine and that seems to helping with his symptoms.  Doing well without concerns.   Patient's children have noticed that his toenails are long and thick and would like to have them cut tended to.     Relevant past medical, surgical, family and social history reviewed and updated as indicated. Interim medical history since our last visit reviewed. Allergies and medications reviewed and updated.  Review of Systems  Eyes:  Negative for visual disturbance.  Respiratory:  Negative for shortness of breath.   Cardiovascular:  Negative for chest pain and leg swelling.  Skin:        Thick toenails  Neurological:  Negative for light-headedness and headaches.       Memory decline  Psychiatric/Behavioral:  Positive for behavioral problems.     Per HPI unless specifically indicated above     Objective:    BP 127/71 (BP Location: Left Arm,  Patient Position: Sitting, Cuff Size: Normal)   Pulse 76   Ht 5' 9.69" (1.77 m)   Wt 158 lb 6.4 oz (71.8 kg)   SpO2 99%   BMI 22.93 kg/m   Wt Readings from Last 3 Encounters:  11/23/23 158 lb 6.4 oz (71.8 kg)  09/02/23 157 lb 6.4 oz (71.4 kg)  05/27/23 162 lb 6.4 oz (73.7 kg)    Physical Exam Vitals and nursing note reviewed.  Constitutional:      General: He is not in acute distress.    Appearance: Normal appearance. He is not ill-appearing, toxic-appearing or diaphoretic.  HENT:     Head: Normocephalic.     Right Ear: External ear normal.     Left Ear: External ear normal.     Nose: Nose normal. No congestion or rhinorrhea.     Mouth/Throat:     Mouth: Mucous membranes are moist.  Eyes:     General:        Right eye: No discharge.        Left eye: No discharge.     Extraocular Movements: Extraocular movements intact.     Conjunctiva/sclera: Conjunctivae normal.     Pupils: Pupils are equal, round, and reactive to light.  Cardiovascular:     Rate and Rhythm: Normal rate and regular rhythm.     Heart sounds: No murmur heard. Pulmonary:     Effort: Pulmonary effort  is normal. No respiratory distress.     Breath sounds: Normal breath sounds. No wheezing, rhonchi or rales.  Abdominal:     General: Abdomen is flat. Bowel sounds are normal.  Musculoskeletal:     Cervical back: Normal range of motion and neck supple.  Skin:    General: Skin is warm and dry.     Capillary Refill: Capillary refill takes less than 2 seconds.  Neurological:     General: No focal deficit present.     Mental Status: He is alert and oriented to person, place, and time.  Psychiatric:        Mood and Affect: Mood normal.        Behavior: Behavior normal.    Results for orders placed or performed in visit on 09/02/23  Comp Met (CMET)   Collection Time: 09/02/23 11:25 AM  Result Value Ref Range   Glucose 129 (H) 70 - 99 mg/dL   BUN 21 10 - 36 mg/dL   Creatinine, Ser 0.86 (H) 0.76 - 1.27 mg/dL    eGFR 52 (L) >57 QI/ONG/2.95   BUN/Creatinine Ratio 16 10 - 24   Sodium 146 (H) 134 - 144 mmol/L   Potassium 4.1 3.5 - 5.2 mmol/L   Chloride 107 (H) 96 - 106 mmol/L   CO2 24 20 - 29 mmol/L   Calcium 9.3 8.6 - 10.2 mg/dL   Total Protein 6.3 6.0 - 8.5 g/dL   Albumin 4.3 3.6 - 4.6 g/dL   Globulin, Total 2.0 1.5 - 4.5 g/dL   Bilirubin Total 0.4 0.0 - 1.2 mg/dL   Alkaline Phosphatase 66 44 - 121 IU/L   AST 19 0 - 40 IU/L   ALT 20 0 - 44 IU/L  CBC w/Diff   Collection Time: 09/02/23 11:25 AM  Result Value Ref Range   WBC 4.8 3.4 - 10.8 x10E3/uL   RBC 5.88 (H) 4.14 - 5.80 x10E6/uL   Hemoglobin 15.0 13.0 - 17.7 g/dL   Hematocrit 28.4 13.2 - 51.0 %   MCV 82 79 - 97 fL   MCH 25.5 (L) 26.6 - 33.0 pg   MCHC 31.3 (L) 31.5 - 35.7 g/dL   RDW 44.0 10.2 - 72.5 %   Platelets 178 150 - 450 x10E3/uL   Neutrophils 60 Not Estab. %   Lymphs 29 Not Estab. %   Monocytes 8 Not Estab. %   Eos 1 Not Estab. %   Basos 1 Not Estab. %   Neutrophils Absolute 2.9 1.4 - 7.0 x10E3/uL   Lymphocytes Absolute 1.4 0.7 - 3.1 x10E3/uL   Monocytes Absolute 0.4 0.1 - 0.9 x10E3/uL   EOS (ABSOLUTE) 0.1 0.0 - 0.4 x10E3/uL   Basophils Absolute 0.0 0.0 - 0.2 x10E3/uL   Immature Granulocytes 1 Not Estab. %   Immature Grans (Abs) 0.0 0.0 - 0.1 x10E3/uL  B12   Collection Time: 09/02/23 11:25 AM  Result Value Ref Range   Vitamin B-12 1,194 232 - 1,245 pg/mL  HgB A1c   Collection Time: 09/02/23 11:25 AM  Result Value Ref Range   Hgb A1c MFr Bld 6.3 (H) 4.8 - 5.6 %   Est. average glucose Bld gHb Est-mCnc 134 mg/dL      Assessment & Plan:   Problem List Items Addressed This Visit       Cardiovascular and Mediastinum   Benign essential HTN   Chronic.  Controlled.  Continue with current medication regimen on Amlodipine 10mg  and Maxide 75/50mg  daily.  Labs ordered today.  Return to  clinic in 3 months for reevaluation.  Call sooner if concerns arise.       Relevant Medications   amLODipine (NORVASC) 10 MG tablet    triamterene-hydrochlorothiazide (MAXZIDE) 75-50 MG tablet   Other Relevant Orders   Comp Met (CMET)     Nervous and Auditory   Peripheral neuropathy (HCC)   Chronic.  Controlled.  Continue with current medication regimen.  Labs ordered today.  Return to clinic in 3 months for reevaluation.  B12 levels rechecked today. Call sooner if concerns arise.        Relevant Medications   galantamine (RAZADYNE ER) 8 MG 24 hr capsule   memantine (NAMENDA) 10 MG tablet   mirtazapine (REMERON) 7.5 MG tablet   Alzheimer's dementia without behavioral disturbance (HCC)   Chronic. Ongoing.  Aggressive symptoms seem to be better controlled since starting the Mirtazipine.   Continue with current medication regimen, can increase if needed in the future.  Follow up in 3 months.  Call sooner if concerns arise.      Relevant Medications   galantamine (RAZADYNE ER) 8 MG 24 hr capsule   memantine (NAMENDA) 10 MG tablet   mirtazapine (REMERON) 7.5 MG tablet     Genitourinary   Prostate cancer (HCC) - Primary   Chronic. Followed by Urology. Continue to follow their recommendations.        Other   Anemia due to vitamin B12 deficiency   Chronic.  Controlled.  Continue with current medication regimen.  Labs ordered today.  Return to clinic in 3 months for reevaluation.  Call sooner if concerns arise.        Relevant Orders   CBC w/Diff   B12   Other Visit Diagnoses       Toenail deformity       Relevant Orders   Ambulatory referral to Podiatry        Follow up plan: Return in about 3 months (around 02/21/2024) for HTN, HLD, DM2 FU.

## 2023-11-23 NOTE — Assessment & Plan Note (Signed)
Chronic.  Controlled.  Continue with current medication regimen.  Labs ordered today.  Return to clinic in 3 months for reevaluation.  B12 levels rechecked today. Call sooner if concerns arise.

## 2023-11-23 NOTE — Assessment & Plan Note (Signed)
Chronic. Followed by Urology. Continue to follow their recommendations.

## 2023-11-23 NOTE — Assessment & Plan Note (Signed)
Chronic.  Controlled.  Continue with current medication regimen on Amlodipine  and Maxide 75/50mg  daily.  Labs ordered today.  Return to clinic in 3 months for reevaluation.  Call sooner if concerns arise.

## 2023-11-23 NOTE — Assessment & Plan Note (Signed)
Chronic. Ongoing.  Aggressive symptoms seem to be better controlled since starting the Mirtazipine.   Continue with current medication regimen, can increase if needed in the future.  Follow up in 3 months.  Call sooner if concerns arise.

## 2023-11-24 ENCOUNTER — Encounter: Payer: Self-pay | Admitting: Nurse Practitioner

## 2023-11-24 LAB — COMPREHENSIVE METABOLIC PANEL
ALT: 26 [IU]/L (ref 0–44)
AST: 25 [IU]/L (ref 0–40)
Albumin: 4.4 g/dL (ref 3.6–4.6)
Alkaline Phosphatase: 70 [IU]/L (ref 44–121)
BUN/Creatinine Ratio: 13 (ref 10–24)
BUN: 14 mg/dL (ref 10–36)
Bilirubin Total: 0.4 mg/dL (ref 0.0–1.2)
CO2: 27 mmol/L (ref 20–29)
Calcium: 9.9 mg/dL (ref 8.6–10.2)
Chloride: 102 mmol/L (ref 96–106)
Creatinine, Ser: 1.1 mg/dL (ref 0.76–1.27)
Globulin, Total: 2.4 g/dL (ref 1.5–4.5)
Glucose: 121 mg/dL — ABNORMAL HIGH (ref 70–99)
Potassium: 4.4 mmol/L (ref 3.5–5.2)
Sodium: 145 mmol/L — ABNORMAL HIGH (ref 134–144)
Total Protein: 6.8 g/dL (ref 6.0–8.5)
eGFR: 64 mL/min/{1.73_m2} (ref >59)

## 2023-11-24 LAB — CBC WITH DIFFERENTIAL/PLATELET
Basophils Absolute: 0 10*3/uL (ref 0.0–0.2)
Basos: 1 %
EOS (ABSOLUTE): 0.2 10*3/uL (ref 0.0–0.4)
Eos: 3 %
Hematocrit: 49.4 % (ref 37.5–51.0)
Hemoglobin: 15.8 g/dL (ref 13.0–17.7)
Immature Grans (Abs): 0 10*3/uL (ref 0.0–0.1)
Immature Granulocytes: 0 %
Lymphocytes Absolute: 1.5 10*3/uL (ref 0.7–3.1)
Lymphs: 25 %
MCH: 26.3 pg — ABNORMAL LOW (ref 26.6–33.0)
MCHC: 32 g/dL (ref 31.5–35.7)
MCV: 82 fL (ref 79–97)
Monocytes Absolute: 0.6 10*3/uL (ref 0.1–0.9)
Monocytes: 10 %
Neutrophils Absolute: 3.6 10*3/uL (ref 1.4–7.0)
Neutrophils: 61 %
Platelets: 169 10*3/uL (ref 150–450)
RBC: 6.01 x10E6/uL — ABNORMAL HIGH (ref 4.14–5.80)
RDW: 14.6 % (ref 11.6–15.4)
WBC: 5.8 10*3/uL (ref 3.4–10.8)

## 2023-11-24 LAB — VITAMIN B12: Vitamin B-12: 1495 pg/mL — ABNORMAL HIGH (ref 232–1245)

## 2023-12-07 NOTE — Progress Notes (Signed)
 Chief Complaint:   Chief Complaint  Patient presents with  . Follow-up    Subjective:   Ian Parker is a 89 y.o. male in today for follow-up of his chronic medical problems.  Current Outpatient Medications  Medication Sig Dispense Refill  . cholecalciferol  (VITAMIN D3) 1000 unit tablet Take by mouth    . cyanocobalamin  2000 MCG tablet Take 2,000 mcg by mouth once daily       . galantamine  (RAZADYNE  ER) 8 MG ER capsule TAKE 1 CAPSULE BY MOUTH ONCE DAILY WITH BREAKFAST 30 capsule 0  . memantine  (NAMENDA ) 10 MG tablet Take 1 tablet by mouth twice daily 180 tablet 0  . mirtazapine  (REMERON ) 7.5 MG tablet Take 7.5 mg by mouth at bedtime    . miscellaneous medical supply Misc Blood pressure monitor for high blood pressure 1 each 0  . nystatin  (MYCOSTATIN ) 100,000 unit/gram cream Apply topically 2 (two) times daily 30 g 1  . potassium chloride  (KLOR-CON ) 20 MEQ ER tablet Take 1 tablet (20 mEq total) by mouth 2 (two) times daily 180 tablet 1  . triamterene -hydrochlorothiazide  (MAXZIDE ) 75-50 mg tablet Take 1 tablet by mouth once daily 90 tablet 0  . amLODIPine  (NORVASC ) 10 MG tablet Take 1 tablet (10 mg total) by mouth once daily 30 tablet 11   Current Facility-Administered Medications  Medication Dose Route Frequency Provider Last Rate Last Admin  . cyanocobalamin  (VITAMIN B12) injection 1,000 mcg  1,000 mcg Intramuscular Q30 Days Vannie Rush Barrett III   1,000 mcg at 06/11/15 9072    Allergies as of 12/07/2023 - Reviewed 12/07/2023  Allergen Reaction Noted  . Peanut Anaphylaxis   . Ace inhibitors Unknown   . Penicillins Other (See Comments) 04/30/2015  . Sulfa (sulfonamide antibiotics) Unknown     Past Medical History:  Diagnosis Date  . Benign esophageal stricture 03/14/2015  . Diverticulitis of colon (without mention of hemorrhage)(562.11)   . Esophageal reflux   . Gastric ulcer   . Hx of adenomatous colonic polyps 2004  . Internal hemorrhoids with other complication      Past Surgical History:  Procedure Laterality Date  . EGD  03/14/2015   Benign esophageal stricture/No Repeat/PYO  . PROSTATE CRYOABLATION    . THYROIDECTOMY TOTAL     Subtotal     Family History  Problem Relation Name Age of Onset  . Myocardial Infarction (Heart attack) Mother    . Brain cancer Father    . Myocardial Infarction (Heart attack) Sister    . Prostate cancer Brother      Social History:  reports that he has quit smoking. He has never used smokeless tobacco. He reports that he does not drink alcohol and does not use drugs.  Results for orders placed or performed in visit on 12/02/23  Basic Metabolic Panel (BMP)  Result Value Ref Range   Glucose 109 70 - 110 mg/dL   Sodium 857 863 - 854 mmol/L   Potassium 3.8 3.6 - 5.1 mmol/L   Chloride 103 97 - 109 mmol/L   Carbon Dioxide (CO2) 30.7 22.0 - 32.0 mmol/L   Calcium 9.3 8.7 - 10.3 mg/dL   Urea Nitrogen (BUN) 16 7 - 25 mg/dL   Creatinine 1.1 0.7 - 1.3 mg/dL   Glomerular Filtration Rate (eGFR) 64 >60 mL/min/1.73sq m   BUN/Crea Ratio 14.5 6.0 - 20.0   Anion Gap w/K 12.1 6.0 - 16.0  CBC w/auto Differential (5 Part)  Result Value Ref Range   WBC (White Blood Cell Count) 5.2  4.1 - 10.2 10^3/uL   RBC (Red Blood Cell Count) 5.88 4.69 - 6.13 10^6/uL   Hemoglobin 15.7 14.1 - 18.1 gm/dL   Hematocrit 51.9 59.9 - 52.0 %   MCV (Mean Corpuscular Volume) 81.6 80.0 - 100.0 fl   MCH (Mean Corpuscular Hemoglobin) 26.7 (L) 27.0 - 31.2 pg   MCHC (Mean Corpuscular Hemoglobin Concentration) 32.7 32.0 - 36.0 gm/dL   Platelet Count 820 849 - 450 10^3/uL   RDW-CV (Red Cell Distribution Width) 14.2 11.6 - 14.8 %   MPV (Mean Platelet Volume) 10.7 9.4 - 12.4 fl   Neutrophils 2.53 1.50 - 7.80 10^3/uL   Lymphocytes 1.98 1.00 - 3.60 10^3/uL   Monocytes 0.49 0.00 - 1.50 10^3/uL   Eosinophils 0.16 0.00 - 0.55 10^3/uL   Basophils 0.03 0.00 - 0.09 10^3/uL   Neutrophil % 48.6 32.0 - 70.0 %   Lymphocyte % 38.1 10.0 - 50.0 %   Monocyte % 9.4  4.0 - 13.0 %   Eosinophil % 3.1 1.0 - 5.0 %   Basophil% 0.6 0.0 - 2.0 %   Immature Granulocyte % 0.2 <=0.7 %   Immature Granulocyte Count 0.01 <=0.06 10^3/L      ROS:  General: No fever, chills or recent illness. No change in weight Skin:   No skin lesions, growths, masses, rashes, pruritus  HEENT: No change in vision or hearing. No pain or difficulty with swallowing Respiratory: No cough or shortness of breath CV:  No chest pain or palpitations GI:  No pain, dyspepsia or change in bowel habits GU:  No dysuria, frequency, or hesitancy MSK:  No joint pain or injury Neurological: Positive for short-term memory loss Objective:   Body mass index is 23.1 kg/m.  BP 120/70   Pulse 77   Ht 177.8 cm (5' 10)   Wt 73 kg (161 lb)   SpO2 96%   BMI 23.10 kg/m   General: WD/WN male, in no acute distress HEENT: Pupils equal and round, EOMI. oral mucosa moist.  Oropharynx clear. Neck: supple, trachea midline; no thyromegaly Respiratory:clear to auscultation.  No dullness to percussion.  No use of accessory muscles. Cardiac:  Regular rate and rhythm without murmur, gallops, or rubs    Assessment/Plan:   Essential hypertension, benign  (primary encounter diagnosis) Anemia due to vitamin B12 deficiency, unspecified B12 deficiency type Alzheimer's dementia without behavioral disturbance (CMS/HHS-HCC) Polyneuropathy associated with underlying disease (CMS/HHS-HCC) Benign prostatic hyperplasia with lower urinary tract symptoms, symptom details unspecified  Assessment and Plan  1.  Hypertension.  Well-controlled with current medication. 2.  B12 anemia.  Continue B12 supplement. 3.  Dementia.  Continue Namenda . 4.  Neuropathy.  Continue current treatment.    Goals     . * Eat more fruits and vegetables (pt-stated)      Eat healthier, reduce salt intake, and take my medications properly.     . * Maintain health/healthy lifestyle (pt-stated)    . * Maintain health/healthy  lifestyle (pt-stated)        NORLEEN ALM ROWER, MD  Portions of this note were created using dictation software and may contain typographical errors.  *Some images could not be shown.

## 2024-01-03 ENCOUNTER — Ambulatory Visit (INDEPENDENT_AMBULATORY_CARE_PROVIDER_SITE_OTHER): Payer: Medicare Other | Admitting: Podiatry

## 2024-01-03 ENCOUNTER — Encounter: Payer: Self-pay | Admitting: Podiatry

## 2024-01-03 VITALS — Ht 69.0 in | Wt 158.4 lb

## 2024-01-03 DIAGNOSIS — B351 Tinea unguium: Secondary | ICD-10-CM | POA: Diagnosis not present

## 2024-01-03 DIAGNOSIS — M79674 Pain in right toe(s): Secondary | ICD-10-CM | POA: Diagnosis not present

## 2024-01-03 DIAGNOSIS — M2011 Hallux valgus (acquired), right foot: Secondary | ICD-10-CM | POA: Diagnosis not present

## 2024-01-03 DIAGNOSIS — M79675 Pain in left toe(s): Secondary | ICD-10-CM

## 2024-01-03 DIAGNOSIS — M2012 Hallux valgus (acquired), left foot: Secondary | ICD-10-CM

## 2024-01-03 DIAGNOSIS — G6289 Other specified polyneuropathies: Secondary | ICD-10-CM | POA: Diagnosis not present

## 2024-01-06 ENCOUNTER — Encounter: Payer: Self-pay | Admitting: Podiatry

## 2024-01-06 NOTE — Progress Notes (Signed)
 Subjective: Ian Parker presents today referred by Larae Grooms, NP for complaint of with chief concern of diabetes with elongated, thickened, painful, discolored toenails for months. Aggravating factor(s) include wearing enclosed shoe gear. He also has h/o polyneuropathy. Patient has tried attempted trimming by wife who is unable to complete job on his great toes. He has h/o Alzheimer's and is accompanied by his wife and his son on today's visit. Past Medical History:  Diagnosis Date   Abnormal EKG    Abnormal PSA    BPH (benign prostatic hyperplasia)    Dysuria    Erectile dysfunction    Gastric ulcer    Goiter    Hemorrhoid    Inflammatory disease of prostate    Irritation of perirectal skin    Prostate cancer (HCC)    Rectal burning    Skin lesion      Patient Active Problem List   Diagnosis Date Noted   Altered mental status 08/07/2019   Alzheimer's dementia without behavioral disturbance (HCC) 03/30/2018   Anemia due to vitamin B12 deficiency 07/12/2015   Prostate cancer (HCC) 06/26/2015   ED (erectile dysfunction) of organic origin 06/26/2015   Hemorrhoids 06/26/2015   Benign prostatic hyperplasia with lower urinary tract symptoms 06/26/2015   H/O nutritional disorder 08/21/2014   Peripheral neuropathy (HCC) 08/21/2014   H/O malignant neoplasm of prostate 08/21/2014   Benign essential HTN 04/25/2014     Past Surgical History:  Procedure Laterality Date   cryoablation of prostate  02/23/2006   ESOPHAGOGASTRODUODENOSCOPY N/A 03/14/2015   Procedure: ESOPHAGOGASTRODUODENOSCOPY (EGD);  Surgeon: Wallace Cullens, MD;  Location: [facility] ENDOSCOPY;  Service: Gastroenterology;  Laterality: N/A;   THYROIDECTOMY     Subtotal     Current Outpatient Medications on File Prior to Visit  Medication Sig Dispense Refill   amLODipine (NORVASC) 10 MG tablet Take 1 tablet (10 mg total) by mouth daily. 90 tablet 1   cholecalciferol (VITAMIN D) 1000 UNITS tablet Take 1,000 Units by  mouth daily. Two tablets once daily     galantamine (RAZADYNE ER) 8 MG 24 hr capsule Take 1 capsule (8 mg total) by mouth daily with breakfast. 90 capsule 1   loratadine (CLARITIN) 10 MG tablet Take 10 mg by mouth daily.     memantine (NAMENDA) 10 MG tablet Take 1 tablet (10 mg total) by mouth 2 (two) times daily. 180 tablet 1   mirtazapine (REMERON) 7.5 MG tablet Take 1 tablet (7.5 mg total) by mouth at bedtime. 90 tablet 1   potassium chloride SA (KLOR-CON M20) 20 MEQ tablet Take 2 tablets by mouth twice daily 360 tablet 3   triamterene-hydrochlorothiazide (MAXZIDE) 75-50 MG tablet Take 1 tablet by mouth daily. 90 tablet 1   vitamin B-12 (CYANOCOBALAMIN) 1000 MCG tablet Take 1,000 mcg by mouth daily.     No current facility-administered medications on file prior to visit.     Allergies  Allergen Reactions   Ace Inhibitors    Peanuts [Peanut Oil]    Penicillins    Sulfa Antibiotics      Social History   Occupational History   Not on file  Tobacco Use   Smoking status: Never   Smokeless tobacco: Never  Vaping Use   Vaping status: Never Used  Substance and Sexual Activity   Alcohol use: No    Alcohol/week: 0.0 standard drinks of alcohol   Drug use: No   Sexual activity: Not Currently     Family History  Problem Relation Age of Onset  Heart attack Mother    Brain cancer Father        tumor unknown if cancer   Diabetes Sister    Hypertension Sister    Hyperlipidemia Sister    Heart attack Sister    Prostate cancer Brother    Kidney disease Neg Hx    Kidney cancer Neg Hx    Bladder Cancer Neg Hx      Immunization History  Administered Date(s) Administered   Moderna Sars-Covid-2 Vaccination 12/15/2019, 01/12/2020, 08/28/2020   PNEUMOCOCCAL CONJUGATE-20 08/24/2022     Objective: There were no vitals filed for this visit.  Ian Parker is a pleasant 89 y.o. male WD, WN in NAD. AAO x 3.  Vascular Examination: CFT <3 seconds b/l. DP/PT pulses faintly palpable  b/l. Skin temperature gradient warm to warm b/l. No pain with calf compression. No ischemia or gangrene. No cyanosis or clubbing noted b/l. No edema noted b/l LE. No ischemia or gangrene noted b/l LE. No cyanosis or clubbing noted b/l LE.   Neurological Examination: Sensation grossly intact b/l with 10 gram monofilament. Vibratory sensation intact b/l. Pt has subjective symptoms of neuropathy.  Dermatological Examination: Pedal skin warm and supple b/l.   No open wounds. No interdigital macerations.  Toenails 1-5 b/l thick, discolored, elongated with subungual debris and pain on dorsal palpation.    No corns, calluses nor porokeratotic lesions noted.  Musculoskeletal Examination: Muscle strength 5/5 to all lower extremity muscle groups bilaterally. HAV with bunion deformity noted b/l LE. Patient ambulates independent of any assistive aids.  Radiographs: None  Last A1c:      Latest Ref Rng & Units 09/02/2023   11:25 AM 05/27/2023   11:26 AM  Hemoglobin A1C  Hemoglobin-A1c 4.8 - 5.6 % 6.3  5.9    Radiographs: None  Assessment: 1. Pain due to onychomycosis of toenails of both feet   2. Hallux valgus, acquired, bilateral   3. Other polyneuropathy     Plan: -Patient with h/o dementia/Alzheimer's/cognitive deficit. Patient's family member present. All questions/concerns addressed on today's visit. -Patient to continue soft, supportive shoe gear daily. -Mycotic toenails 1-5 bilaterally were debrided in length and girth with sterile nail nippers and dremel without incident. -Patient/POA to call should there be question/concern in the interim.  Return in about 3 months (around 04/04/2024).  Ian Parker, DPM      [place] LOCATION: [address] [place] [address]                   Office [phone]   [facility] LOCATION: [address] [place], [place] [address] Office [phone]

## 2024-02-17 ENCOUNTER — Emergency Department

## 2024-02-17 ENCOUNTER — Emergency Department
Admission: EM | Admit: 2024-02-17 | Discharge: 2024-02-17 | Disposition: A | Discharge status: Home / Self Care | Attending: Emergency Medicine | Admitting: Emergency Medicine

## 2024-02-17 ENCOUNTER — Other Ambulatory Visit: Payer: Self-pay

## 2024-02-17 DIAGNOSIS — E860 Dehydration: Secondary | ICD-10-CM

## 2024-02-17 DIAGNOSIS — R531 Weakness: Secondary | ICD-10-CM

## 2024-02-17 DIAGNOSIS — R1031 Right lower quadrant pain: Secondary | ICD-10-CM | POA: Insufficient documentation

## 2024-02-17 DIAGNOSIS — R4182 Altered mental status, unspecified: Secondary | ICD-10-CM | POA: Diagnosis present

## 2024-02-17 DIAGNOSIS — F0390 Unspecified dementia without behavioral disturbance: Secondary | ICD-10-CM | POA: Diagnosis not present

## 2024-02-17 LAB — URINALYSIS, ROUTINE W REFLEX MICROSCOPIC
Bilirubin Urine: NEGATIVE
Glucose, UA: NEGATIVE mg/dL
Hgb urine dipstick: NEGATIVE
Ketones, ur: NEGATIVE mg/dL
Leukocytes,Ua: NEGATIVE
Nitrite: NEGATIVE
Protein, ur: NEGATIVE mg/dL
Specific Gravity, Urine: 1.009 (ref 1.005–1.030)
pH: 6 (ref 5.0–8.0)

## 2024-02-17 LAB — LIPASE, BLOOD: Lipase: 53 U/L — ABNORMAL HIGH (ref 11–51)

## 2024-02-17 LAB — COMPREHENSIVE METABOLIC PANEL WITH GFR
ALT: 24 U/L (ref 0–44)
AST: 25 U/L (ref 15–41)
Albumin: 4.4 g/dL (ref 3.5–5.0)
Alkaline Phosphatase: 52 U/L (ref 38–126)
Anion gap: 10 (ref 5–15)
BUN: 18 mg/dL (ref 8–23)
CO2: 25 mmol/L (ref 22–32)
Calcium: 9.1 mg/dL (ref 8.9–10.3)
Chloride: 104 mmol/L (ref 98–111)
Creatinine, Ser: 1.29 mg/dL — ABNORMAL HIGH (ref 0.61–1.24)
GFR, Estimated: 53 mL/min — ABNORMAL LOW (ref >60)
Glucose, Bld: 138 mg/dL — ABNORMAL HIGH (ref 70–99)
Potassium: 3.5 mmol/L (ref 3.5–5.1)
Sodium: 139 mmol/L (ref 135–145)
Total Bilirubin: 0.9 mg/dL (ref 0.0–1.2)
Total Protein: 7.4 g/dL (ref 6.5–8.1)

## 2024-02-17 LAB — CBC
HCT: 51.3 % (ref 39.0–52.0)
Hemoglobin: 16.5 g/dL (ref 13.0–17.0)
MCH: 25.9 pg — ABNORMAL LOW (ref 26.0–34.0)
MCHC: 32.2 g/dL (ref 30.0–36.0)
MCV: 80.4 fL (ref 80.0–100.0)
Platelets: 151 10*3/uL (ref 150–400)
RBC: 6.38 MIL/uL — ABNORMAL HIGH (ref 4.22–5.81)
RDW: 14.4 % (ref 11.5–15.5)
WBC: 3.8 10*3/uL — ABNORMAL LOW (ref 4.0–10.5)
nRBC: 0 % (ref 0.0–0.2)

## 2024-02-17 LAB — TROPONIN I (HIGH SENSITIVITY): Troponin I (High Sensitivity): 5 ng/L (ref <18)

## 2024-02-17 MED ORDER — IOHEXOL 300 MG/ML  SOLN
75.0000 mL | Freq: Once | INTRAMUSCULAR | Status: AC | PRN
  Administered 2024-02-17: 75 mL via INTRAVENOUS

## 2024-02-17 MED ORDER — SODIUM CHLORIDE 0.9 % IV BOLUS
500.0000 mL | Freq: Once | INTRAVENOUS | Status: AC
  Administered 2024-02-17: 500 mL via INTRAVENOUS

## 2024-02-17 NOTE — ED Triage Notes (Signed)
 Pt arrives with family with very vague symptoms. Pt says "I just don't feel good." He does not clarify what symptoms he is having. Per wife, she says he has been forgetful "off and on for a while." Pt and wife both deny any slurred speech or focal weakness. Wife denies any falls or injury. Pt oriented to self only at this time. Wife says this happens sometimes where he can't recall things.

## 2024-02-17 NOTE — ED Provider Notes (Signed)
 [facility] Provider Note    Event Date/Time   First MD Initiated Contact with Patient 02/17/24 [phone]     (approximate)   History   Altered Mental Status   HPI  Ian Parker is a 89 y.o. male with a history of dementia who presents with complaints of not feeling good.  Patient apparently told his family he was not feeling well this morning, he is unable to tell exactly what that means.  Denies chest pain.  Does report some right lower quadrant abdominal discomfort on exam.  No headache, no neurodeficits.  No fevers reported     Physical Exam   Triage Vital Signs: ED Triage Vitals  Encounter Vitals Group     BP 02/17/24 0844 135/70     Systolic BP Percentile --      Diastolic BP Percentile --      Pulse Rate 02/17/24 0844 74     Resp 02/17/24 0844 17     Temp 02/17/24 0845 97.6 F (36.4 C)     Temp Source 02/17/24 0845 Oral     SpO2 02/17/24 0844 100 %     Weight --      Height --      Head Circumference --      Peak Flow --      Pain Score 02/17/24 0850 0     Pain Loc --      Pain Education --      Exclude from Growth Chart --     Most recent vital signs: Vitals:   02/17/24 1340 02/17/24 1400  BP:  137/72  Pulse: 69 (!) 59  Resp: 18 (!) 21  Temp:    SpO2:       General: Awake, no distress.  CV:  Good peripheral perfusion.  Resp:  Normal effort.  Clear to auscultation bilaterally Abd:  No distention.  Mild tenderness in the right lower quadrant Other:  No CVA tenderness   ED Results / Procedures / Treatments   Labs (all labs ordered are listed, but only abnormal results are displayed) Labs Reviewed  CBC - Abnormal; Notable for the following components:      Result Value   WBC 3.8 (*)    RBC 6.38 (*)    MCH 25.9 (*)    All other components within normal limits  COMPREHENSIVE METABOLIC PANEL WITH GFR - Abnormal; Notable for the following components:   Glucose, Bld 138 (*)    Creatinine, Ser 1.29 (*)    GFR, Estimated  53 (*)    All other components within normal limits  LIPASE, BLOOD - Abnormal; Notable for the following components:   Lipase 53 (*)    All other components within normal limits  URINALYSIS, ROUTINE W REFLEX MICROSCOPIC - Abnormal; Notable for the following components:   Color, Urine STRAW (*)    APPearance CLEAR (*)    All other components within normal limits  TROPONIN I (HIGH SENSITIVITY)     EKG  ED ECG REPORT I, Jene Every, the attending physician, personally viewed and interpreted this ECG.  Date: 02/17/2024  Rhythm: normal sinus rhythm QRS Axis: normal Intervals: normal ST/T Wave abnormalities: normal Narrative Interpretation: no evidence of acute ischemia    RADIOLOGY CT abdomen pelvis viewed interpret by me, no evidence of appendicitis    PROCEDURES:  Critical Care performed:   Procedures   MEDICATIONS ORDERED IN ED: Medications  sodium chloride 0.9 % bolus 500 mL (0 mLs Intravenous Stopped  02/17/24 1417)  iohexol (OMNIPAQUE) 300 MG/ML solution 75 mL (75 mLs Intravenous Contrast Given 02/17/24 1112)     IMPRESSION / MDM / ASSESSMENT AND PLAN / ED COURSE  I reviewed the triage vital signs and the nursing notes. Patient's presentation is most consistent with acute presentation with potential threat to life or bodily function.  Patient presents with complaints of generalized weakness.  His vitals are reassuring, no neurodeficits to suggest CVA, no chest pain to suggest ACS.  Will check for signs of infection, electrolyte abnormalities, intra-abdominal infection, sent for CT given right lower quadrant abdominal pain  Lab work, CT scan reviewed and are reassuring, no acute abnormalities on CT  Patient is feeling much better after IV fluids and states he is "ready to go ".  Recommend close follow-up with PCP, return precautions discussed, patient and family agree with this plan        FINAL CLINICAL IMPRESSION(S) / ED DIAGNOSES   Final diagnoses:   Dehydration  Generalized weakness     Rx / DC Orders   ED Discharge Orders     None        Note:  This document was prepared using Dragon voice recognition software and may include unintentional dictation errors.   Bryson Carbine, MD 02/17/24 [phone]

## 2024-02-21 ENCOUNTER — Telehealth: Payer: Self-pay | Admitting: Nurse Practitioner

## 2024-02-21 NOTE — Telephone Encounter (Signed)
 No notes in chart or in labs to reference. Routing to clinic.  Copied from CRM [id number]. Topic: Clinical - Lab/Test Results >> Feb 21, 2024  9:24 AM Baldemar Lev wrote: Reason for CRM: Pt;s wife called returning a missed call from this morning. Believes it is regarding lab results. No notes from PCP   Best contact: [phone]

## 2024-02-21 NOTE — Telephone Encounter (Signed)
 Reviewed patient's chart, no documentation that patient was called. Tried calling patient, LVM notifying patient of the above and to please call us  with any questions or concerns.

## 2024-02-28 ENCOUNTER — Ambulatory Visit: Payer: Self-pay | Admitting: Nurse Practitioner

## 2024-02-28 DIAGNOSIS — R7303 Prediabetes: Secondary | ICD-10-CM | POA: Insufficient documentation

## 2024-02-28 NOTE — Progress Notes (Deleted)
 There were no vitals taken for this visit.   Subjective:    Patient ID: Ian Parker, male    DOB: 1931-10-09, 89 y.o.   MRN: [id number]  HPI: Ian Parker is a 89 y.o. male  No chief complaint on file.  HYPERTENSION Hypertension status: controlled  Satisfied with current treatment? yes Duration of hypertension: years BP monitoring frequency:  not checking BP range:  BP medication side effects:  no Medication compliance: excellent compliance Previous BP meds: amlodipine  and Maxide Aspirin : no Recurrent headaches: no Visual changes: no Palpitations: no Dyspnea: no Chest pain: no Lower extremity edema: no Dizzy/lightheaded: no  MEMORY Patient's son and wife accompanied patient today.  His memory has been the same.  He was having some aggressive behavior thought the day.  We started Mirtazipine and that seems to helping with his symptoms.  Doing well without concerns.   Patient's children have noticed that his toenails are long and thick and would like to have them cut tended to.     Relevant past medical, surgical, family and social history reviewed and updated as indicated. Interim medical history since our last visit reviewed. Allergies and medications reviewed and updated.  Review of Systems  Eyes:  Negative for visual disturbance.  Respiratory:  Negative for shortness of breath.   Cardiovascular:  Negative for chest pain and leg swelling.  Skin:        Thick toenails  Neurological:  Negative for light-headedness and headaches.       Memory decline  Psychiatric/Behavioral:  Positive for behavioral problems.     Per HPI unless specifically indicated above     Objective:    There were no vitals taken for this visit.  Wt Readings from Last 3 Encounters:  02/17/24 168 lb 3.4 oz (76.3 kg)  01/03/24 158 lb 6.4 oz (71.8 kg)  11/23/23 158 lb 6.4 oz (71.8 kg)    Physical Exam Vitals and nursing note reviewed.  Constitutional:      General: He is not in  acute distress.    Appearance: Normal appearance. He is not ill-appearing, toxic-appearing or diaphoretic.  HENT:     Head: Normocephalic.     Right Ear: External ear normal.     Left Ear: External ear normal.     Nose: Nose normal. No congestion or rhinorrhea.     Mouth/Throat:     Mouth: Mucous membranes are moist.  Eyes:     General:        Right eye: No discharge.        Left eye: No discharge.     Extraocular Movements: Extraocular movements intact.     Conjunctiva/sclera: Conjunctivae normal.     Pupils: Pupils are equal, round, and reactive to light.  Cardiovascular:     Rate and Rhythm: Normal rate and regular rhythm.     Heart sounds: No murmur heard. Pulmonary:     Effort: Pulmonary effort is normal. No respiratory distress.     Breath sounds: Normal breath sounds. No wheezing, rhonchi or rales.  Abdominal:     General: Abdomen is flat. Bowel sounds are normal.  Musculoskeletal:     Cervical back: Normal range of motion and neck supple.  Skin:    General: Skin is warm and dry.     Capillary Refill: Capillary refill takes less than 2 seconds.  Neurological:     General: No focal deficit present.     Mental Status: He is alert and oriented to person,  place, and time.  Psychiatric:        Mood and Affect: Mood normal.        Behavior: Behavior normal.    Results for orders placed or performed during the hospital encounter of 02/17/24  CBC   Collection Time: 02/17/24  9:28 AM  Result Value Ref Range   WBC 3.8 (L) 4.0 - 10.5 K/uL   RBC 6.38 (H) 4.22 - 5.81 MIL/uL   Hemoglobin 16.5 13.0 - 17.0 g/dL   HCT 45.4 09.8 - 11.9 %   MCV 80.4 80.0 - 100.0 fL   MCH 25.9 (L) 26.0 - 34.0 pg   MCHC 32.2 30.0 - 36.0 g/dL   RDW 14.7 82.9 - 56.2 %   Platelets 151 150 - 400 K/uL   nRBC 0.0 0.0 - 0.2 %  Comprehensive metabolic panel   Collection Time: 02/17/24  9:28 AM  Result Value Ref Range   Sodium 139 135 - 145 mmol/L   Potassium 3.5 3.5 - 5.1 mmol/L   Chloride 104 98 -  111 mmol/L   CO2 25 22 - 32 mmol/L   Glucose, Bld 138 (H) 70 - 99 mg/dL   BUN 18 8 - 23 mg/dL   Creatinine, Ser 1.30 (H) 0.61 - 1.24 mg/dL   Calcium 9.1 8.9 - 86.5 mg/dL   Total Protein 7.4 6.5 - 8.1 g/dL   Albumin 4.4 3.5 - 5.0 g/dL   AST 25 15 - 41 U/L   ALT 24 0 - 44 U/L   Alkaline Phosphatase 52 38 - 126 U/L   Total Bilirubin 0.9 0.0 - 1.2 mg/dL   GFR, Estimated 53 (L) >60 mL/min   Anion gap 10 5 - 15  Lipase, blood   Collection Time: 02/17/24  9:28 AM  Result Value Ref Range   Lipase 53 (H) 11 - 51 U/L  Troponin I (High Sensitivity)   Collection Time: 02/17/24  9:28 AM  Result Value Ref Range   Troponin I (High Sensitivity) 5 <18 ng/L  Urinalysis, Routine w reflex microscopic -Urine, Clean Catch   Collection Time: 02/17/24 11:55 AM  Result Value Ref Range   Color, Urine STRAW (A) YELLOW   APPearance CLEAR (A) CLEAR   Specific Gravity, Urine 1.009 1.005 - 1.030   pH 6.0 5.0 - 8.0   Glucose, UA NEGATIVE NEGATIVE mg/dL   Hgb urine dipstick NEGATIVE NEGATIVE   Bilirubin Urine NEGATIVE NEGATIVE   Ketones, ur NEGATIVE NEGATIVE mg/dL   Protein, ur NEGATIVE NEGATIVE mg/dL   Nitrite NEGATIVE NEGATIVE   Leukocytes,Ua NEGATIVE NEGATIVE      Assessment & Plan:   Problem List Items Addressed This Visit       Cardiovascular and Mediastinum   Benign essential HTN     Nervous and Auditory   Alzheimer's dementia without behavioral disturbance (HCC) - Primary     Other   Prediabetes     Follow up plan: No follow-ups on file.

## 2024-04-06 ENCOUNTER — Encounter: Payer: Self-pay | Admitting: Podiatry

## 2024-04-06 ENCOUNTER — Ambulatory Visit (INDEPENDENT_AMBULATORY_CARE_PROVIDER_SITE_OTHER): Admitting: Podiatry

## 2024-04-06 DIAGNOSIS — B351 Tinea unguium: Secondary | ICD-10-CM | POA: Diagnosis not present

## 2024-04-06 DIAGNOSIS — M79675 Pain in left toe(s): Secondary | ICD-10-CM | POA: Diagnosis not present

## 2024-04-06 DIAGNOSIS — G6289 Other specified polyneuropathies: Secondary | ICD-10-CM | POA: Diagnosis not present

## 2024-04-06 DIAGNOSIS — M79674 Pain in right toe(s): Secondary | ICD-10-CM | POA: Diagnosis not present

## 2024-04-14 NOTE — Progress Notes (Signed)
  Subjective:  Patient ID: Ian Parker, male    DOB: [birth year],  MRN: [id number]  Ian Parker presents to clinic today for at risk foot care with history of peripheral neuropathy and painful, elongated thickened toenails x 10 which are symptomatic when wearing enclosed shoe gear. This interferes with his/her daily activities.  He is accompaneid by his son on today's visit.  New problem(s): None.   PCP is Aileen Alexanders, NP.  Allergies  Allergen Reactions   Ace Inhibitors    Peanuts [Peanut Oil]    Penicillins    Sulfa Antibiotics     Review of Systems: Negative except as noted in the HPI.  Objective: No changes noted in today's physical examination. There were no vitals filed for this visit. Ian Parker is a pleasant 89 y.o. male WD, WN in NAD. AAO x 3.  Vascular Examination: CFT <3 seconds b/l. DP/PT pulses faintly palpable b/l. Skin temperature gradient warm to warm b/l. No pain with calf compression. No ischemia or gangrene. No cyanosis or clubbing noted b/l. No edema noted b/l LE.   Neurological Examination: Sensation grossly intact b/l with 10 gram monofilament. Vibratory sensation intact b/l. Pt has subjective symptoms of neuropathy.  Dermatological Examination: Pedal skin warm and supple b/l.   No open wounds. No interdigital macerations.  Toenails 1-5 b/l thick, discolored, elongated with subungual debris and pain on dorsal palpation.    No hyperkeratotic nor porokeratotic lesions present on today's visit.  Musculoskeletal Examination: Normal muscle strength 5/5 to all lower extremity muscle groups bilaterally. HAV with bunion deformity noted b/l LE.Aaron Aas No pain, crepitus or joint limitation noted with ROM b/l LE.  Patient ambulates independently without assistive aids.  Radiographs: None  Last A1c:      Latest Ref Rng & Units 09/02/2023   11:25 AM 05/27/2023   11:26 AM  Hemoglobin A1C  Hemoglobin-A1c 4.8 - 5.6 % 6.3  5.9    Assessment/Plan: 1. Pain  due to onychomycosis of toenails of both feet   2. Other polyneuropathy   -Patient with h/o dementia/Alzheimer's/cognitive deficit. Patient's family member present. All questions/concerns addressed on today's visit. -Continue supportive shoe gear daily. -Toenails 1-5 b/l were debrided in length and girth with sterile nail nippers and dremel without iatrogenic bleeding.  -Patient/POA to call should there be question/concern in the interim.   Return in about 3 months (around 07/07/2024).  Luella Sager, DPM      [place] LOCATION: [address], [place] [address]                   Office [phone]   [facility] LOCATION: [address] [place], [place] [address] Office [phone]

## 2024-04-23 ENCOUNTER — Other Ambulatory Visit: Payer: Self-pay | Admitting: Nurse Practitioner

## 2024-04-25 NOTE — Telephone Encounter (Signed)
 Requested Prescriptions  Pending Prescriptions Disp Refills   KLOR-CON  M20 20 MEQ tablet [Pharmacy Med Name: Klor-Con  M20 20 MEQ Oral Tablet Extended Release] 360 tablet 0    Sig: Take 2 tablets by mouth twice daily     Endocrinology:  Minerals - Potassium Supplementation Failed - 04/25/2024  1:54 PM      Failed - Cr in normal range and within 360 days    Creatinine  Date Value Ref Range Status  05/07/2013 1.21 0.60 - 1.30 mg/dL Final   Creatinine, Ser  Date Value Ref Range Status  02/17/2024 1.29 (H) 0.61 - 1.24 mg/dL Final         Failed - Valid encounter within last 12 months    Recent Outpatient Visits   None     Future Appointments             In 3 months McGowan, Clotilda DELENA RIGGERS [facility] - K in normal range and within 360 days    Potassium  Date Value Ref Range Status  02/17/2024 3.5 3.5 - 5.1 mmol/L Final  05/07/2013 3.8 3.5 - 5.1 mmol/L Final

## 2024-05-08 ENCOUNTER — Emergency Department

## 2024-05-08 ENCOUNTER — Other Ambulatory Visit: Payer: Self-pay

## 2024-05-08 ENCOUNTER — Encounter: Payer: Self-pay | Admitting: Nurse Practitioner

## 2024-05-08 ENCOUNTER — Observation Stay
Admission: EM | Admit: 2024-05-08 | Discharge: 2024-05-10 | Disposition: A | Discharge status: Home / Self Care | Attending: Internal Medicine | Admitting: Internal Medicine

## 2024-05-08 DIAGNOSIS — W44F9XA Other object of natural or organic material, entering into or through a natural orifice, initial encounter: Secondary | ICD-10-CM | POA: Insufficient documentation

## 2024-05-08 DIAGNOSIS — Z8546 Personal history of malignant neoplasm of prostate: Secondary | ICD-10-CM | POA: Diagnosis not present

## 2024-05-08 DIAGNOSIS — N200 Calculus of kidney: Secondary | ICD-10-CM | POA: Diagnosis not present

## 2024-05-08 DIAGNOSIS — T189XXA Foreign body of alimentary tract, part unspecified, initial encounter: Secondary | ICD-10-CM

## 2024-05-08 DIAGNOSIS — G309 Alzheimer's disease, unspecified: Secondary | ICD-10-CM | POA: Diagnosis not present

## 2024-05-08 DIAGNOSIS — Z79899 Other long term (current) drug therapy: Secondary | ICD-10-CM | POA: Insufficient documentation

## 2024-05-08 DIAGNOSIS — I10 Essential (primary) hypertension: Secondary | ICD-10-CM | POA: Insufficient documentation

## 2024-05-08 DIAGNOSIS — F0280 Dementia in other diseases classified elsewhere without behavioral disturbance: Secondary | ICD-10-CM | POA: Diagnosis not present

## 2024-05-08 DIAGNOSIS — J690 Pneumonitis due to inhalation of food and vomit: Secondary | ICD-10-CM | POA: Diagnosis not present

## 2024-05-08 DIAGNOSIS — T18108A Unspecified foreign body in esophagus causing other injury, initial encounter: Secondary | ICD-10-CM | POA: Diagnosis present

## 2024-05-08 DIAGNOSIS — I129 Hypertensive chronic kidney disease with stage 1 through stage 4 chronic kidney disease, or unspecified chronic kidney disease: Secondary | ICD-10-CM | POA: Diagnosis not present

## 2024-05-08 DIAGNOSIS — M47812 Spondylosis without myelopathy or radiculopathy, cervical region: Secondary | ICD-10-CM | POA: Insufficient documentation

## 2024-05-08 DIAGNOSIS — K2289 Other specified disease of esophagus: Secondary | ICD-10-CM | POA: Diagnosis not present

## 2024-05-08 DIAGNOSIS — T18108S Unspecified foreign body in esophagus causing other injury, sequela: Secondary | ICD-10-CM | POA: Diagnosis present

## 2024-05-08 DIAGNOSIS — I440 Atrioventricular block, first degree: Secondary | ICD-10-CM | POA: Diagnosis not present

## 2024-05-08 DIAGNOSIS — E538 Deficiency of other specified B group vitamins: Secondary | ICD-10-CM | POA: Insufficient documentation

## 2024-05-08 DIAGNOSIS — J189 Pneumonia, unspecified organism: Secondary | ICD-10-CM | POA: Diagnosis not present

## 2024-05-08 DIAGNOSIS — I351 Nonrheumatic aortic (valve) insufficiency: Secondary | ICD-10-CM | POA: Insufficient documentation

## 2024-05-08 DIAGNOSIS — E876 Hypokalemia: Secondary | ICD-10-CM | POA: Insufficient documentation

## 2024-05-08 DIAGNOSIS — N182 Chronic kidney disease, stage 2 (mild): Secondary | ICD-10-CM | POA: Diagnosis not present

## 2024-05-08 DIAGNOSIS — I6782 Cerebral ischemia: Secondary | ICD-10-CM | POA: Diagnosis not present

## 2024-05-08 DIAGNOSIS — F0390 Unspecified dementia without behavioral disturbance: Secondary | ICD-10-CM | POA: Insufficient documentation

## 2024-05-08 DIAGNOSIS — W19XXXA Unspecified fall, initial encounter: Principal | ICD-10-CM | POA: Insufficient documentation

## 2024-05-08 LAB — CBC
HCT: 48.6 % (ref 39.0–52.0)
Hemoglobin: 15.5 g/dL (ref 13.0–17.0)
MCH: 25.6 pg — ABNORMAL LOW (ref 26.0–34.0)
MCHC: 31.9 g/dL (ref 30.0–36.0)
MCV: 80.2 fL (ref 80.0–100.0)
Platelets: 164 K/uL (ref 150–400)
RBC: 6.06 MIL/uL — ABNORMAL HIGH (ref 4.22–5.81)
RDW: 14.9 % (ref 11.5–15.5)
WBC: 6.3 K/uL (ref 4.0–10.5)
nRBC: 0 % (ref 0.0–0.2)

## 2024-05-08 LAB — BASIC METABOLIC PANEL WITH GFR
Anion gap: 12 (ref 5–15)
BUN: 21 mg/dL (ref 8–23)
CO2: 26 mmol/L (ref 22–32)
Calcium: 9.4 mg/dL (ref 8.9–10.3)
Chloride: 105 mmol/L (ref 98–111)
Creatinine, Ser: 1.25 mg/dL — ABNORMAL HIGH (ref 0.61–1.24)
GFR, Estimated: 54 mL/min — ABNORMAL LOW (ref >60)
Glucose, Bld: 107 mg/dL — ABNORMAL HIGH (ref 70–99)
Potassium: 3.5 mmol/L (ref 3.5–5.1)
Sodium: 143 mmol/L (ref 135–145)

## 2024-05-08 NOTE — Telephone Encounter (Signed)
 Called to patients daughter Ian Parker however she was not at the house with her father. She called to the house and the other daughter took the call. We discussed how he was doing and that it appeared he was feeling a little better. No longer holding his arm. The mention again of the not recognizing the daughter was brought up and we all agreed the best course of action was for Ian Parker to be taken to the ED to be checked out further and ensure no other concerns.

## 2024-05-08 NOTE — ED Triage Notes (Signed)
 Pt arrives via POV with c/o a fall today. Pt was checked out by EMS and family reached out to pt's PCP and PCP wanted him to be seen. Pt denies any pain, but per family pt had grabbed their left arm several times. Pt also has been having issues swallowing for the last 2 weeks. Pt has a hx of dementia and is A&Ox3.

## 2024-05-08 NOTE — ED Provider Notes (Signed)
 [facility] Provider Note    Event Date/Time   First MD Initiated Contact with Patient 05/08/24 2317     (approximate)   History   Fall   HPI  Ian Parker is a 89 y.o. male   Past medical history of dementia who is here with his children after experiencing a fall this afternoon.  He does not recall the events which is not uncommon for him.  His children are poor due to his dementia.  He was with some family members, he was in the kitchen and they were in the other room, when he went for a glass of lemonade and they heard him fall.  He was awake when the family members got into the room to assist him back on his feet and was evaluated by paramedics and felt well so declined transport.  He started complaining of some left arm pain.  This arm pain has now resolved.  Over the past 1 week he has had a productive cough.  No fevers or chills.  No chest pain or shortness of breath.  He does not recall the events from today.  He has no complaints.  Mental status at this time is at baseline per family report.   Independent Historian contributed to assessment above: His son and daughter are at bedside to corroborate information past medical history above    Physical Exam   Triage Vital Signs: ED Triage Vitals [05/08/24 1652]  Encounter Vitals Group     BP 131/60     Girls Systolic BP Percentile      Girls Diastolic BP Percentile      Boys Systolic BP Percentile      Boys Diastolic BP Percentile      Pulse Rate 79     Resp 16     Temp 98.8 F (37.1 C)     Temp Source Oral     SpO2 99 %     Weight 140 lb (63.5 kg)     Height 5' 9 (1.753 m)     Head Circumference      Peak Flow      Pain Score      Pain Loc      Pain Education      Exclude from Growth Chart     Most recent vital signs: Vitals:   05/09/24 0115 05/09/24 0201  BP:  136/75  Pulse: 60 (!) 59  Resp:  16  Temp:  (!) 97.5 F (36.4 C)  SpO2: 97% 96%    General: Awake, no  distress.  CV:  Good peripheral perfusion.  Resp:  Normal effort.  Abd:  No distention.  Other:  Pleasant gentleman with normal vital signs in no acute distress in the stretcher.  He is able to range fully at all extremities at all joints with good full strength.  Has a clear chest auscultation, normal rate and rhythm, soft benign abdominal exam and no tenderness palpation of the thorax, back, neck, or abdomen.  He has no signs of head trauma.   ED Results / Procedures / Treatments   Labs (all labs ordered are listed, but only abnormal results are displayed) Labs Reviewed  CBC - Abnormal; Notable for the following components:      Result Value   RBC 6.06 (*)    MCH 25.6 (*)    All other components within normal limits  BASIC METABOLIC PANEL WITH GFR - Abnormal; Notable for the following components:  Glucose, Bld 107 (*)    Creatinine, Ser 1.25 (*)    GFR, Estimated 54 (*)    All other components within normal limits  TROPONIN I (HIGH SENSITIVITY)     I ordered and reviewed the above labs they are notable for cell counts electrolytes unremarkable.  EKG  ED ECG REPORT I, Ginnie Shams, the attending physician, personally viewed and interpreted this ECG.   Date: 05/08/2024  EKG Time: 0024  Rate: 63  Rhythm: sinus  Axis: nl  Intervals: First-degree AV block, PVCs  ST&T Change: no stemi    RADIOLOGY I independently reviewed and interpreted chest x-ray and I see no focality or pneumothorax though I does see a hyperdense object in the mid upper abdomen concerning for foreign body. I also reviewed radiologist's formal read.   PROCEDURES:  Critical Care performed: No  Procedures   MEDICATIONS ORDERED IN ED: Medications  amoxicillin -clavulanate (AUGMENTIN ) 875-125 MG per tablet 1 tablet (1 tablet Oral Given 05/09/24 0240)     IMPRESSION / MDM / ASSESSMENT AND PLAN / ED COURSE  I reviewed the triage vital signs and the nursing notes.                                 Patient's presentation is most consistent with acute presentation with potential threat to life or bodily function.  Differential diagnosis includes, but is not limited to, syncope, ACS, dysrhythmia, electrolyte derangement, pneumonia, traumatic injury including C-spine injury or head injury   The patient is on the cardiac monitor to evaluate for evidence of arrhythmia and/or significant heart rate changes.  MDM:    Well-appearing patient after fall today unknown whether syncopal or mechanical in nature so we will get a medical workup especially in light of his recent productive cough.  Check chest x-ray for pneumonia.  Basic labs within normal limits and no significant traumatic injuries on my exam.  Will get a CT of the head neck given his age and unknown head strike to rule out ICH.  He complained of left arm pain but there is no traumatic injury noted on my exam today nor is he having any complaints of pain at this time.  I doubt significant injury like fracture defer imaging and family in agreement.  I wonder if had a cardiac event that led to his fall this afternoon especially with his left arm pain previously.  EKG is nonischemic.  Will check a single troponin as ACS is low clinical suspicion given no ongoing chest pain and given that he has many hours out from his incident if cardiac ischemia initial troponin should be diagnostic.  He feels well looks well now.  If imaging and lab workup unremarkable plan will be for discharge home with his family.    -- Patient remained stable with no complaints.  Noted foreign body on chest x-ray, does not appear extrinsic as an examination of the back and abdomen/chest shows no foreign body there.  I wonder if he swallowed something which may account for his cough over the last week.  It does not look to be in the trachea or lungs.  Likely in the GE junction.  Does not look to be sharp or button battery so I doubt he will need an emergency  procedure tonight.  Will get a CT for further characterization of the type of object and location  Distal esophageal foreign body.  Patient has been tolerating p.o.  and just ate a sandwich this evening.  Some infiltrates in the lower lungs as well concerning for pneumonia, especially in light of his productive cough.  Started on antibiotics.  I think that the distal esophageal foreign body has been bothering him over the last 1 week as he has been complaining to his family members of something stuck in that area, and coughing.  Consulted with Dr. Therisa of GI for endoscopy in the morning.  Admission.       FINAL CLINICAL IMPRESSION(S) / ED DIAGNOSES   Final diagnoses:  Fall, initial encounter  Swallowed foreign body, initial encounter  Aspiration pneumonia of both lower lobes, unspecified aspiration pneumonia type (HCC)     Rx / DC Orders   ED Discharge Orders     None        Note:  This document was prepared using Dragon voice recognition software and may include unintentional dictation errors.    Cyrena Mylar, MD 05/09/24 [phone]

## 2024-05-09 ENCOUNTER — Encounter: Payer: Self-pay | Admitting: Internal Medicine

## 2024-05-09 ENCOUNTER — Observation Stay: Admit: 2024-05-09

## 2024-05-09 ENCOUNTER — Emergency Department

## 2024-05-09 ENCOUNTER — Encounter
Admission: EM | Disposition: A | Discharge status: Home / Self Care | Payer: Self-pay | Source: Home / Self Care | Attending: Emergency Medicine

## 2024-05-09 ENCOUNTER — Observation Stay: Admitting: Certified Registered"

## 2024-05-09 DIAGNOSIS — W19XXXA Unspecified fall, initial encounter: Secondary | ICD-10-CM

## 2024-05-09 DIAGNOSIS — T18108D Unspecified foreign body in esophagus causing other injury, subsequent encounter: Secondary | ICD-10-CM

## 2024-05-09 DIAGNOSIS — W19XXXD Unspecified fall, subsequent encounter: Secondary | ICD-10-CM

## 2024-05-09 DIAGNOSIS — T18108A Unspecified foreign body in esophagus causing other injury, initial encounter: Secondary | ICD-10-CM | POA: Diagnosis present

## 2024-05-09 DIAGNOSIS — T18108S Unspecified foreign body in esophagus causing other injury, sequela: Secondary | ICD-10-CM | POA: Diagnosis present

## 2024-05-09 HISTORY — PX: ESOPHAGOGASTRODUODENOSCOPY: SHX5428

## 2024-05-09 HISTORY — PX: FOREIGN BODY RETRIEVAL: CATH118241

## 2024-05-09 LAB — TROPONIN I (HIGH SENSITIVITY)
Troponin I (High Sensitivity): 8 ng/L (ref <18)
Troponin I (High Sensitivity): 7 ng/L (ref <18)

## 2024-05-09 LAB — BASIC METABOLIC PANEL WITH GFR
Anion gap: 9 (ref 5–15)
BUN: 17 mg/dL (ref 8–23)
CO2: 28 mmol/L (ref 22–32)
Calcium: 9.1 mg/dL (ref 8.9–10.3)
Chloride: 104 mmol/L (ref 98–111)
Creatinine, Ser: 0.99 mg/dL (ref 0.61–1.24)
GFR, Estimated: >60 mL/min (ref >60)
Glucose, Bld: 143 mg/dL — ABNORMAL HIGH (ref 70–99)
Potassium: 3.3 mmol/L — ABNORMAL LOW (ref 3.5–5.1)
Sodium: 141 mmol/L (ref 135–145)

## 2024-05-09 LAB — CBG MONITORING, ED: Glucose-Capillary: 136 mg/dL — ABNORMAL HIGH (ref 70–99)

## 2024-05-09 SURGERY — EGD (ESOPHAGOGASTRODUODENOSCOPY)
Anesthesia: General

## 2024-05-09 MED ORDER — GALANTAMINE HYDROBROMIDE ER 8 MG PO CP24
8.0000 mg | ORAL_CAPSULE | Freq: Every day | ORAL | Status: DC
Start: 2024-05-10 — End: 2024-05-10
  Administered 2024-05-10: 8 mg via ORAL
  Filled 2024-05-09: qty 1

## 2024-05-09 MED ORDER — ACETAMINOPHEN 650 MG RE SUPP: 650.0000 mg | Freq: Four times a day (QID) | RECTAL | Status: DC | PRN

## 2024-05-09 MED ORDER — MEMANTINE HCL 10 MG PO TABS
10.0000 mg | ORAL_TABLET | Freq: Two times a day (BID) | ORAL | Status: DC
  Administered 2024-05-09 – 2024-05-10 (×2): 10 mg via ORAL
  Filled 2024-05-09 (×2): qty 1

## 2024-05-09 MED ORDER — FENTANYL CITRATE (PF) 100 MCG/2ML IJ SOLN
INTRAMUSCULAR | Status: DC | PRN
  Administered 2024-05-09: 50 ug via INTRAVENOUS

## 2024-05-09 MED ORDER — DEXAMETHASONE SODIUM PHOSPHATE 10 MG/ML IJ SOLN
INTRAMUSCULAR | Status: DC | PRN
  Administered 2024-05-09: 10 mg via INTRAVENOUS

## 2024-05-09 MED ORDER — AMOXICILLIN-POT CLAVULANATE 875-125 MG PO TABS
1.0000 | ORAL_TABLET | Freq: Once | ORAL | Status: AC
  Administered 2024-05-09: 1 via ORAL
  Filled 2024-05-09: qty 1

## 2024-05-09 MED ORDER — ACETAMINOPHEN 325 MG PO TABS
650.0000 mg | ORAL_TABLET | Freq: Four times a day (QID) | ORAL | Status: DC | PRN
Start: 2024-05-09 — End: 2024-05-10
  Administered 2024-05-09: 650 mg via ORAL
  Filled 2024-05-09 (×2): qty 2

## 2024-05-09 MED ORDER — SODIUM CHLORIDE 0.9% FLUSH
3.0000 mL | Freq: Two times a day (BID) | INTRAVENOUS | Status: DC
  Administered 2024-05-09 – 2024-05-10 (×3): 3 mL via INTRAVENOUS

## 2024-05-09 MED ORDER — PROPOFOL 10 MG/ML IV BOLUS
INTRAVENOUS | Status: DC | PRN
  Administered 2024-05-09: 70 mg via INTRAVENOUS

## 2024-05-09 MED ORDER — ONDANSETRON HCL 4 MG/2ML IJ SOLN
INTRAMUSCULAR | Status: DC | PRN
  Administered 2024-05-09: 4 mg via INTRAVENOUS

## 2024-05-09 MED ORDER — SUCCINYLCHOLINE CHLORIDE 200 MG/10ML IV SOSY
PREFILLED_SYRINGE | INTRAVENOUS | Status: DC | PRN
  Administered 2024-05-09: 100 mg via INTRAVENOUS

## 2024-05-09 MED ORDER — ONDANSETRON HCL 4 MG PO TABS
4.0000 mg | ORAL_TABLET | Freq: Four times a day (QID) | ORAL | Status: DC | PRN
Start: 2024-05-09 — End: 2024-05-10

## 2024-05-09 MED ORDER — HYDRALAZINE HCL 20 MG/ML IJ SOLN: 5.0000 mg | INTRAMUSCULAR | Status: DC | PRN

## 2024-05-09 MED ORDER — FENTANYL CITRATE (PF) 100 MCG/2ML IJ SOLN
INTRAMUSCULAR | Status: AC
Start: 2024-05-09 — End: 2024-05-09
  Filled 2024-05-09: qty 2

## 2024-05-09 MED ORDER — MIRTAZAPINE 15 MG PO TABS
7.5000 mg | ORAL_TABLET | Freq: Every day | ORAL | Status: DC
  Administered 2024-05-09: 7.5 mg via ORAL
  Filled 2024-05-09: qty 1

## 2024-05-09 MED ORDER — LIDOCAINE HCL (CARDIAC) PF 100 MG/5ML IV SOSY
PREFILLED_SYRINGE | INTRAVENOUS | Status: DC | PRN
  Administered 2024-05-09: 40 mg via INTRAVENOUS

## 2024-05-09 MED ORDER — TRAZODONE HCL 50 MG PO TABS
50.0000 mg | ORAL_TABLET | Freq: Every evening | ORAL | Status: DC | PRN
  Administered 2024-05-09: 50 mg via ORAL
  Filled 2024-05-09: qty 1

## 2024-05-09 MED ORDER — VITAMIN B-12 1000 MCG PO TABS
1000.0000 ug | ORAL_TABLET | Freq: Every day | ORAL | Status: DC
  Administered 2024-05-10: 1000 ug via ORAL
  Filled 2024-05-09: qty 1

## 2024-05-09 MED ORDER — POTASSIUM CHLORIDE 20 MEQ PO PACK
20.0000 meq | PACK | Freq: Once | ORAL | Status: AC
  Administered 2024-05-09: 20 meq via ORAL
  Filled 2024-05-09: qty 1

## 2024-05-09 MED ORDER — ONDANSETRON HCL 4 MG/2ML IJ SOLN: 4.0000 mg | Freq: Four times a day (QID) | INTRAMUSCULAR | Status: DC | PRN

## 2024-05-09 MED ORDER — SODIUM CHLORIDE 0.9 % IV SOLN: INTRAVENOUS | Status: DC

## 2024-05-09 NOTE — Progress Notes (Addendum)
 Progress Note   [name] FMW:[id number] DOB: 24-Nov-1930 DOA: 05/08/2024     0 DOS: the patient was seen and examined on 05/09/2024   Brief hospital course:  Per H&P HPI  LINDELL RENFREW is a 89 y.o. male with medical history significant for Dementia, HTN and BPH, being admitted for foreign metallic object in esophagus associated with coughing and discomfort for the past week.  Patient and initially presented for an unwitnessed fall.  History provided by son at bedside who states that his sister heard the patient fall in the kitchen and when she went to check he was sitting on the ground.  According to the son there was no loss of consciousness reported.  They brought him to the hospital to be checked out and at the same time decided to mention the cough he had been having for the past week.  He had also complained of discomfort on swallowing.  Patient unable to contribute to the history due to dementia. In the ED vitals within normal limits.  CBC and BMP and troponin within normal limits.  EKG showing sinus at 63 with first-degree AV block and PVCs. CT abdomen and pelvis without contrast showing a metallic foreign body in the distal esophagus just above the EG junction, patchy infiltration or atelectasis lung bases and a 7 mm nonobstructing stone lower pole of left kidney. The ED provider spoke with GI, Dr. Therisa who will take patient to the endoscopy suite in the a.m. Admission requested    Assessment and Plan:  Fall at home, possible syncope According to son, there was no loss of consciousness suspected by family.  However this was unwitnessed.  Patient has not had a previous fall. His family noted that he was having significant amount of coughing in the setting of ingesting a penny.  Is possible that patient syncopized due to this.  Patient noted to have PR prolongation and PVCs on admission ECG.  It is unlikely these precipitated his fall. No dyspnea or tachycardia to suggest PE.  It  is possible that his dementia medications contributed to his fall as well, though patient has been on these chronically and not had issues in the past.  Considered dehydration given elevated serum creatinine on admission , though baseline serum creatinine is unclear.  -Repeat EKG in the AM -PT eval  -F/u orthostatic vital signs -Continue cardiac monitoring -F/u TTE  If w/u unrevealing can likely d/c home in the AM    Foreign body of esophagus, initial encounter Patient is status post EGD with removal of a penny.  CKD stage 2 Unclear baseline serum creatinine.  0.9 this AM.  BMP in the AM   Alzheimer's dementia without behavioral disturbance (HCC) Delirium precautions Resume home medications  HTN (hypertension) Normotensive.  Hold home antihypertensives  Hypokalemia Unclear etiology. Will replace   Vit B12 deficiency  1400 5 mo ago.  Hold home b12 -F/u b12 level       Subjective: Patient in no pain.   Physical Exam: Vitals:   05/09/24 1149 05/09/24 1209 05/09/24 1433 05/09/24 2104  BP: 136/82 (!) 144/73 126/72 (!) 109/55  Pulse: 67 70 61 68  Resp: (!) 21 18 16 16   Temp:   98.1 F (36.7 C) (!) 97.3 F (36.3 C)  TempSrc:    Axillary  SpO2: 99% 99% 100% 97%  Weight:      Height:       Physical Exam  Constitutional: In no distress.  Cardiovascular: Normal rate,  regular rhythm. No lower extremity edema  Pulmonary: Non labored breathing on room air, no wheezing or rales.  Abdominal: Soft. Normal bowel sounds. Non distended and non tender Musculoskeletal: Normal range of motion.     Neurological: Alert confused at baseline. Skin: Skin is warm and dry.   Data Reviewed:     Latest Ref Rng & Units 05/09/2024    2:49 PM 05/08/2024    5:04 PM 02/17/2024    9:28 AM  BMP  Glucose 70 - 99 mg/dL 856  892  861   BUN 8 - 23 mg/dL 17  21  18    Creatinine 0.61 - 1.24 mg/dL 9.00  8.74  8.70   Sodium 135 - 145 mmol/L 141  143  139   Potassium 3.5 - 5.1 mmol/L 3.3  3.5   3.5   Chloride 98 - 111 mmol/L 104  105  104   CO2 22 - 32 mmol/L 28  26  25    Calcium 8.9 - 10.3 mg/dL 9.1  9.4  9.1       Latest Ref Rng & Units 05/08/2024    5:04 PM 02/17/2024    9:28 AM 11/23/2023    1:46 PM  CBC  WBC 4.0 - 10.5 K/uL 6.3  3.8  5.8   Hemoglobin 13.0 - 17.0 g/dL 84.4  83.4  84.1   Hematocrit 39.0 - 52.0 % 48.6  51.3  49.4   Platelets 150 - 400 K/uL 164  151  169      Family Communication: Spouse and daugthers at bedside. In agreement with plan.   Disposition: Status is: Observation The patient remains OBS appropriate and will d/c before 2 midnights.  Planned Discharge Destination: Home    Time spent: 35 minutes  Author: Alban Pepper, MD 05/09/2024 9:20 PM  For on call review [email or web address].

## 2024-05-09 NOTE — Anesthesia Procedure Notes (Signed)
 Procedure Name: Intubation Date/Time: 05/09/2024 11:00 AM  Performed by: Landy Francena BIRCH, CRNAPre-anesthesia Checklist: Patient identified, Emergency Drugs available, Suction available and Patient being monitored Patient Re-evaluated:Patient Re-evaluated prior to induction Oxygen Delivery Method: Circle system utilized Preoxygenation: Pre-oxygenation with 100% oxygen Induction Type: IV induction, Rapid sequence and Cricoid Pressure applied Laryngoscope Size: McGrath and 3 Grade View: Grade I Tube type: Oral Tube size: 7.5 mm Number of attempts: 1 Airway Equipment and Method: Stylet, Oral airway and Bite block Placement Confirmation: ETT inserted through vocal cords under direct vision, positive ETCO2 and breath sounds checked- equal and bilateral Secured at: 21 cm Tube secured with: Tape Dental Injury: Teeth and Oropharynx as per pre-operative assessment

## 2024-05-09 NOTE — ED Notes (Signed)
 Patient was allowed to sit on the side of the bed to use the urinal with the aide of the family member in the room.

## 2024-05-09 NOTE — Assessment & Plan Note (Signed)
Hydralazine IV as needed while n.p.o. 

## 2024-05-09 NOTE — Op Note (Addendum)
 [facility] Gastroenterology Patient Name: Ian Parker Procedure Date: 05/09/2024 10:41 AM MRN: [id number] Account #: [id number] Date of Birth: 24-Jul-1931 Admit Type: Outpatient Age: 89 Room: [facility] ENDO ROOM 4 Gender: Male Note Status: Finalized Instrument Name: Upper Endoscope [id number] Procedure:             Upper GI endoscopy Indications:           Foreign body in the esophagus Providers:             Rogelia Copping MD, MD Medicines:             General Anesthesia Complications:         No immediate complications. Procedure:             Pre-Anesthesia Assessment:                        - Prior to the procedure, a History and Physical was                         performed, and patient medications and allergies were                         reviewed. The patient's tolerance of previous                         anesthesia was also reviewed. The risks and benefits                         of the procedure and the sedation options and risks                         were discussed with the patient. All questions were                         answered, and informed consent was obtained. Prior                         Anticoagulants: The patient has taken no anticoagulant                         or antiplatelet agents. ASA Grade Assessment: II - A                         patient with mild systemic disease. After reviewing                         the risks and benefits, the patient was deemed in                         satisfactory condition to undergo the procedure.                        After obtaining informed consent, the endoscope was                         passed under direct vision. Throughout the procedure,                         the  patient's blood pressure, pulse, and oxygen                         saturations were monitored continuously. The Endoscope                         was introduced through the mouth, and advanced to the                         second part of  duodenum. The upper GI endoscopy was                         accomplished without difficulty. The patient tolerated                         the procedure well. Findings:      A penny was found in the lower third of the esophagus. Removal was       accomplished with a basket.      The Z-line was irregular.      The stomach was normal.      The examined duodenum was normal. Impression:            - A penny was found in the esophagus. Removal was                         successful.                        - Z-line irregular.                        - Normal stomach.                        - Normal examined duodenum. Recommendation:        - Return patient to hospital ward for ongoing care.                        - Resume previous diet.                        - Continue present medications. Procedure Code(s):     --- Professional ---                        [phone], Esophagogastroduodenoscopy, flexible,                         transoral; with removal of foreign body(s) Diagnosis Code(s):     --- Professional ---                        T18.108A, Unspecified foreign body in esophagus                         causing other injury, initial encounter CPT copyright 2022 American Medical Association. All rights reserved. The codes documented in this report are preliminary and upon coder review may  be revised to meet current compliance requirements. Rogelia Copping MD, MD 05/09/2024 11:09:34 AM This report has been signed electronically. Number of Addenda: 0 Note Initiated On: 05/09/2024 10:41 AM Estimated  Blood Loss:  Estimated blood loss: none.      [facility]

## 2024-05-09 NOTE — Anesthesia Preprocedure Evaluation (Addendum)
 Anesthesia Evaluation  Patient identified by MRN, date of birth, ID band Patient awake    Reviewed: Allergy & Precautions, NPO status , Patient's Chart, lab work & pertinent test results  History of Anesthesia Complications Negative for: history of anesthetic complications  Airway Mallampati: III  TM Distance: >3 FB Neck ROM: full    Dental  (+) Lower Dentures, Upper Dentures   Pulmonary neg pulmonary ROS   Pulmonary exam normal        Cardiovascular hypertension, On Medications Normal cardiovascular exam     Neuro/Psych  PSYCHIATRIC DISORDERS       Neuromuscular disease    GI/Hepatic Neg liver ROS, PUD,,,  Endo/Other  negative endocrine ROS    Renal/GU      Musculoskeletal   Abdominal   Peds  Hematology  (+) Blood dyscrasia, anemia   Anesthesia Other Findings Past Medical History: No date: Abnormal EKG No date: Abnormal PSA No date: BPH (benign prostatic hyperplasia) No date: Dysuria No date: Erectile dysfunction No date: Gastric ulcer No date: Goiter No date: Hemorrhoid No date: Inflammatory disease of prostate No date: Irritation of perirectal skin No date: Prostate cancer (HCC) No date: Rectal burning No date: Skin lesion  Past Surgical History: 02/23/2006: cryoablation of prostate 03/14/2015: ESOPHAGOGASTRODUODENOSCOPY; N/A     Comment:  Procedure: ESOPHAGOGASTRODUODENOSCOPY (EGD);  Surgeon:               Deward CINDERELLA Piedmont, MD;  Location: [facility] ENDOSCOPY;  Service:               Gastroenterology;  Laterality: N/A; No date: THYROIDECTOMY     Comment:  Subtotal  BMI    Body Mass Index: 21.41 kg/m      Reproductive/Obstetrics negative OB ROS                              Anesthesia Physical Anesthesia Plan  ASA: 3  Anesthesia Plan: General ETT   Post-op Pain Management: Minimal or no pain anticipated   Induction: Intravenous  PONV Risk Score and Plan: 2 and  Ondansetron , Dexamethasone  and Treatment may vary due to age or medical condition  Airway Management Planned: Oral ETT  Additional Equipment:   Intra-op Plan:   Post-operative Plan: Extubation in OR  Informed Consent: I have reviewed the patients History and Physical, chart, labs and discussed the procedure including the risks, benefits and alternatives for the proposed anesthesia with the patient or authorized representative who has indicated his/her understanding and acceptance.     Dental Advisory Given  Plan Discussed with: Anesthesiologist, CRNA and Surgeon  Anesthesia Plan Comments: (Patient consented for risks of anesthesia including but not limited to:  - adverse reactions to medications - damage to eyes, teeth, lips or other oral mucosa - nerve damage due to positioning  - sore throat or hoarseness - Damage to heart, brain, nerves, lungs, other parts of body or loss of life  Patient voiced understanding and assent.)         Anesthesia Quick Evaluation

## 2024-05-09 NOTE — Progress Notes (Signed)
 The patient had an EGD with the removal of a penny from the distal esophagus.  Nothing further to do from GI point of view.  I will sign off.  Please call if any further GI concerns or questions.  We would like to thank you for the opportunity to participate in the care of Ian Parker.

## 2024-05-09 NOTE — Assessment & Plan Note (Signed)
 N.p.o. Formal GI consult placed for EGD in the a.m.

## 2024-05-09 NOTE — Assessment & Plan Note (Signed)
 According to son, there was no loss of consciousness suspected by family PT eval Cardiac monitoring overnight

## 2024-05-09 NOTE — Anesthesia Postprocedure Evaluation (Signed)
 Anesthesia Post Note  [name]  Procedure(s) Performed: EGD (ESOPHAGOGASTRODUODENOSCOPY) FOREIGN BODY RETRIEVAL  Patient location during evaluation: Endoscopy Anesthesia Type: General Level of consciousness: awake and alert Pain management: pain level controlled Vital Signs Assessment: post-procedure vital signs reviewed and stable Respiratory status: spontaneous breathing, nonlabored ventilation, respiratory function stable and patient connected to nasal cannula oxygen Cardiovascular status: blood pressure returned to baseline and stable Postop Assessment: no apparent nausea or vomiting Anesthetic complications: no   No notable events documented.   Last Vitals:  Vitals:   05/09/24 1125 05/09/24 1149  BP: 117/81 136/82  Pulse: 64 67  Resp: 17 (!) 21  Temp:    SpO2: 100% 99%    Last Pain:  Vitals:   05/09/24 1149  TempSrc:   PainSc: 0-No pain                 [name]

## 2024-05-09 NOTE — Consult Note (Signed)
 Rogelia Copping, MD [facility]  [address], [address] [place], [place] [address] Phone: [phone] Fax : [phone]  Consultation  Referring Provider:    Dr. Cleatus Primary Care Physician:  Melvin Pao, NP Primary Gastroenterologist: [facility]         Reason for Consultation:     Esophageal foreign body  Date of Admission:  05/08/2024 Date of Consultation:  05/09/2024         HPI:   Ian Parker is a 89 y.o. male who is being seen this morning with his son in the room.  The patient has a history significant of dementia.  He also has a history of hypertension and BPH.  The patient had been having coughing and discomfort for the past week and the patient was reported to have had a unwitnessed fall.  There is no reported loss of consciousness but the patient was complaining of trouble swallowing.  The patient underwent a CT scan of the abdomen that showed:  IMPRESSION: 1. Metallic foreign body is demonstrated in the distal esophagus just above the EG junction. Visualized distal esophagus is not dilated. 2. Patchy infiltration or atelectasis in the lung bases. 3. 7 mm nonobstructing stone in the lower pole of the left kidney.  The patient's last upper endoscopy was by Dr. Ora back in 2016. I am now being asked to see the patient for a foreign body seen in the esophagus.  Past Medical History:  Diagnosis Date   Abnormal EKG    Abnormal PSA    BPH (benign prostatic hyperplasia)    Dysuria    Erectile dysfunction    Gastric ulcer    Goiter    Hemorrhoid    Inflammatory disease of prostate    Irritation of perirectal skin    Prostate cancer (HCC)    Rectal burning    Skin lesion     Past Surgical History:  Procedure Laterality Date   cryoablation of prostate  02/23/2006   ESOPHAGOGASTRODUODENOSCOPY N/A 03/14/2015   Procedure: ESOPHAGOGASTRODUODENOSCOPY (EGD);  Surgeon: Deward CINDERELLA Ora, MD;  Location: [facility] ENDOSCOPY;  Service: Gastroenterology;  Laterality: N/A;   THYROIDECTOMY      Subtotal    Prior to Admission medications   Medication Sig Start Date End Date Taking? Authorizing Provider  amLODipine  (NORVASC ) 10 MG tablet Take 1 tablet (10 mg total) by mouth daily. 11/23/23   Melvin Pao, NP  cholecalciferol  (VITAMIN D ) 1000 UNITS tablet Take 1,000 Units by mouth daily. Two tablets once daily    [provider]  galantamine  (RAZADYNE  ER) 8 MG 24 hr capsule Take 1 capsule (8 mg total) by mouth daily with breakfast. 11/23/23   Melvin Pao, NP  KLOR-CON  M20 20 MEQ tablet Take 2 tablets by mouth twice daily 04/25/24   Melvin Pao, NP  loratadine (CLARITIN) 10 MG tablet Take 10 mg by mouth daily.    [provider]  memantine  (NAMENDA ) 10 MG tablet Take 1 tablet (10 mg total) by mouth 2 (two) times daily. 11/23/23   Melvin Pao, NP  mirtazapine  (REMERON ) 7.5 MG tablet Take 1 tablet (7.5 mg total) by mouth at bedtime. 11/23/23   Melvin Pao, NP  triamterene -hydrochlorothiazide  (MAXZIDE ) 75-50 MG tablet Take 1 tablet by mouth daily. 11/23/23   Melvin Pao, NP  vitamin B-12 (CYANOCOBALAMIN ) 1000 MCG tablet Take 1,000 mcg by mouth daily.    [provider]    Family History  Problem Relation Age of Onset   Heart attack Mother  Brain cancer Father        tumor unknown if cancer   Diabetes Sister    Hypertension Sister    Hyperlipidemia Sister    Heart attack Sister    Prostate cancer Brother    Kidney disease Neg Hx    Kidney cancer Neg Hx    Bladder Cancer Neg Hx      Social History   Tobacco Use   Smoking status: Never   Smokeless tobacco: Never  Vaping Use   Vaping status: Never Used  Substance Use Topics   Alcohol use: No    Alcohol/week: 0.0 standard drinks of alcohol   Drug use: No    Allergies as of 05/08/2024 - Review Complete 05/08/2024  Allergen Reaction Noted   Ace inhibitors  06/18/2015   Peanuts [peanut oil]  06/18/2015   Penicillins  03/14/2015   Sulfa antibiotics  03/13/2015     Review of Systems:    All systems reviewed and negative except where noted in HPI.   Physical Exam:  Vital signs in last 24 hours: Temp:  [97.5 F (36.4 C)-98.8 F (37.1 C)] 97.7 F (36.5 C) (07/08 0718) Pulse Rate:  [59-89] 89 (07/08 0718) Resp:  [16-21] 21 (07/08 0718) BP: (117-136)/(60-91) 117/88 (07/08 0718) SpO2:  [96 %-99 %] 98 % (07/08 0718) Weight:  [63.5 kg-65.8 kg] 65.8 kg (07/07 1701)   General:   Pleasant, cooperative in NAD Head:  Normocephalic and atraumatic. Eyes:   No icterus.   Conjunctiva pink. PERRLA. Ears:  Normal auditory acuity. Neck:  Supple; no masses or thyroidomegaly Lungs: Respirations even and unlabored. Lungs clear to auscultation bilaterally.   No wheezes, crackles, or rhonchi.  Heart:  Regular rate and rhythm;  Without murmur, clicks, rubs or gallops Abdomen:  Soft, nondistended, nontender. Normal bowel sounds. No appreciable masses or hepatomegaly.  No rebound or guarding.  Rectal:  Not performed. Msk:  Symmetrical without gross deformities.    Extremities:  Without edema, cyanosis or clubbing. Neurologic:  Alert;   Skin:  Intact without significant lesions or rashes. Cervical Nodes:  No significant cervical adenopathy. Psych: Confused  LAB RESULTS: Recent Labs    05/08/24 1704  WBC 6.3  HGB 15.5  HCT 48.6  PLT 164   BMET Recent Labs    05/08/24 1704  NA 143  K 3.5  CL 105  CO2 26  GLUCOSE 107*  BUN 21  CREATININE 1.25*  CALCIUM 9.4   LFT No results for input(s): PROT, ALBUMIN, AST, ALT, ALKPHOS, BILITOT, BILIDIR, IBILI in the last 72 hours. PT/INR No results for input(s): LABPROT, INR in the last 72 hours.  STUDIES: CT ABDOMEN PELVIS WO CONTRAST Result Date: 05/09/2024 CLINICAL DATA:  Pain after a fall today. Possible foreign body seen on chest radiograph. EXAM: CT ABDOMEN AND PELVIS WITHOUT CONTRAST TECHNIQUE: Multidetector CT imaging of the abdomen and pelvis was performed following the standard  protocol without IV contrast. RADIATION DOSE REDUCTION: This exam was performed according to the departmental dose-optimization program which includes automated exposure control, adjustment of the mA and/or kV according to patient size and/or use of iterative reconstruction technique. COMPARISON:  Chest radiograph 05/09/2024. CT abdomen pelvis 02/17/2024 FINDINGS: Lower chest: Atelectasis or infiltration in both lung bases. Metallic foreign body is demonstrated at the EG junction in the distal esophagus. Visualized proximal esophagus is not significantly dilated. Hepatobiliary: No focal liver abnormality is seen. No gallstones, gallbladder wall thickening, or biliary dilatation. Pancreas: Unremarkable. No pancreatic ductal dilatation or surrounding inflammatory changes.  Spleen: Normal in size without focal abnormality. Adrenals/Urinary Tract: No adrenal gland nodules. 7 mm stone in the lower pole left kidney. No hydronephrosis or hydroureter. Cyst in the left kidney measuring 2.5 cm diameter without change. No injury follow-up is indicated. Bladder is normal. Stomach/Bowel: Stomach is within normal limits. Appendix appears normal. No evidence of bowel wall thickening, distention, or inflammatory changes. Vascular/Lymphatic: Aortic atherosclerosis. No enlarged abdominal or pelvic lymph nodes. Reproductive: Prostate is unremarkable. Other: No abdominal wall hernia or abnormality. No abdominopelvic ascites. Musculoskeletal: Degenerative changes in the spine. No destructive bone lesions. IMPRESSION: 1. Metallic foreign body is demonstrated in the distal esophagus just above the EG junction. Visualized distal esophagus is not dilated. 2. Patchy infiltration or atelectasis in the lung bases. 3. 7 mm nonobstructing stone in the lower pole of the left kidney. Electronically Signed   By: Elsie Gravely M.D.   On: 05/09/2024 01:45   DG Chest Port 1 View Result Date: 05/09/2024 CLINICAL DATA:  Cough.  Fell today. EXAM:  PORTABLE CHEST 1 VIEW COMPARISON:  08/07/2019 FINDINGS: Heart size and pulmonary vascularity are normal for technique. Mild linear atelectasis in the lung bases. No airspace disease or consolidation is seen. No pleural effusion or pneumothorax. Mediastinal contours appear intact. Degenerative changes in the spine and shoulders. A rounded metallic foreign body is seen projected over the EG junction region. This could represent extrinsic structure or ingested foreign body. Correlate with physical examination. IMPRESSION: No evidence of active pulmonary disease. Rounded foreign body projecting over the EG junction is probably extrinsic but could represent ingested foreign body. Correlate with physical examination. Electronically Signed   By: Elsie Gravely M.D.   On: 05/09/2024 00:47   CT Head Wo Contrast Result Date: 05/08/2024 CLINICAL DATA:  Head and neck trauma fall EXAM: CT HEAD WITHOUT CONTRAST CT CERVICAL SPINE WITHOUT CONTRAST TECHNIQUE: Multidetector CT imaging of the head and cervical spine was performed following the standard protocol without intravenous contrast. Multiplanar CT image reconstructions of the cervical spine were also generated. RADIATION DOSE REDUCTION: This exam was performed according to the departmental dose-optimization program which includes automated exposure control, adjustment of the mA and/or kV according to patient size and/or use of iterative reconstruction technique. COMPARISON:  CT 08/07/2019, 12/18/2022 FINDINGS: CT HEAD FINDINGS Brain: No acute territorial infarction, hemorrhage or intracranial mass. Atrophy and chronic small vessel ischemic changes of the white matter. 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 Stable ventricle size Vascular: No hyperdense vessels.  Carotid vascular calcification Skull: Normal. Negative for fracture or focal lesion. Sinuses/Orbits: No acute finding. Other: None CT CERVICAL SPINE FINDINGS Alignment: Straightening  of the cervical spine. Trace anterolisthesis C4 on C5. Facet alignment is normal Skull base and vertebrae: No acute fracture. No primary bone lesion or focal pathologic process. Soft tissues and spinal canal: No prevertebral fluid or swelling. No visible canal hematoma. Disc levels: Moderate multilevel degenerative change with diffuse disc space narrowing and osteophyte. Multilevel facet degenerative change with foraminal narrowing. Posterior disc osteophyte with resultant at least mild canal stenosis at C3-C4, C4-C5, with more moderate canal stenosis at C5-C6 and C6-C7. Upper chest: Lung apices are clear.  Fibrosis at the right apex Other: None IMPRESSION: 1. No CT evidence for acute intracranial abnormality. Atrophy and chronic small vessel ischemic changes of the white matter. Small chronic infarcts as seen on prior exam 2. Straightening of the cervical spine with multilevel degenerative change. No acute osseous abnormality. Electronically Signed   By: Luke  Scott M.D.   On: 05/08/2024 23:59   CT Cervical Spine Wo Contrast Result Date: 05/08/2024 CLINICAL DATA:  Head and neck trauma fall EXAM: CT HEAD WITHOUT CONTRAST CT CERVICAL SPINE WITHOUT CONTRAST TECHNIQUE: Multidetector CT imaging of the head and cervical spine was performed following the standard protocol without intravenous contrast. Multiplanar CT image reconstructions of the cervical spine were also generated. RADIATION DOSE REDUCTION: This exam was performed according to the departmental dose-optimization program which includes automated exposure control, adjustment of the mA and/or kV according to patient size and/or use of iterative reconstruction technique. COMPARISON:  CT 08/07/2019, 12/18/2022 FINDINGS: CT HEAD FINDINGS Brain: No acute territorial infarction, hemorrhage or intracranial mass. Atrophy and chronic small vessel ischemic changes of the white matter. 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 Stable  ventricle size Vascular: No hyperdense vessels.  Carotid vascular calcification Skull: Normal. Negative for fracture or focal lesion. Sinuses/Orbits: No acute finding. Other: None CT CERVICAL SPINE FINDINGS Alignment: Straightening of the cervical spine. Trace anterolisthesis C4 on C5. Facet alignment is normal Skull base and vertebrae: No acute fracture. No primary bone lesion or focal pathologic process. Soft tissues and spinal canal: No prevertebral fluid or swelling. No visible canal hematoma. Disc levels: Moderate multilevel degenerative change with diffuse disc space narrowing and osteophyte. Multilevel facet degenerative change with foraminal narrowing. Posterior disc osteophyte with resultant at least mild canal stenosis at C3-C4, C4-C5, with more moderate canal stenosis at C5-C6 and C6-C7. Upper chest: Lung apices are clear.  Fibrosis at the right apex Other: None IMPRESSION: 1. No CT evidence for acute intracranial abnormality. Atrophy and chronic small vessel ischemic changes of the white matter. Small chronic infarcts as seen on prior exam 2. Straightening of the cervical spine with multilevel degenerative change. No acute osseous abnormality. Electronically Signed   By: Luke Scott M.D.   On: 05/08/2024 23:59      Impression / Plan:   Assessment: Principal Problem:   Foreign body of esophagus, initial encounter Active Problems:   HTN (hypertension)   Alzheimer's dementia without behavioral disturbance (HCC)   Fall at home, initial encounter   Ian Parker is a 89 y.o. y/o male with a metallic object found in the distal esophagus on imaging.  The patient has also been reporting some discomfort with swallowing.  He has a history of an esophageal stricture on his EGD back in 2016.  Plan:  The patient will be set up for an upper endoscopy for today.  I have spoken to the patient's son and explained our plan to him and he agrees with it.  Further recommendations based on findings of  the EGD.  Thank you for involving me in the care of this patient.      LOS: 0 days   Rogelia Copping, MD, MD. NOLIA 05/09/2024, 7:50 AM,  Pager [phone] 7am-5pm  Check AMION for 5pm -7am coverage and on weekends   Note: This dictation was prepared with Dragon dictation along with smaller phrase technology. Any transcriptional errors that result from this process are unintentional.

## 2024-05-09 NOTE — ED Notes (Signed)
 Pt found wandering from bed. Pt urinated in the floor. Pt cleaned and new gown and brief applied. Bed alarm set. Family at bedside.

## 2024-05-09 NOTE — H&P (Signed)
 History and Physical    [name] FMW:[id number] DOB: [birth year] DOA: 05/08/2024 DOS: the patient was seen and examined on 05/09/2024 PCP: Melvin Pao, NP  Patient coming from: Home  Chief Complaint:  Chief Complaint  Patient presents with   Fall    HPI: Ian Parker is a 89 y.o. male with medical history significant for Dementia, HTN and BPH, being admitted for foreign metallic object in esophagus associated with coughing and discomfort for the past week.  Patient and initially presented for an unwitnessed fall.  History provided by son at bedside who states that his sister heard the patient fall in the kitchen and when she went to check he was sitting on the ground.  According to the son there was no loss of consciousness reported.  They brought him to the hospital to be checked out and at the same time decided to mention the cough he had been having for the past week.  He had also complained of discomfort on swallowing.  Patient unable to contribute to the history due to dementia. In the ED vitals within normal limits.  CBC and BMP and troponin within normal limits.  EKG showing sinus at 63 with first-degree AV block and PVCs. CT abdomen and pelvis without contrast showing a metallic foreign body in the distal esophagus just above the EG junction, patchy infiltration or atelectasis lung bases and a 7 mm nonobstructing stone lower pole of left kidney. The ED provider spoke with GI, Dr. Therisa who will take patient to the endoscopy suite in the a.m. Admission requested     Past Medical History:  Diagnosis Date   Abnormal EKG    Abnormal PSA    BPH (benign prostatic hyperplasia)    Dysuria    Erectile dysfunction    Gastric ulcer    Goiter    Hemorrhoid    Inflammatory disease of prostate    Irritation of perirectal skin    Prostate cancer (HCC)    Rectal burning    Skin lesion    Past Surgical History:  Procedure Laterality Date   cryoablation of prostate   02/23/2006   ESOPHAGOGASTRODUODENOSCOPY N/A 03/14/2015   Procedure: ESOPHAGOGASTRODUODENOSCOPY (EGD);  Surgeon: Deward CINDERELLA Piedmont, MD;  Location: [facility] ENDOSCOPY;  Service: Gastroenterology;  Laterality: N/A;   THYROIDECTOMY     Subtotal   Social History:  reports that he has never smoked. He has never used smokeless tobacco. He reports that he does not drink alcohol and does not use drugs.  Allergies  Allergen Reactions   Ace Inhibitors    Peanuts [Peanut Oil]    Penicillins    Sulfa Antibiotics     Family History  Problem Relation Age of Onset   Heart attack Mother    Brain cancer Father        tumor unknown if cancer   Diabetes Sister    Hypertension Sister    Hyperlipidemia Sister    Heart attack Sister    Prostate cancer Brother    Kidney disease Neg Hx    Kidney cancer Neg Hx    Bladder Cancer Neg Hx     Prior to Admission medications   Medication Sig Start Date End Date Taking? Authorizing Provider  amLODipine  (NORVASC ) 10 MG tablet Take 1 tablet (10 mg total) by mouth daily. 11/23/23   Melvin Pao, NP  cholecalciferol  (VITAMIN D ) 1000 UNITS tablet Take 1,000 Units by mouth daily. Two tablets once daily    [provider]  galantamine  (RAZADYNE  ER) 8 MG 24 hr capsule Take 1 capsule (8 mg total) by mouth daily with breakfast. 11/23/23   Melvin Pao, NP  KLOR-CON  M20 20 MEQ tablet Take 2 tablets by mouth twice daily 04/25/24   Melvin Pao, NP  loratadine (CLARITIN) 10 MG tablet Take 10 mg by mouth daily.    [provider]  memantine  (NAMENDA ) 10 MG tablet Take 1 tablet (10 mg total) by mouth 2 (two) times daily. 11/23/23   Melvin Pao, NP  mirtazapine  (REMERON ) 7.5 MG tablet Take 1 tablet (7.5 mg total) by mouth at bedtime. 11/23/23   Melvin Pao, NP  triamterene -hydrochlorothiazide  (MAXZIDE ) 75-50 MG tablet Take 1 tablet by mouth daily. 11/23/23   Melvin Pao, NP  vitamin B-12 (CYANOCOBALAMIN ) 1000 MCG tablet Take 1,000 mcg by  mouth daily.    [provider]    Physical Exam: Vitals:   05/08/24 1701 05/09/24 0115 05/09/24 0201 05/09/24 0448  BP:   136/75 (!) 118/91  Pulse:  60 (!) 59 86  Resp:   16   Temp:   (!) 97.5 F (36.4 C)   TempSrc:   Oral   SpO2:  97% 96% 97%  Weight: 65.8 kg     Height: 5' 9 (1.753 m)      Physical Exam Vitals and nursing note reviewed.  Constitutional:      General: He is not in acute distress. HENT:     Head: Normocephalic and atraumatic.  Cardiovascular:     Rate and Rhythm: Normal rate and regular rhythm.     Heart sounds: Normal heart sounds.  Pulmonary:     Effort: Pulmonary effort is normal.     Breath sounds: Normal breath sounds.  Abdominal:     Palpations: Abdomen is soft.     Tenderness: There is no abdominal tenderness.  Neurological:     Mental Status: Mental status is at baseline.     Labs on Admission: I have personally reviewed following labs and imaging studies  CBC: Recent Labs  Lab 05/08/24 1704  WBC 6.3  HGB 15.5  HCT 48.6  MCV 80.2  PLT 164   Basic Metabolic Panel: Recent Labs  Lab 05/08/24 1704  NA 143  K 3.5  CL 105  CO2 26  GLUCOSE 107*  BUN 21  CREATININE 1.25*  CALCIUM 9.4   GFR: Estimated Creatinine Clearance: 35.8 mL/min (A) (by C-G formula based on SCr of 1.25 mg/dL (H)). Liver Function Tests: No results for input(s): AST, ALT, ALKPHOS, BILITOT, PROT, ALBUMIN in the last 168 hours. No results for input(s): LIPASE, AMYLASE in the last 168 hours. No results for input(s): AMMONIA in the last 168 hours. Coagulation Profile: No results for input(s): INR, PROTIME in the last 168 hours. Cardiac Enzymes: No results for input(s): CKTOTAL, CKMB, CKMBINDEX, TROPONINI in the last 168 hours. BNP (last 3 results) No results for input(s): PROBNP in the last 8760 hours. HbA1C: No results for input(s): HGBA1C in the last 72 hours. CBG: No results for input(s): GLUCAP in the last  168 hours. Lipid Profile: No results for input(s): CHOL, HDL, LDLCALC, TRIG, CHOLHDL, LDLDIRECT in the last 72 hours. Thyroid  Function Tests: No results for input(s): TSH, T4TOTAL, FREET4, T3FREE, THYROIDAB in the last 72 hours. Anemia Panel: No results for input(s): VITAMINB12, FOLATE, FERRITIN, TIBC, IRON, RETICCTPCT in the last 72 hours. Urine analysis:    Component Value Date/Time   COLORURINE STRAW (A) 02/17/2024 1155   APPEARANCEUR CLEAR (A) 02/17/2024 1155  APPEARANCEUR Clear 07/24/2020 1355   LABSPEC 1.009 02/17/2024 1155   LABSPEC 1.024 05/05/2013 1646   PHURINE 6.0 02/17/2024 1155   GLUCOSEU NEGATIVE 02/17/2024 1155   GLUCOSEU 150 mg/dL 92/95/7985 8353   HGBUR NEGATIVE 02/17/2024 1155   BILIRUBINUR NEGATIVE 02/17/2024 1155   BILIRUBINUR Negative 07/24/2020 1355   BILIRUBINUR Negative 05/05/2013 1646   KETONESUR NEGATIVE 02/17/2024 1155   PROTEINUR NEGATIVE 02/17/2024 1155   NITRITE NEGATIVE 02/17/2024 1155   LEUKOCYTESUR NEGATIVE 02/17/2024 1155   LEUKOCYTESUR Negative 05/05/2013 1646    Radiological Exams on Admission: CT ABDOMEN PELVIS WO CONTRAST Result Date: 05/09/2024 CLINICAL DATA:  Pain after a fall today. Possible foreign body seen on chest radiograph. EXAM: CT ABDOMEN AND PELVIS WITHOUT CONTRAST TECHNIQUE: Multidetector CT imaging of the abdomen and pelvis was performed following the standard protocol without IV contrast. RADIATION DOSE REDUCTION: This exam was performed according to the departmental dose-optimization program which includes automated exposure control, adjustment of the mA and/or kV according to patient size and/or use of iterative reconstruction technique. COMPARISON:  Chest radiograph 05/09/2024. CT abdomen pelvis 02/17/2024 FINDINGS: Lower chest: Atelectasis or infiltration in both lung bases. Metallic foreign body is demonstrated at the EG junction in the distal esophagus. Visualized proximal esophagus is not  significantly dilated. Hepatobiliary: No focal liver abnormality is seen. No gallstones, gallbladder wall thickening, or biliary dilatation. Pancreas: Unremarkable. No pancreatic ductal dilatation or surrounding inflammatory changes. Spleen: Normal in size without focal abnormality. Adrenals/Urinary Tract: No adrenal gland nodules. 7 mm stone in the lower pole left kidney. No hydronephrosis or hydroureter. Cyst in the left kidney measuring 2.5 cm diameter without change. No injury follow-up is indicated. Bladder is normal. Stomach/Bowel: Stomach is within normal limits. Appendix appears normal. No evidence of bowel wall thickening, distention, or inflammatory changes. Vascular/Lymphatic: Aortic atherosclerosis. No enlarged abdominal or pelvic lymph nodes. Reproductive: Prostate is unremarkable. Other: No abdominal wall hernia or abnormality. No abdominopelvic ascites. Musculoskeletal: Degenerative changes in the spine. No destructive bone lesions. IMPRESSION: 1. Metallic foreign body is demonstrated in the distal esophagus just above the EG junction. Visualized distal esophagus is not dilated. 2. Patchy infiltration or atelectasis in the lung bases. 3. 7 mm nonobstructing stone in the lower pole of the left kidney. Electronically Signed   By: Elsie Gravely M.D.   On: 05/09/2024 01:45   DG Chest Port 1 View Result Date: 05/09/2024 CLINICAL DATA:  Cough.  Fell today. EXAM: PORTABLE CHEST 1 VIEW COMPARISON:  08/07/2019 FINDINGS: Heart size and pulmonary vascularity are normal for technique. Mild linear atelectasis in the lung bases. No airspace disease or consolidation is seen. No pleural effusion or pneumothorax. Mediastinal contours appear intact. Degenerative changes in the spine and shoulders. A rounded metallic foreign body is seen projected over the EG junction region. This could represent extrinsic structure or ingested foreign body. Correlate with physical examination. IMPRESSION: No evidence of active  pulmonary disease. Rounded foreign body projecting over the EG junction is probably extrinsic but could represent ingested foreign body. Correlate with physical examination. Electronically Signed   By: Elsie Gravely M.D.   On: 05/09/2024 00:47   CT Head Wo Contrast Result Date: 05/08/2024 CLINICAL DATA:  Head and neck trauma fall EXAM: CT HEAD WITHOUT CONTRAST CT CERVICAL SPINE WITHOUT CONTRAST TECHNIQUE: Multidetector CT imaging of the head and cervical spine was performed following the standard protocol without intravenous contrast. Multiplanar CT image reconstructions of the cervical spine were also generated. RADIATION DOSE REDUCTION: This exam was performed according to the  departmental dose-optimization program which includes automated exposure control, adjustment of the mA and/or kV according to patient size and/or use of iterative reconstruction technique. COMPARISON:  CT 08/07/2019, 12/18/2022 FINDINGS: CT HEAD FINDINGS Brain: No acute territorial infarction, hemorrhage or intracranial mass. Atrophy and chronic small vessel ischemic changes of the white matter. 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 Stable ventricle size Vascular: No hyperdense vessels.  Carotid vascular calcification Skull: Normal. Negative for fracture or focal lesion. Sinuses/Orbits: No acute finding. Other: None CT CERVICAL SPINE FINDINGS Alignment: Straightening of the cervical spine. Trace anterolisthesis C4 on C5. Facet alignment is normal Skull base and vertebrae: No acute fracture. No primary bone lesion or focal pathologic process. Soft tissues and spinal canal: No prevertebral fluid or swelling. No visible canal hematoma. Disc levels: Moderate multilevel degenerative change with diffuse disc space narrowing and osteophyte. Multilevel facet degenerative change with foraminal narrowing. Posterior disc osteophyte with resultant at least mild canal stenosis at C3-C4, C4-C5, with more moderate canal  stenosis at C5-C6 and C6-C7. Upper chest: Lung apices are clear.  Fibrosis at the right apex Other: None IMPRESSION: 1. No CT evidence for acute intracranial abnormality. Atrophy and chronic small vessel ischemic changes of the white matter. Small chronic infarcts as seen on prior exam 2. Straightening of the cervical spine with multilevel degenerative change. No acute osseous abnormality. Electronically Signed   By: Luke Bun M.D.   On: 05/08/2024 23:59   CT Cervical Spine Wo Contrast Result Date: 05/08/2024 CLINICAL DATA:  Head and neck trauma fall EXAM: CT HEAD WITHOUT CONTRAST CT CERVICAL SPINE WITHOUT CONTRAST TECHNIQUE: Multidetector CT imaging of the head and cervical spine was performed following the standard protocol without intravenous contrast. Multiplanar CT image reconstructions of the cervical spine were also generated. RADIATION DOSE REDUCTION: This exam was performed according to the departmental dose-optimization program which includes automated exposure control, adjustment of the mA and/or kV according to patient size and/or use of iterative reconstruction technique. COMPARISON:  CT 08/07/2019, 12/18/2022 FINDINGS: CT HEAD FINDINGS Brain: No acute territorial infarction, hemorrhage or intracranial mass. Atrophy and chronic small vessel ischemic changes of the white matter. 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 Stable ventricle size Vascular: No hyperdense vessels.  Carotid vascular calcification Skull: Normal. Negative for fracture or focal lesion. Sinuses/Orbits: No acute finding. Other: None CT CERVICAL SPINE FINDINGS Alignment: Straightening of the cervical spine. Trace anterolisthesis C4 on C5. Facet alignment is normal Skull base and vertebrae: No acute fracture. No primary bone lesion or focal pathologic process. Soft tissues and spinal canal: No prevertebral fluid or swelling. No visible canal hematoma. Disc levels: Moderate multilevel degenerative  change with diffuse disc space narrowing and osteophyte. Multilevel facet degenerative change with foraminal narrowing. Posterior disc osteophyte with resultant at least mild canal stenosis at C3-C4, C4-C5, with more moderate canal stenosis at C5-C6 and C6-C7. Upper chest: Lung apices are clear.  Fibrosis at the right apex Other: None IMPRESSION: 1. No CT evidence for acute intracranial abnormality. Atrophy and chronic small vessel ischemic changes of the white matter. Small chronic infarcts as seen on prior exam 2. Straightening of the cervical spine with multilevel degenerative change. No acute osseous abnormality. Electronically Signed   By: Luke Bun M.D.   On: 05/08/2024 23:59   Data Reviewed for HPI: Relevant notes from primary care and specialist visits, past discharge summaries as available in EHR, including Care Everywhere. Prior diagnostic testing as pertinent to current admission  diagnoses Updated medications and problem lists for reconciliation ED course, including vitals, labs, imaging, treatment and response to treatment Triage notes, nursing and pharmacy notes and ED provider's notes Notable results as noted above in HPI      Assessment and Plan: * Foreign body of esophagus, initial encounter N.p.o. Formal GI consult placed for EGD in the a.m.  Fall at home, initial encounter According to son, there was no loss of consciousness suspected by family PT eval Cardiac monitoring overnight  Alzheimer's dementia without behavioral disturbance (HCC) Delirium precautions Hold home meds until following EGD  HTN (hypertension) Hydralazine  IV as needed while n.p.o.        DVT prophylaxis: scd  Consults: GI, Dr. Therisa  Advance Care Planning:   Code Status: Prior   Family Communication: Son at bedside  Disposition Plan: Back to previous home environment  Severity of Illness: The appropriate patient status for this patient is OBSERVATION. Observation status is  judged to be reasonable and necessary in order to provide the required intensity of service to ensure the patient's safety. The patient's presenting symptoms, physical exam findings, and initial radiographic and laboratory data in the context of their medical condition is felt to place them at decreased risk for further clinical deterioration. Furthermore, it is anticipated that the patient will be medically stable for discharge from the hospital within 2 midnights of admission.   Author: Delayne LULLA Solian, MD 05/09/2024 5:05 AM  For on call review [email or web address].

## 2024-05-09 NOTE — ED Notes (Signed)
 This rn attempted to start IV, pt refused stating I don't need that

## 2024-05-09 NOTE — Assessment & Plan Note (Signed)
 Delirium precautions Hold home meds until following EGD

## 2024-05-09 NOTE — Transfer of Care (Signed)
 Immediate Anesthesia Transfer of Care Note  [name]  Procedure(s) Performed: EGD (ESOPHAGOGASTRODUODENOSCOPY)  Patient Location: Endoscopy Unit  Anesthesia Type:General  Level of Consciousness: drowsy  Airway & Oxygen Therapy: Patient Spontanous Breathing and Patient connected to face mask oxygen  Post-op Assessment: Report given to RN, Post -op Vital signs reviewed and stable, and Patient moving all extremities  Post vital signs: Reviewed and stable  Last Vitals:  Vitals Value Taken Time  BP 128/83 05/09/24 11:20  Temp 35.4 C 05/09/24 11:19  Pulse 55 05/09/24 11:25  Resp 17 05/09/24 11:24  SpO2 100 % 05/09/24 11:25  Vitals shown include unfiled device data.  Last Pain:  Vitals:   05/09/24 1119  TempSrc: Temporal  PainSc: Asleep         Complications: No notable events documented.

## 2024-05-10 ENCOUNTER — Encounter: Payer: Self-pay | Admitting: Gastroenterology

## 2024-05-10 ENCOUNTER — Observation Stay (HOSPITAL_BASED_OUTPATIENT_CLINIC_OR_DEPARTMENT_OTHER)
Admit: 2024-05-10 | Discharge: 2024-05-10 | Disposition: A | Discharge status: Home / Self Care | Attending: Gastroenterology | Admitting: Gastroenterology

## 2024-05-10 DIAGNOSIS — I10 Essential (primary) hypertension: Secondary | ICD-10-CM

## 2024-05-10 DIAGNOSIS — R55 Syncope and collapse: Secondary | ICD-10-CM

## 2024-05-10 DIAGNOSIS — T18108A Unspecified foreign body in esophagus causing other injury, initial encounter: Secondary | ICD-10-CM | POA: Diagnosis not present

## 2024-05-10 DIAGNOSIS — G309 Alzheimer's disease, unspecified: Secondary | ICD-10-CM | POA: Diagnosis not present

## 2024-05-10 DIAGNOSIS — F0280 Dementia in other diseases classified elsewhere without behavioral disturbance: Secondary | ICD-10-CM

## 2024-05-10 LAB — GLUCOSE, CAPILLARY: Glucose-Capillary: 147 mg/dL — ABNORMAL HIGH (ref 70–99)

## 2024-05-10 LAB — BASIC METABOLIC PANEL WITH GFR
Anion gap: 12 (ref 5–15)
BUN: 25 mg/dL — ABNORMAL HIGH (ref 8–23)
CO2: 23 mmol/L (ref 22–32)
Calcium: 8.8 mg/dL — ABNORMAL LOW (ref 8.9–10.3)
Chloride: 105 mmol/L (ref 98–111)
Creatinine, Ser: 1.24 mg/dL (ref 0.61–1.24)
GFR, Estimated: 55 mL/min — ABNORMAL LOW (ref >60)
Glucose, Bld: 141 mg/dL — ABNORMAL HIGH (ref 70–99)
Potassium: 3.5 mmol/L (ref 3.5–5.1)
Sodium: 140 mmol/L (ref 135–145)

## 2024-05-10 LAB — ECHOCARDIOGRAM COMPLETE
AR max vel: 2.4 cm2
AV Area VTI: 2.36 cm2
AV Area mean vel: 2.32 cm2
AV Mean grad: 3 mmHg
AV Peak grad: 6.2 mmHg
Ao pk vel: 1.24 m/s
Area-P 1/2: 3.48 cm2
Calc EF: 70.4 %
Height: 69 in
MV VTI: 2.23 cm2
P 1/2 time: 422 ms
S' Lateral: 3.25 cm
Single Plane A2C EF: 76.4 %
Single Plane A4C EF: 61.3 %
Weight: 2264.57 [oz_av]

## 2024-05-10 LAB — VITAMIN B12: Vitamin B-12: 879 pg/mL (ref 180–914)

## 2024-05-10 MED ORDER — POTASSIUM CHLORIDE 20 MEQ PO PACK
40.0000 meq | PACK | Freq: Once | ORAL | Status: AC
  Administered 2024-05-10: 40 meq via ORAL
  Filled 2024-05-10: qty 2

## 2024-05-10 NOTE — Care Management Obs Status (Signed)
 MEDICARE OBSERVATION STATUS NOTIFICATION   Patient Details  Name: Ian Parker MRN: [id number] Date of Birth: Mar 10, 1931   Medicare Observation Status Notification Given:  Yes    Rojelio SHAUNNA Rattler 05/10/2024, 10:59 AM

## 2024-05-10 NOTE — Evaluation (Signed)
 Physical Therapy Evaluation Patient Details Name: Ian Parker MRN: [id number] DOB: [birth year] Today's Date: 05/10/2024  History of Present Illness  Pt is a 89 yo male that presented to the ED for a fall, noted for cough and foreign body in his esophagus, s/p EGD with removal of a penny. PMH of CKD, dementia, HTN.  Clinical Impression  Patient alert, agreeable to PT, oriented to self and family in room only (family endorsed this is baseline for him, orientation fluctuates). Wife stated he is modI for dressing, supervision for transfers in/out of shower but no physical assistance needed, ambulatory without AD and no other falls. He was able to perform bed mobility and transfers with supervision, and progressed to ambulating with supervision as well, no AD, no LOB. The patient demonstrated and reported return to baseline level of functioning, no further acute PT needs indicated. PT to sign off. Please reconsult PT if pt status changes or acute needs are identified.          If plan is discharge home, recommend the following: Assist for transportation;Supervision due to cognitive status;Help with stairs or ramp for entrance   Can travel by private vehicle        Equipment Recommendations None recommended by PT  Recommendations for Other Services       Functional Status Assessment Patient has not had a recent decline in their functional status     Precautions / Restrictions Precautions Precautions: Fall Restrictions Weight Bearing Restrictions Per Provider Order: No      Mobility  Bed Mobility Overal bed mobility: Needs Assistance Bed Mobility: Supine to Sit, Sit to Supine     Supine to sit: Supervision Sit to supine: Supervision        Transfers Overall transfer level: Needs assistance Equipment used: None Transfers: Sit to/from Stand Sit to Stand: Supervision                Ambulation/Gait Ambulation/Gait assistance: Contact guard assist, Supervision Gait  Distance (Feet): 160 Feet Assistive device: None         General Gait Details: progressed to supervision  Stairs            Wheelchair Mobility     Tilt Bed    Modified Rankin (Stroke Patients Only)       Balance Overall balance assessment: Needs assistance Sitting-balance support: Feet supported Sitting balance-Leahy Scale: Good       Standing balance-Leahy Scale: Good                               Pertinent Vitals/Pain Pain Assessment Pain Assessment: Faces Faces Pain Scale: No hurt    Home Living Family/patient expects to be discharged to:: Private residence Living Arrangements: Spouse/significant other Available Help at Discharge: Family Type of Home: House Home Access: Stairs to enter Entrance Stairs-Rails: Can reach both Entrance Stairs-Number of Steps: 2   Home Layout: One level Home Equipment: Shower seat;Grab bars - tub/shower      Prior Function Prior Level of Function : Independent/Modified Independent             Mobility Comments: ambulatory without AD ADLs Comments: wife endorsed dressing modI, supervision to get/in out of shower but able to do so without assistance     Extremity/Trunk Assessment   Upper Extremity Assessment Upper Extremity Assessment: Overall WFL for tasks assessed    Lower Extremity Assessment Lower Extremity Assessment: Overall WFL for tasks assessed  Communication   Communication Factors Affecting Communication: Hearing impaired    Cognition Arousal: Alert Behavior During Therapy: WFL for tasks assessed/performed   PT - Cognitive impairments: History of cognitive impairments                       PT - Cognition Comments: pt oriented to self and family Following commands: Intact       Cueing Cueing Techniques: Tactile cues, Visual cues     General Comments      Exercises     Assessment/Plan    PT Assessment Patient does not need any further PT services  PT  Problem List         PT Treatment Interventions      PT Goals (Current goals can be found in the Care Plan section)       Frequency       Co-evaluation               AM-PAC PT 6 Clicks Mobility  Outcome Measure Help needed turning from your back to your side while in a flat bed without using bedrails?: None Help needed moving from lying on your back to sitting on the side of a flat bed without using bedrails?: None Help needed moving to and from a bed to a chair (including a wheelchair)?: None Help needed standing up from a chair using your arms (e.g., wheelchair or bedside chair)?: None Help needed to walk in hospital room?: None Help needed climbing 3-5 steps with a railing? : None 6 Click Score: 24    End of Session Equipment Utilized During Treatment: Gait belt Activity Tolerance: Patient tolerated treatment well Patient left: in bed;with call bell/phone within reach;with family/visitor present;with bed alarm set Nurse Communication: Mobility status      Time: 1012-1029 PT Time Calculation (min) (ACUTE ONLY): 17 min   Charges:   PT Evaluation $PT Eval Low Complexity: 1 Low PT Treatments $Therapeutic Activity: 8-22 mins PT General Charges $$ ACUTE PT VISIT: 1 Visit        Doyal Shams PT, DPT 10:39 AM,05/10/24

## 2024-05-10 NOTE — Discharge Summary (Signed)
 Physician Discharge Summary   [name] MRN: [id number] DOB: Apr 08, 1931  Admit date:     05/08/2024  Discharge date: 05/10/24  Discharge Physician: Murvin Mana   PCP: Melvin Pao, NP   Recommendations at discharge:   PCP in 1 week  Discharge Diagnoses: Principal Problem:   Foreign body of esophagus, initial encounter Active Problems:   HTN (hypertension)   Alzheimer's dementia without behavioral disturbance (HCC)   Fall at home, initial encounter  Resolved Problems:   * No resolved hospital problems. *  Hospital Course: Ian Parker is a 89 y.o. male with medical history significant for Dementia, HTN and BPH, being admitted for foreign metallic object in esophagus associated with coughing and discomfort for the past week.  Patient and initially presented for an unwitnessed fall.  CT abdomen/pelvis showed a foreign body in the distal esophagus, EGD was performed, removed a penny. Patient has been well since the procedure.  Medically stable for discharge  Assessment and Plan: * Foreign body of esophagus, initial encounter S/p EGD and a removal of a penny. Patient has resolved.  Fall at home, initial encounter According to son, there was no loss of consciousness suspected by family Seen by PT, did well, no need for home PT  Alzheimer's dementia without behavioral disturbance (HCC) Delirium precautions Start home medicines.  HTN (hypertension) Hypokalemia Resume home treatment, patient also on potassium chronically. Replete potassium in the hospital, resumed home dose potassium        Consultants: GI Procedures performed: EGD  Disposition: Home Diet recommendation:  Discharge Diet Orders (From admission, onward)     Start     Ordered   05/10/24 0000  Diet - low sodium heart healthy        05/10/24 1028           Cardiac diet DISCHARGE MEDICATION: Allergies as of 05/10/2024       Reactions   Ace Inhibitors    Peanuts [peanut Oil]     Penicillins    Sulfa Antibiotics         Medication List     TAKE these medications    amLODipine  10 MG tablet Commonly known as: NORVASC  Take 1 tablet (10 mg total) by mouth daily.   cholecalciferol  1000 units tablet Commonly known as: VITAMIN D  Take 1,000 Units by mouth daily. Two tablets once daily   cyanocobalamin  1000 MCG tablet Commonly known as: VITAMIN B12 Take 1,000 mcg by mouth daily.   galantamine  8 MG 24 hr capsule Commonly known as: RAZADYNE  ER Take 1 capsule (8 mg total) by mouth daily with breakfast.   Klor-Con  M20 20 MEQ tablet Generic drug: potassium chloride  SA Take 2 tablets by mouth twice daily   loratadine 10 MG tablet Commonly known as: CLARITIN Take 10 mg by mouth daily.   memantine  10 MG tablet Commonly known as: NAMENDA  Take 1 tablet (10 mg total) by mouth 2 (two) times daily.   mirtazapine  7.5 MG tablet Commonly known as: REMERON  Take 1 tablet (7.5 mg total) by mouth at bedtime.   triamterene -hydrochlorothiazide  75-50 MG tablet Commonly known as: MAXZIDE  Take 1 tablet by mouth daily.        Follow-up Information     Melvin Pao, NP Follow up in 1 week(s).   Specialty: Nurse Practitioner Contact information: [address] [place] [address] [phone]                Discharge Exam: Fredricka Weights   05/08/24 1701  05/09/24 1015 05/10/24 0500  Weight: 65.8 kg 65.8 kg 64.2 kg   General exam: Appears calm and comfortable  Respiratory system: Clear to auscultation. Respiratory effort normal. Cardiovascular system: S1 & S2 heard, RRR. No JVD, murmurs, rubs, gallops or clicks. No pedal edema. Gastrointestinal system: Abdomen is nondistended, soft and nontender. No organomegaly or masses felt. Normal bowel sounds heard. Central nervous system: Alert and oriented x1. No focal neurological deficits. Extremities: Symmetric 5 x 5 power. Skin: No rashes, lesions or ulcers Psychiatry:  Mood & affect appropriate.     Condition at discharge: good  The results of significant diagnostics from this hospitalization (including imaging, microbiology, ancillary and laboratory) are listed below for reference.   Imaging Studies: CT ABDOMEN PELVIS WO CONTRAST Result Date: 05/09/2024 CLINICAL DATA:  Pain after a fall today. Possible foreign body seen on chest radiograph. EXAM: CT ABDOMEN AND PELVIS WITHOUT CONTRAST TECHNIQUE: Multidetector CT imaging of the abdomen and pelvis was performed following the standard protocol without IV contrast. RADIATION DOSE REDUCTION: This exam was performed according to the departmental dose-optimization program which includes automated exposure control, adjustment of the mA and/or kV according to patient size and/or use of iterative reconstruction technique. COMPARISON:  Chest radiograph 05/09/2024. CT abdomen pelvis 02/17/2024 FINDINGS: Lower chest: Atelectasis or infiltration in both lung bases. Metallic foreign body is demonstrated at the EG junction in the distal esophagus. Visualized proximal esophagus is not significantly dilated. Hepatobiliary: No focal liver abnormality is seen. No gallstones, gallbladder wall thickening, or biliary dilatation. Pancreas: Unremarkable. No pancreatic ductal dilatation or surrounding inflammatory changes. Spleen: Normal in size without focal abnormality. Adrenals/Urinary Tract: No adrenal gland nodules. 7 mm stone in the lower pole left kidney. No hydronephrosis or hydroureter. Cyst in the left kidney measuring 2.5 cm diameter without change. No injury follow-up is indicated. Bladder is normal. Stomach/Bowel: Stomach is within normal limits. Appendix appears normal. No evidence of bowel wall thickening, distention, or inflammatory changes. Vascular/Lymphatic: Aortic atherosclerosis. No enlarged abdominal or pelvic lymph nodes. Reproductive: Prostate is unremarkable. Other: No abdominal wall hernia or abnormality. No abdominopelvic ascites. Musculoskeletal:  Degenerative changes in the spine. No destructive bone lesions. IMPRESSION: 1. Metallic foreign body is demonstrated in the distal esophagus just above the EG junction. Visualized distal esophagus is not dilated. 2. Patchy infiltration or atelectasis in the lung bases. 3. 7 mm nonobstructing stone in the lower pole of the left kidney. Electronically Signed   By: Elsie Gravely M.D.   On: 05/09/2024 01:45   DG Chest Port 1 View Result Date: 05/09/2024 CLINICAL DATA:  Cough.  Fell today. EXAM: PORTABLE CHEST 1 VIEW COMPARISON:  08/07/2019 FINDINGS: Heart size and pulmonary vascularity are normal for technique. Mild linear atelectasis in the lung bases. No airspace disease or consolidation is seen. No pleural effusion or pneumothorax. Mediastinal contours appear intact. Degenerative changes in the spine and shoulders. A rounded metallic foreign body is seen projected over the EG junction region. This could represent extrinsic structure or ingested foreign body. Correlate with physical examination. IMPRESSION: No evidence of active pulmonary disease. Rounded foreign body projecting over the EG junction is probably extrinsic but could represent ingested foreign body. Correlate with physical examination. Electronically Signed   By: Elsie Gravely M.D.   On: 05/09/2024 00:47   CT Head Wo Contrast Result Date: 05/08/2024 CLINICAL DATA:  Head and neck trauma fall EXAM: CT HEAD WITHOUT CONTRAST CT CERVICAL SPINE WITHOUT CONTRAST TECHNIQUE: Multidetector CT imaging of the head and cervical spine was performed following  the standard protocol without intravenous contrast. Multiplanar CT image reconstructions of the cervical spine were also generated. RADIATION DOSE REDUCTION: This exam was performed according to the departmental dose-optimization program which includes automated exposure control, adjustment of the mA and/or kV according to patient size and/or use of iterative reconstruction technique. COMPARISON:  CT  08/07/2019, 12/18/2022 FINDINGS: CT HEAD FINDINGS Brain: No acute territorial infarction, hemorrhage or intracranial mass. Atrophy and chronic small vessel ischemic changes of the white matter. 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 Stable ventricle size Vascular: No hyperdense vessels.  Carotid vascular calcification Skull: Normal. Negative for fracture or focal lesion. Sinuses/Orbits: No acute finding. Other: None CT CERVICAL SPINE FINDINGS Alignment: Straightening of the cervical spine. Trace anterolisthesis C4 on C5. Facet alignment is normal Skull base and vertebrae: No acute fracture. No primary bone lesion or focal pathologic process. Soft tissues and spinal canal: No prevertebral fluid or swelling. No visible canal hematoma. Disc levels: Moderate multilevel degenerative change with diffuse disc space narrowing and osteophyte. Multilevel facet degenerative change with foraminal narrowing. Posterior disc osteophyte with resultant at least mild canal stenosis at C3-C4, C4-C5, with more moderate canal stenosis at C5-C6 and C6-C7. Upper chest: Lung apices are clear.  Fibrosis at the right apex Other: None IMPRESSION: 1. No CT evidence for acute intracranial abnormality. Atrophy and chronic small vessel ischemic changes of the white matter. Small chronic infarcts as seen on prior exam 2. Straightening of the cervical spine with multilevel degenerative change. No acute osseous abnormality. Electronically Signed   By: Luke Bun M.D.   On: 05/08/2024 23:59   CT Cervical Spine Wo Contrast Result Date: 05/08/2024 CLINICAL DATA:  Head and neck trauma fall EXAM: CT HEAD WITHOUT CONTRAST CT CERVICAL SPINE WITHOUT CONTRAST TECHNIQUE: Multidetector CT imaging of the head and cervical spine was performed following the standard protocol without intravenous contrast. Multiplanar CT image reconstructions of the cervical spine were also generated. RADIATION DOSE REDUCTION: This exam was  performed according to the departmental dose-optimization program which includes automated exposure control, adjustment of the mA and/or kV according to patient size and/or use of iterative reconstruction technique. COMPARISON:  CT 08/07/2019, 12/18/2022 FINDINGS: CT HEAD FINDINGS Brain: No acute territorial infarction, hemorrhage or intracranial mass. Atrophy and chronic small vessel ischemic changes of the white matter. 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 Stable ventricle size Vascular: No hyperdense vessels.  Carotid vascular calcification Skull: Normal. Negative for fracture or focal lesion. Sinuses/Orbits: No acute finding. Other: None CT CERVICAL SPINE FINDINGS Alignment: Straightening of the cervical spine. Trace anterolisthesis C4 on C5. Facet alignment is normal Skull base and vertebrae: No acute fracture. No primary bone lesion or focal pathologic process. Soft tissues and spinal canal: No prevertebral fluid or swelling. No visible canal hematoma. Disc levels: Moderate multilevel degenerative change with diffuse disc space narrowing and osteophyte. Multilevel facet degenerative change with foraminal narrowing. Posterior disc osteophyte with resultant at least mild canal stenosis at C3-C4, C4-C5, with more moderate canal stenosis at C5-C6 and C6-C7. Upper chest: Lung apices are clear.  Fibrosis at the right apex Other: None IMPRESSION: 1. No CT evidence for acute intracranial abnormality. Atrophy and chronic small vessel ischemic changes of the white matter. Small chronic infarcts as seen on prior exam 2. Straightening of the cervical spine with multilevel degenerative change. No acute osseous abnormality. Electronically Signed   By: Luke Bun M.D.   On: 05/08/2024 23:59    Microbiology: Results  for orders placed or performed in visit on 07/24/20  Microscopic Examination     Status: None   Collection Time: 07/24/20  1:55 PM   Urine  Result Value Ref Range Status    WBC, UA 0-5 0 - 5 /hpf Final   RBC, Urine None seen 0 - 2 /hpf Final   Epithelial Cells (non renal) 0-10 0 - 10 /hpf Final   Bacteria, UA None seen None seen/Few Final    Labs: CBC: Recent Labs  Lab 05/08/24 1704  WBC 6.3  HGB 15.5  HCT 48.6  MCV 80.2  PLT 164   Basic Metabolic Panel: Recent Labs  Lab 05/08/24 1704 05/09/24 1449 05/10/24 0506  NA 143 141 140  K 3.5 3.3* 3.5  CL 105 104 105  CO2 26 28 23   GLUCOSE 107* 143* 141*  BUN 21 17 25*  CREATININE 1.25* 0.99 1.24  CALCIUM 9.4 9.1 8.8*   Liver Function Tests: No results for input(s): AST, ALT, ALKPHOS, BILITOT, PROT, ALBUMIN in the last 168 hours. CBG: Recent Labs  Lab 05/09/24 1207 05/10/24 0432  GLUCAP 136* 147*    Discharge time spent: greater than 30 minutes.  Signed: Murvin Mana, MD [facility] 05/10/2024

## 2024-05-10 NOTE — Progress Notes (Signed)
 Transition of Care [facility]) - Inpatient Brief Assessment   Patient Details  Name: Ian Parker MRN: [id number] Date of Birth: 1931-01-27  [facility]) CM/SW Contact:    [name] C [name], RN Phone Number: 05/10/2024, 11:20 AM   Clinical Narrative: TOC continuing to follow patient's progress throughout discharge planning.   Transition of Care Asessment: Insurance and Status: Insurance coverage has been reviewed Patient has primary care physician: Yes   Prior level of function:: Independent Prior/Current Home Services: No current home services Social Drivers of Health Review: SDOH reviewed no interventions necessary Readmission risk has been reviewed: Yes Transition of care needs: no transition of care needs at this time

## 2024-05-10 NOTE — Plan of Care (Signed)

## 2024-05-18 ENCOUNTER — Ambulatory Visit (INDEPENDENT_AMBULATORY_CARE_PROVIDER_SITE_OTHER): Admitting: Nurse Practitioner

## 2024-05-18 ENCOUNTER — Encounter: Payer: Self-pay | Admitting: Nurse Practitioner

## 2024-05-18 VITALS — BP 119/68 | HR 77 | Temp 97.6°F | Wt 166.8 lb

## 2024-05-18 DIAGNOSIS — T18108S Unspecified foreign body in esophagus causing other injury, sequela: Secondary | ICD-10-CM

## 2024-05-18 DIAGNOSIS — Z09 Encounter for follow-up examination after completed treatment for conditions other than malignant neoplasm: Secondary | ICD-10-CM

## 2024-05-18 NOTE — Assessment & Plan Note (Signed)
 Ian Parker was removed on EGD.  Patient is doing well.  Denies concerns ore residual concerns with cough.

## 2024-05-18 NOTE — Progress Notes (Signed)
 BP 119/68   Pulse 77   Temp 97.6 F (36.4 C) (Oral)   Wt 166 lb 12.8 oz (75.7 kg)   SpO2 97%   BMI 24.63 kg/m    Subjective:    Patient ID: Ian Parker, male    DOB: 11/16/1930, 89 y.o.   MRN: [id number]  HPI: [name] is a 89 y.o. male  Chief Complaint  Patient presents with   Hospitalization Follow-up   Transition of Care Hospital Follow up.   Hospital/Facility: [facility] D/C Physician: Dr. Laurita D/C Date: 05/10/24  Records Requested: NA Records Received: NA Records Reviewed: Yes  Diagnoses on Discharge:    Foreign body of esophagus, initial encounter S/p EGD and a removal of a penny. Patient has resolved.   Fall at home, initial encounter According to son, there was no loss of consciousness suspected by family Seen by PT, did well, no need for home PT   Alzheimer's dementia without behavioral disturbance (HCC) Delirium precautions Start home medicines.   HTN (hypertension) Hypokalemia Resume home treatment, patient also on potassium chronically. Replete potassium in the hospital, resumed home dose potassium  Date of interactive Contact within 48 hours of discharge:  Contact was through: none  Date of 7 day or 14 day face-to-face visit:    within 14 days  Outpatient Encounter Medications as of 05/18/2024  Medication Sig   amLODipine  (NORVASC ) 10 MG tablet Take 1 tablet (10 mg total) by mouth daily.   cholecalciferol  (VITAMIN D ) 1000 UNITS tablet Take 1,000 Units by mouth daily. Two tablets once daily   galantamine  (RAZADYNE  ER) 8 MG 24 hr capsule Take 1 capsule (8 mg total) by mouth daily with breakfast.   KLOR-CON  M20 20 MEQ tablet Take 2 tablets by mouth twice daily   loratadine (CLARITIN) 10 MG tablet Take 10 mg by mouth daily.   memantine  (NAMENDA ) 10 MG tablet Take 1 tablet (10 mg total) by mouth 2 (two) times daily.   mirtazapine  (REMERON ) 7.5 MG tablet Take 1 tablet (7.5 mg total) by mouth at bedtime.   triamterene -hydrochlorothiazide   (MAXZIDE ) 75-50 MG tablet Take 1 tablet by mouth daily.   vitamin B-12 (CYANOCOBALAMIN ) 1000 MCG tablet Take 1,000 mcg by mouth daily.   No facility-administered encounter medications on file as of 05/18/2024.    Diagnostic Tests Reviewed/Disposition: Reviewed  Consults: Reviewed  Discharge Instructions: Reviewed with the family  Disease/illness Education: Discussed with family  Home Health/Community Services Discussions/Referrals: NA  Establishment or re-establishment of referral orders for community resources: NA  Discussion with other health care providers: NA  Assessment and Support of treatment regimen adherence: Provided during visit  Appointments Coordinated with: Patient's family  Education for self-management, independent living, and ADLs: Provided during visit  Relevant past medical, surgical, family and social history reviewed and updated as indicated. Interim medical history since our last visit reviewed. Allergies and medications reviewed and updated.  Review of Systems  All other systems reviewed and are negative.   Per HPI unless specifically indicated above     Objective:    BP 119/68   Pulse 77   Temp 97.6 F (36.4 C) (Oral)   Wt 166 lb 12.8 oz (75.7 kg)   SpO2 97%   BMI 24.63 kg/m   Wt Readings from Last 3 Encounters:  05/18/24 166 lb 12.8 oz (75.7 kg)  05/10/24 141 lb 8.6 oz (64.2 kg)  02/17/24 168 lb 3.4 oz (76.3 kg)    Physical Exam Vitals and nursing note reviewed.  Constitutional:      General: He is not in acute distress.    Appearance: Normal appearance. He is not ill-appearing, toxic-appearing or diaphoretic.  HENT:     Head: Normocephalic.     Right Ear: External ear normal.     Left Ear: External ear normal.     Nose: Nose normal. No congestion or rhinorrhea.     Mouth/Throat:     Mouth: Mucous membranes are moist.  Eyes:     General:        Right eye: No discharge.        Left eye: No discharge.     Extraocular Movements:  Extraocular movements intact.     Conjunctiva/sclera: Conjunctivae normal.     Pupils: Pupils are equal, round, and reactive to light.  Cardiovascular:     Rate and Rhythm: Normal rate and regular rhythm.     Heart sounds: No murmur heard. Pulmonary:     Effort: Pulmonary effort is normal. No respiratory distress.     Breath sounds: Normal breath sounds. No wheezing, rhonchi or rales.  Abdominal:     General: Abdomen is flat. Bowel sounds are normal.  Musculoskeletal:     Cervical back: Normal range of motion and neck supple.  Skin:    General: Skin is warm and dry.     Capillary Refill: Capillary refill takes less than 2 seconds.  Neurological:     General: No focal deficit present.     Mental Status: He is alert and oriented to person, place, and time.  Psychiatric:        Mood and Affect: Mood normal.        Behavior: Behavior normal.        Thought Content: Thought content normal.        Judgment: Judgment normal.     Results for orders placed or performed during the hospital encounter of 05/08/24  CBC   Collection Time: 05/08/24  5:04 PM  Result Value Ref Range   WBC 6.3 4.0 - 10.5 K/uL   RBC 6.06 (H) 4.22 - 5.81 MIL/uL   Hemoglobin 15.5 13.0 - 17.0 g/dL   HCT 51.3 60.9 - 47.9 %   MCV 80.2 80.0 - 100.0 fL   MCH 25.6 (L) 26.0 - 34.0 pg   MCHC 31.9 30.0 - 36.0 g/dL   RDW 85.0 88.4 - 84.4 %   Platelets 164 150 - 400 K/uL   nRBC 0.0 0.0 - 0.2 %  Basic metabolic panel   Collection Time: 05/08/24  5:04 PM  Result Value Ref Range   Sodium 143 135 - 145 mmol/L   Potassium 3.5 3.5 - 5.1 mmol/L   Chloride 105 98 - 111 mmol/L   CO2 26 22 - 32 mmol/L   Glucose, Bld 107 (H) 70 - 99 mg/dL   BUN 21 8 - 23 mg/dL   Creatinine, Ser 8.74 (H) 0.61 - 1.24 mg/dL   Calcium 9.4 8.9 - 89.6 mg/dL   GFR, Estimated 54 (L) >60 mL/min   Anion gap 12 5 - 15  Troponin I (High Sensitivity)   Collection Time: 05/08/24  5:04 PM  Result Value Ref Range   Troponin I (High Sensitivity) 8 <18  ng/L  CBG monitoring, ED   Collection Time: 05/09/24 12:07 PM  Result Value Ref Range   Glucose-Capillary 136 (H) 70 - 99 mg/dL  Basic metabolic panel   Collection Time: 05/09/24  2:49 PM  Result Value Ref Range   Sodium 141  135 - 145 mmol/L   Potassium 3.3 (L) 3.5 - 5.1 mmol/L   Chloride 104 98 - 111 mmol/L   CO2 28 22 - 32 mmol/L   Glucose, Bld 143 (H) 70 - 99 mg/dL   BUN 17 8 - 23 mg/dL   Creatinine, Ser 9.00 0.61 - 1.24 mg/dL   Calcium 9.1 8.9 - 89.6 mg/dL   GFR, Estimated >39 >39 mL/min   Anion gap 9 5 - 15  Troponin I (High Sensitivity)   Collection Time: 05/09/24  2:49 PM  Result Value Ref Range   Troponin I (High Sensitivity) 7 <18 ng/L  Glucose, capillary   Collection Time: 05/10/24  4:32 AM  Result Value Ref Range   Glucose-Capillary 147 (H) 70 - 99 mg/dL  Basic metabolic panel with GFR   Collection Time: 05/10/24  5:06 AM  Result Value Ref Range   Sodium 140 135 - 145 mmol/L   Potassium 3.5 3.5 - 5.1 mmol/L   Chloride 105 98 - 111 mmol/L   CO2 23 22 - 32 mmol/L   Glucose, Bld 141 (H) 70 - 99 mg/dL   BUN 25 (H) 8 - 23 mg/dL   Creatinine, Ser 8.75 0.61 - 1.24 mg/dL   Calcium 8.8 (L) 8.9 - 10.3 mg/dL   GFR, Estimated 55 (L) >60 mL/min   Anion gap 12 5 - 15  Vitamin B12   Collection Time: 05/10/24  5:06 AM  Result Value Ref Range   Vitamin B-12 879 180 - 914 pg/mL  ECHOCARDIOGRAM COMPLETE   Collection Time: 05/10/24  9:00 AM  Result Value Ref Range   Weight 2,264.57 oz   Height 69 in   BP 102/56 mmHg   Ao pk vel 1.24 m/s   AV Area VTI 2.36 cm2   AR max vel 2.40 cm2   AV Mean grad 3.0 mmHg   AV Peak grad 6.2 mmHg   Single Plane A2C EF 76.4 %   Single Plane A4C EF 61.3 %   Calc EF 70.4 %   S' Lateral 3.25 cm   AV Area mean vel 2.32 cm2   Area-P 1/2 3.48 cm2   P 1/2 time 422 msec   MV VTI 2.23 cm2   Est EF 55 - 60%       Assessment & Plan:   Problem List Items Addressed This Visit       Digestive   Foreign body of esophagus, sequela   [name]  was removed on EGD.  Patient is doing well.  Denies concerns ore residual concerns with cough.       Relevant Orders   Comp Met (CMET)   Other Visit Diagnoses       Hospital discharge follow-up    -  Primary   No TOC call completed.  Unable to complete HFU billing. However, patient is doing well with no residual concerns.  Recheck potassium today.   Relevant Orders   Comp Met (CMET)        Follow up plan: No follow-ups on file.

## 2024-05-19 ENCOUNTER — Ambulatory Visit: Payer: Self-pay | Admitting: Nurse Practitioner

## 2024-05-19 LAB — COMPREHENSIVE METABOLIC PANEL WITH GFR
ALT: 21 IU/L (ref 0–44)
AST: 15 IU/L (ref 0–40)
Albumin: 4.4 g/dL (ref 3.6–4.6)
Alkaline Phosphatase: 73 IU/L (ref 44–121)
BUN/Creatinine Ratio: 14 (ref 10–24)
BUN: 18 mg/dL (ref 10–36)
Bilirubin Total: 0.3 mg/dL (ref 0.0–1.2)
CO2: 22 mmol/L (ref 20–29)
Calcium: 9.2 mg/dL (ref 8.6–10.2)
Chloride: 103 mmol/L (ref 96–106)
Creatinine, Ser: 1.29 mg/dL — ABNORMAL HIGH (ref 0.76–1.27)
Globulin, Total: 2.4 g/dL (ref 1.5–4.5)
Glucose: 96 mg/dL (ref 70–99)
Potassium: 4.2 mmol/L (ref 3.5–5.2)
Sodium: 141 mmol/L (ref 134–144)
Total Protein: 6.8 g/dL (ref 6.0–8.5)
eGFR: 52 mL/min/1.73 — ABNORMAL LOW (ref >59)

## 2024-06-06 ENCOUNTER — Other Ambulatory Visit: Payer: Self-pay | Admitting: Nurse Practitioner

## 2024-06-08 NOTE — Telephone Encounter (Signed)
 Requested Prescriptions  Pending Prescriptions Disp Refills   memantine  (NAMENDA ) 10 MG tablet [Pharmacy Med Name: Memantine  HCl 10 MG Oral Tablet] 180 tablet 0    Sig: Take 1 tablet by mouth twice daily     Neurology:  Alzheimer's Agents 2 Failed - 06/08/2024  2:52 PM      Failed - Cr in normal range and within 360 days    Creatinine  Date Value Ref Range Status  05/07/2013 1.21 0.60 - 1.30 mg/dL Final   Creatinine, Ser  Date Value Ref Range Status  05/18/2024 1.29 (H) 0.76 - 1.27 mg/dL Final         Passed - eGFR is 5 or above and within 360 days    EGFR (African American)  Date Value Ref Range Status  05/07/2013 >60  Final   GFR calc Af Amer  Date Value Ref Range Status  08/08/2019 54 (L) >60 mL/min Final   EGFR (Non-African Amer.)  Date Value Ref Range Status  05/07/2013 56 (L)  Final    Comment:    eGFR values <73mL/min/1.73 m2 may be an indication of chronic kidney disease (CKD). Calculated eGFR is useful in patients with stable renal function. The eGFR calculation will not be reliable in acutely ill patients when serum creatinine is changing rapidly. It is not useful in  patients on dialysis. The eGFR calculation may not be applicable to patients at the low and high extremes of body sizes, pregnant women, and vegetarians.    GFR, Estimated  Date Value Ref Range Status  05/10/2024 55 (L) >60 mL/min Final    Comment:    (NOTE) Calculated using the CKD-EPI Creatinine Equation (2021)    eGFR  Date Value Ref Range Status  05/18/2024 52 (L) >59 mL/min/1.73 Final         Passed - Valid encounter within last 6 months    Recent Outpatient Visits           3 weeks ago Hospital discharge follow-up    [facility] Melvin Pao, NP       Future Appointments             In 1 month McGowan, Clotilda DELENA RIGGERS [facility] [place]

## 2024-06-12 ENCOUNTER — Telehealth: Payer: Self-pay | Admitting: Nurse Practitioner

## 2024-06-12 NOTE — Telephone Encounter (Signed)
 Patient's daughter dropped off Request for Personal Care Services to be filled out by provider. Patient is requesting a call back at [phone] within 2-5 days when completed. Document is located in providers folder.

## 2024-06-13 NOTE — Telephone Encounter (Signed)
 Ok for E2C2 to review.   Please advise that there is no DPR indicating we can speak with Vickie whom dropped off the [facility] request forms. Due to no DPR we can not share any information at this time.   As for the forms since patient nor husband appear to have [place] Medicaid insurance the forms will not be useful for them.

## 2024-06-20 ENCOUNTER — Encounter

## 2024-06-22 NOTE — Progress Notes (Signed)
 Chief Complaint:   Chief Complaint  Patient presents with  . Annual Exam    Subjective:   Ian Parker is a 89 y.o. male in today for his annual wellness visit and physical exam.  Medicare Wellness Visit   Providers Rendering Care Dr. Norleen Kick Medicine  Functional Assessment (1) Hearing: Demonstrates  difficulty in hearing during normal conversation (2) Risk of Falls: Patient has had falls or near falls in the last year (3) Home Safety: Patient feels secure in their home. There are operational smoke alarms in multiple areas of the home. (4) Activities of Daily Living: Needs assistance to manages personal grooming and household chores, including cooking, cleaning and laundry.  Manages Personal finances without assistance.    Depression Screening Denies loss of interest in normal activities, has no episodes of weeping or anxiety and reports no changes in appetite or sleep.  PHQ 2/9 from today's flowsheet  PHQ-2 PHQ-2 Over the last 2 weeks, how often have you been bothered by any of the following problems? Little interest or pleasure in doing things: Not at all Feeling down, depressed, or hopeless: Not at all Patient Health Questionnaire-2 Score: 0  PHQ-9 (if PHQ >=3)    Depression Severity and Treatment Recommendations:  0-4= None  5-9= Mild / Treatment: Support, educate to call if worse; return in one month  10-14= Moderate / Treatment: Support, watchful waiting; Antidepressant or Psychotherapy  15-19= Moderately severe / Treatment: Antidepressant OR Psychotherapy  >= 20 = Major depression, severe / Antidepressant AND Psychotherapy   Alcohol Screening: Alcohol Use: Not At Risk (06/23/2024)   AUDIT-C   . Frequency of Alcohol Consumption: Never   . Average Number of Drinks: Patient does not drink   . Frequency of Binge Drinking: Never   Total time spent on alcohol screening was approximately 15 minutes.   Cognitive impairment Oriented to person, place  and time.  Responses appear appropriate and timely to this observer.   Prevention Plan  Item name                              Frequency        Month Due       Year Due Health Maintenance  Topic Date Due  . Adult Tetanus (Td And Tdap)  Never done  . Pneumococcal Vaccine: 50+ (1 of 1 - PCV) Never done  . Shingrix (1 of 2) Never done  . RSV Immunization Pregnant or 60+ (1 - 1-dose 75+ series) Never done  . COVID-19 Vaccine (4 - 2024-25 season) 07/04/2023  . Medicare Subsequent AWV [id number]  06/03/2024  . Influenza Vaccine (1) 07/03/2024  . Creatinine Level  06/12/2025  . Potassium Level  06/12/2025  . Lipid Panel  06/12/2025  . Serum Calcium  06/12/2025  . Depression Screening  06/23/2025  . Hib Vaccines  Aged Out  . Hepatitis A Vaccines  Aged Out  . Meningococcal B Vaccine  Aged Out  . Meningococcal ACWY Vaccine  Aged Out  . HPV Vaccines  Aged Out    Other personalized health advice Encouraged patient to exercise regularly with a target of 2.5 hours per week.  Encouraged attention to diet with good intake of fruits, vegetables, and limitation of red meat to 2 times a week or less    End of Life Counseling Patient has a living will in place.  Patient is a full code.       Current Outpatient Medications  Medication Sig Dispense Refill  . cholecalciferol  (VITAMIN D3) 1000 unit tablet Take by mouth    . cyanocobalamin  2000 MCG tablet Take 2,000 mcg by mouth once daily       . galantamine  (RAZADYNE  ER) 8 MG ER capsule TAKE 1 CAPSULE BY MOUTH ONCE DAILY WITH BREAKFAST 30 capsule 0  . memantine  (NAMENDA ) 10 MG tablet Take 1 tablet by mouth twice daily 180 tablet 0  . mirtazapine  (REMERON ) 7.5 MG tablet Take 7.5 mg by mouth at bedtime    . miscellaneous medical supply Misc Blood pressure monitor for high blood pressure 1 each 0  . nystatin  (MYCOSTATIN ) 100,000 unit/gram cream Apply topically 2 (two) times daily 30 g 1  . potassium chloride  (KLOR-CON ) 20 MEQ ER tablet Take 1 tablet  (20 mEq total) by mouth 2 (two) times daily 180 tablet 1  . triamterene -hydrochlorothiazide  (MAXZIDE ) 75-50 mg tablet Take 1 tablet by mouth once daily 90 tablet 0  . amLODIPine  (NORVASC ) 10 MG tablet Take 1 tablet (10 mg total) by mouth once daily 30 tablet 11  . risperiDONE (RISPERDAL) 0.5 MG tablet Take 1 tablet (0.5 mg total) by mouth at bedtime for 30 days 30 tablet 0   Current Facility-Administered Medications  Medication Dose Route Frequency Provider Last Rate Last Admin  . cyanocobalamin  (VITAMIN B12) injection 1,000 mcg  1,000 mcg Intramuscular Q30 Days Vannie Rush Barrett III   1,000 mcg at 06/11/15 9072    Allergies as of 06/23/2024 - Reviewed 06/23/2024  Allergen Reaction Noted  . Peanut Anaphylaxis   . Ace inhibitors Unknown   . Penicillins Other (See Comments) 04/30/2015  . Sulfa (sulfonamide antibiotics) Unknown     Past Medical History:  Diagnosis Date  . Benign esophageal stricture 03/14/2015  . Diverticulitis of colon (without mention of hemorrhage)(562.11)   . Esophageal reflux   . Gastric ulcer   . Hx of adenomatous colonic polyps 2004  . Internal hemorrhoids with other complication     Past Surgical History:  Procedure Laterality Date  . EGD  03/14/2015   Benign esophageal stricture/No Repeat/PYO  . PROSTATE CRYOABLATION    . THYROIDECTOMY TOTAL     Subtotal     Family History  Problem Relation Name Age of Onset  . Myocardial Infarction (Heart attack) Mother    . Brain cancer Father    . Myocardial Infarction (Heart attack) Sister    . Prostate cancer Brother      Social History:  reports that he has quit smoking. He has never used smokeless tobacco. He reports that he does not drink alcohol and does not use drugs.  Results for orders placed or performed in visit on 06/12/24  Comprehensive Metabolic Panel (CMP)  Result Value Ref Range   Glucose 109 70 - 110 mg/dL   Sodium 855 863 - 854 mmol/L   Potassium 4.0 3.6 - 5.1 mmol/L   Chloride 106 97  - 109 mmol/L   Carbon Dioxide (CO2) 30.8 22.0 - 32.0 mmol/L   Urea Nitrogen (BUN) 15 7 - 25 mg/dL   Creatinine 1.4 (H) 0.7 - 1.3 mg/dL   Glomerular Filtration Rate (eGFR) 47 (L) >60 mL/min/1.73sq m   Calcium 9.5 8.7 - 10.3 mg/dL   AST  23 8 - 39 U/L   ALT  22 6 - 57 U/L   Alk Phos (alkaline Phosphatase) 64 34 - 104 U/L   Albumin 4.4 3.5 - 4.8 g/dL   Bilirubin, Total 0.4 0.3 - 1.2 mg/dL   Protein, Total  6.7 6.1 - 7.9 g/dL   A/G Ratio 1.9 1.0 - 5.0 gm/dL  CBC w/auto Differential (5 Part)  Result Value Ref Range   WBC (White Blood Cell Count) 5.0 4.1 - 10.2 10^3/uL   RBC (Red Blood Cell Count) 5.77 4.69 - 6.13 10^6/uL   Hemoglobin 14.9 14.1 - 18.1 gm/dL   Hematocrit 54.3 59.9 - 52.0 %   MCV (Mean Corpuscular Volume) 79.0 (L) 80.0 - 100.0 fl   MCH (Mean Corpuscular Hemoglobin) 25.8 (L) 27.0 - 31.2 pg   MCHC (Mean Corpuscular Hemoglobin Concentration) 32.7 32.0 - 36.0 gm/dL   Platelet Count 835 849 - 450 10^3/uL   RDW-CV (Red Cell Distribution Width) 14.8 11.6 - 14.8 %   MPV (Mean Platelet Volume) 10.2 9.4 - 12.4 fl   Neutrophils 2.44 1.50 - 7.80 10^3/uL   Lymphocytes 1.81 1.00 - 3.60 10^3/uL   Monocytes 0.56 0.00 - 1.50 10^3/uL   Eosinophils 0.18 0.00 - 0.55 10^3/uL   Basophils 0.02 0.00 - 0.09 10^3/uL   Neutrophil % 48.5 32.0 - 70.0 %   Lymphocyte % 36.0 10.0 - 50.0 %   Monocyte % 11.1 4.0 - 13.0 %   Eosinophil % 3.6 1.0 - 5.0 %   Basophil% 0.4 0.0 - 2.0 %   Immature Granulocyte % 0.4 <=0.7 %   Immature Granulocyte Count 0.02 <=0.06 10^3/L  Lipid Panel w/calc LDL  Result Value Ref Range   Cholesterol, Total 173 100 - 200 mg/dL   Triglyceride 54 35 - 199 mg/dL   HDL (High Density Lipoprotein) Cholesterol 43.1 29.0 - 71.0 mg/dL   LDL Calculated 880 0 - 130 mg/dL   VLDL Cholesterol 11 mg/dL   Cholesterol/HDL Ratio 4.0   Urinalysis w/Microscopic  Result Value Ref Range   Color Light Yellow Colorless, Straw, Light Yellow, Yellow, Dark Yellow   Clarity Clear Clear   Specific  Gravity 1.013 1.005 - 1.030   pH, Urine 6.5 5.0 - 8.0   Protein, Urinalysis Negative Negative mg/dL   Glucose, Urinalysis Negative Negative mg/dL   Ketones, Urinalysis Negative Negative mg/dL   Blood, Urinalysis Negative Negative   Nitrite, Urinalysis Negative Negative   Leukocyte Esterase, Urinalysis Negative Negative   Bilirubin, Urinalysis Negative Negative   Urobilinogen, Urinalysis 0.2 0.2 - 1.0 mg/dL   WBC, UA 1 <=5 /hpf   Red Blood Cells, Urinalysis <1 <=3 /hpf   Bacteria, Urinalysis 0-5 0 - 5 /hpf   Squamous Epithelial Cells, Urinalysis 0 /hpf      ROS:  General: No fever, chills or recent illness. No change in weight Skin:   No skin lesions, growths, masses, rashes, pruritus  HEENT: No change in vision or hearing. No pain or difficulty with swallowing Respiratory: No cough or shortness of breath CV:  No chest pain or palpitations GI:  No pain, dyspepsia or change in bowel habits GU:  No dysuria, frequency, or hesitancy MSK:  No joint pain or injury Neurological: No headaches, changes in mental status, loss of sensation or strength Endocrine:  No heat or cold intolerance, polydipsia, polyuria  Objective:   Body mass index is 23.76 kg/m.  BP 120/80   Pulse 84   Ht 177.8 cm (5' 10)   Wt 75.1 kg (165 lb 9.6 oz)   SpO2 97%   BMI 23.76 kg/m   General: WD/WN male, in no acute distress HEENT: Pupils equal and round, EOMI. oral mucosa moist.  Oropharynx clear. Neck: supple, trachea midline; no thyromegaly Respiratory:clear to auscultation.  No dullness to  percussion.  No use of accessory muscles. Cardiac:  Regular rate and rhythm without murmur, gallops, or rubs Vascular: Carotid and radials 2+; distal pulses 2+ Abdominal:soft, nontender, positive bowel sounds.  No organomegaly. Musculoskeletal:  No clubbing, cyanosis or edema.  Full range of motion in upper and lower extremities bilaterally. Neuro: CN grossly intact.  No acute decrease in sensation in the upper and  lower extremities bilaterally. Integumental: Moist with no significant rashes or nodules Lymph: no cervical or supraclavicular lymphadenopathy   Assessment/Plan:   Medicare annual wellness visit, subsequent  (primary encounter diagnosis) Essential hypertension, benign Alzheimer's dementia without behavioral disturbance (CMS/HHS-HCC) Polyneuropathy associated with underlying disease (CMS/HHS-HCC) Benign prostatic hyperplasia with lower urinary tract symptoms, symptom details unspecified Anemia due to vitamin B12 deficiency, unspecified B12 deficiency type Depression screening (Z13.31)  Assessment and Plan  1.  Annual wellness visit.  See above documentation.  Reviewed lab results. 2.  Hypertension.  Well-controlled. 3.  Alzheimer's dementia.  Continue current medications.  He is having some behavioral issues so have added risperidone. 4.  BPH.  Asymptomatic. 5.  Vitamin B12 deficiency anemia.  Hemoglobin is normal.    Goals     . * Eat more fruits and vegetables (pt-stated)      Eat healthier, reduce salt intake, and take my medications properly.     . * Maintain health/healthy lifestyle (pt-stated)    . * Maintain health/healthy lifestyle (pt-stated)        NORLEEN ALM ROWER, MD  Portions of this note were created using dictation software and may contain typographical errors.  *Some images could not be shown.

## 2024-06-28 ENCOUNTER — Other Ambulatory Visit: Payer: Self-pay | Admitting: Nurse Practitioner

## 2024-06-29 NOTE — Telephone Encounter (Signed)
 Requested Prescriptions  Pending Prescriptions Disp Refills   triamterene -hydrochlorothiazide  (MAXZIDE ) 75-50 MG tablet [Pharmacy Med Name: Triamterene -HCTZ 75-50 MG Oral Tablet] 90 tablet 1    Sig: Take 1 tablet by mouth once daily     Cardiovascular: Diuretic Combos Failed - 06/29/2024  3:56 PM      Failed - Cr in normal range and within 180 days    Creatinine  Date Value Ref Range Status  05/07/2013 1.21 0.60 - 1.30 mg/dL Final   Creatinine, Ser  Date Value Ref Range Status  05/18/2024 1.29 (H) 0.76 - 1.27 mg/dL Final         Passed - K in normal range and within 180 days    Potassium  Date Value Ref Range Status  05/18/2024 4.2 3.5 - 5.2 mmol/L Final  05/07/2013 3.8 3.5 - 5.1 mmol/L Final         Passed - Na in normal range and within 180 days    Sodium  Date Value Ref Range Status  05/18/2024 141 134 - 144 mmol/L Final  05/07/2013 144 136 - 145 mmol/L Final         Passed - Last BP in normal range    BP Readings from Last 1 Encounters:  05/18/24 119/68         Passed - Valid encounter within last 6 months    Recent Outpatient Visits           1 month ago Hospital discharge follow-up   [place] [facility] Melvin Pao, NP       Future Appointments             In 4 weeks McGowan, Clotilda DELENA R[facility]ERS [facility] [place]

## 2024-07-03 ENCOUNTER — Emergency Department

## 2024-07-03 ENCOUNTER — Other Ambulatory Visit: Payer: Self-pay

## 2024-07-03 ENCOUNTER — Emergency Department: Admission: EM | Admit: 2024-07-03 | Discharge: 2024-07-03 | Disposition: A | Discharge status: Home / Self Care

## 2024-07-03 ENCOUNTER — Encounter: Payer: Self-pay | Admitting: Emergency Medicine

## 2024-07-03 DIAGNOSIS — I10 Essential (primary) hypertension: Secondary | ICD-10-CM | POA: Insufficient documentation

## 2024-07-03 DIAGNOSIS — R350 Frequency of micturition: Secondary | ICD-10-CM | POA: Diagnosis present

## 2024-07-03 DIAGNOSIS — R531 Weakness: Secondary | ICD-10-CM | POA: Insufficient documentation

## 2024-07-03 DIAGNOSIS — F0390 Unspecified dementia without behavioral disturbance: Secondary | ICD-10-CM | POA: Insufficient documentation

## 2024-07-03 DIAGNOSIS — W19XXXA Unspecified fall, initial encounter: Secondary | ICD-10-CM

## 2024-07-03 DIAGNOSIS — W010XXA Fall on same level from slipping, tripping and stumbling without subsequent striking against object, initial encounter: Secondary | ICD-10-CM | POA: Diagnosis not present

## 2024-07-03 DIAGNOSIS — Y92019 Unspecified place in single-family (private) house as the place of occurrence of the external cause: Secondary | ICD-10-CM | POA: Diagnosis not present

## 2024-07-03 DIAGNOSIS — N289 Disorder of kidney and ureter, unspecified: Secondary | ICD-10-CM | POA: Insufficient documentation

## 2024-07-03 LAB — CBC WITH DIFFERENTIAL/PLATELET
Abs Immature Granulocytes: 0.03 K/uL (ref 0.00–0.07)
Basophils Absolute: 0 K/uL (ref 0.0–0.1)
Basophils Relative: 0 %
Eosinophils Absolute: 0 K/uL (ref 0.0–0.5)
Eosinophils Relative: 0 %
HCT: 45 % (ref 39.0–52.0)
Hemoglobin: 14.7 g/dL (ref 13.0–17.0)
Immature Granulocytes: 0 %
Lymphocytes Relative: 7 %
Lymphs Abs: 0.5 K/uL — ABNORMAL LOW (ref 0.7–4.0)
MCH: 25.5 pg — ABNORMAL LOW (ref 26.0–34.0)
MCHC: 32.7 g/dL (ref 30.0–36.0)
MCV: 78 fL — ABNORMAL LOW (ref 80.0–100.0)
Monocytes Absolute: 0.9 K/uL (ref 0.1–1.0)
Monocytes Relative: 13 %
Neutro Abs: 5.4 K/uL (ref 1.7–7.7)
Neutrophils Relative %: 80 %
Platelets: 149 K/uL — ABNORMAL LOW (ref 150–400)
RBC: 5.77 MIL/uL (ref 4.22–5.81)
RDW: 15.1 % (ref 11.5–15.5)
WBC: 6.9 K/uL (ref 4.0–10.5)
nRBC: 0 % (ref 0.0–0.2)

## 2024-07-03 LAB — URINALYSIS, ROUTINE W REFLEX MICROSCOPIC
Bacteria, UA: NONE SEEN
Bilirubin Urine: NEGATIVE
Glucose, UA: NEGATIVE mg/dL
Ketones, ur: NEGATIVE mg/dL
Leukocytes,Ua: NEGATIVE
Nitrite: NEGATIVE
Protein, ur: 100 mg/dL — AB
Specific Gravity, Urine: 1.013 (ref 1.005–1.030)
pH: 5 (ref 5.0–8.0)

## 2024-07-03 LAB — COMPREHENSIVE METABOLIC PANEL WITH GFR
ALT: 23 U/L (ref 0–44)
AST: 31 U/L (ref 15–41)
Albumin: 4.4 g/dL (ref 3.5–5.0)
Alkaline Phosphatase: 52 U/L (ref 38–126)
Anion gap: 13 (ref 5–15)
BUN: 17 mg/dL (ref 8–23)
CO2: 22 mmol/L (ref 22–32)
Calcium: 8.8 mg/dL — ABNORMAL LOW (ref 8.9–10.3)
Chloride: 100 mmol/L (ref 98–111)
Creatinine, Ser: 1.26 mg/dL — ABNORMAL HIGH (ref 0.61–1.24)
GFR, Estimated: 54 mL/min — ABNORMAL LOW (ref >60)
Glucose, Bld: 129 mg/dL — ABNORMAL HIGH (ref 70–99)
Potassium: 3.3 mmol/L — ABNORMAL LOW (ref 3.5–5.1)
Sodium: 135 mmol/L (ref 135–145)
Total Bilirubin: 0.8 mg/dL (ref 0.0–1.2)
Total Protein: 7.9 g/dL (ref 6.5–8.1)

## 2024-07-03 LAB — LACTIC ACID, PLASMA
Lactic Acid, Venous: 2.8 mmol/L (ref 0.5–1.9)
Lactic Acid, Venous: 1.8 mmol/L (ref 0.5–1.9)

## 2024-07-03 LAB — LIPASE, BLOOD: Lipase: 38 U/L (ref 11–51)

## 2024-07-03 LAB — PROCALCITONIN: Procalcitonin: 0.11 ng/mL

## 2024-07-03 MED ORDER — SODIUM CHLORIDE 0.9 % IV BOLUS
500.0000 mL | Freq: Once | INTRAVENOUS | Status: AC
  Administered 2024-07-03: 500 mL via INTRAVENOUS

## 2024-07-03 MED ORDER — AMLODIPINE BESYLATE 5 MG PO TABS
10.0000 mg | ORAL_TABLET | Freq: Once | ORAL | Status: AC
  Administered 2024-07-03: 10 mg via ORAL
  Filled 2024-07-03: qty 2

## 2024-07-03 MED ORDER — POTASSIUM CHLORIDE CRYS ER 20 MEQ PO TBCR
40.0000 meq | EXTENDED_RELEASE_TABLET | Freq: Once | ORAL | Status: AC
  Administered 2024-07-03: 40 meq via ORAL
  Filled 2024-07-03: qty 2

## 2024-07-03 NOTE — ED Provider Notes (Signed)
 [facility] Provider Note    Event Date/Time   First MD Initiated Contact with Patient 07/03/24 1044     (approximate)   History   Weakness   HPI  Ian Parker is a 89 y.o. male with a past medical history of dementia, hypertension and BPH who presents from home after a trip and fall and concern for increasing urinary frequency and worsening generalized weakness.  History is initially obtained by EMS as patient is incapable giving such.  Per EMS patient is at his baseline mental status and not on any anticoagulation.  Patient tripped over a rug today and fell forward.  No loss of consciousness.  He has endorsed some increased urinary frequency and burning with urination per family.  Son arrives at bedside hour after arrival.  Patient's son states that daughter had noticed some hematuria on 1 occasion in the patient's urine.  Patient's son confirms patient's baseline mental status.  Patient denies any headache chest pain shortness of breath abdominal pain.  Last time he was admitted for EGD as he did swallow a coin      Physical Exam   Triage Vital Signs: ED Triage Vitals  Encounter Vitals Group     BP      Girls Systolic BP Percentile      Girls Diastolic BP Percentile      Boys Systolic BP Percentile      Boys Diastolic BP Percentile      Pulse      Resp      Temp      Temp src      SpO2      Weight      Height      Head Circumference      Peak Flow      Pain Score      Pain Loc      Pain Education      Exclude from Growth Chart     Most recent vital signs: Vitals:   07/03/24 1246 07/03/24 1300  BP: (!) 158/74 (!) 149/95  Pulse:  92  Resp:  (!) 23  Temp:    SpO2:  92%    Nursing Triage Note reviewed. Vital signs reviewed and patients oxygen saturation is normoxic  General: Patient is well nourished, well developed, awake and alert, resting comfortably in no acute distress Head: Normocephalic and atraumatic No scalp lacerations or  abrasions Eyes: Normal inspection, extraocular muscles intact, no conjunctival pallor Ear, nose, throat: Normal external exam Neck: Normal range of motion Respiratory: Patient is in no respiratory distress, lungs CTAB Cardiovascular: Patient is not tachycardic, RRR without murmur appreciated GI: Abd SNT with no guarding or rebound  Back: Normal inspection of the back with good strength and range of motion throughout all ext Extremities: pulses intact with good cap refills, no LE pitting edema or calf tenderness Neuro: The patient is alert and oriented to person, not to place or time, very confused, with 5/5 bilat UE/LE strength, no gross motor or sensory defects noted. Coordination appears to be adequate. Skin: Warm, dry, and intact Psych: normal mood and affect, no SI or HI  ED Results / Procedures / Treatments   Labs (all labs ordered are listed, but only abnormal results are displayed) Labs Reviewed  CBC WITH DIFFERENTIAL/PLATELET - Abnormal; Notable for the following components:      Result Value   MCV 78.0 (*)    MCH 25.5 (*)    Platelets 149 (*)  Lymphs Abs 0.5 (*)    All other components within normal limits  COMPREHENSIVE METABOLIC PANEL WITH GFR - Abnormal; Notable for the following components:   Potassium 3.3 (*)    Glucose, Bld 129 (*)    Creatinine, Ser 1.26 (*)    Calcium 8.8 (*)    GFR, Estimated 54 (*)    All other components within normal limits  LACTIC ACID, PLASMA - Abnormal; Notable for the following components:   Lactic Acid, Venous 2.8 (*)    All other components within normal limits  URINALYSIS, ROUTINE W REFLEX MICROSCOPIC - Abnormal; Notable for the following components:   Color, Urine YELLOW (*)    APPearance CLEAR (*)    Hgb urine dipstick SMALL (*)    Protein, ur 100 (*)    All other components within normal limits  LIPASE, BLOOD  PROCALCITONIN  LACTIC ACID, PLASMA     EKG EKG and rhythm strip are interpreted by myself:   EKG: [Normal  sinus rhythm] at heart rate of 85, normal QRS duration, QTc 430, nonspecific ST segments and T waves no ectopy EKG not consistent with Acute STEMI Rhythm strip: NSR in lead II   RADIOLOGY CT head: No intracranial hemorrhage, independent review interpretation radiologist agrees CT cervical spine: No acute fracture and patient's son told about degenerative changes Chest x-ray: No acute abnormality my independent review interpretation    PROCEDURES:  Critical Care performed: No  Procedures   MEDICATIONS ORDERED IN ED: Medications  sodium chloride  0.9 % bolus 500 mL (0 mLs Intravenous Stopped 07/03/24 1158)  potassium chloride  SA (KLOR-CON  M) CR tablet 40 mEq (40 mEq Oral Given 07/03/24 1158)  sodium chloride  0.9 % bolus 500 mL (0 mLs Intravenous Stopped 07/03/24 1246)  amLODipine  (NORVASC ) tablet 10 mg (10 mg Oral Given 07/03/24 1246)     IMPRESSION / MDM / ASSESSMENT AND PLAN / ED COURSE                                Differential diagnosis includes, but is not limited to, intracranial hemorrhage, cervical spine fracture, UTI, anemia, electrolyte derangement, occult infection such as pneumonia   ED course: Patient presents and is without any new focal neurological deficits.  Urinalysis not consistent with UTI.  Patient without anemia or leukocytosis.  No profound electrolyte derangements.  Imaging is unremarkable.  I counseled the patient's family that I did not have a cause for the patient's increased urination but I felt that he was safe to continue the workup with his primary care physician.  All voiced understanding and requested discharge   Clinical Course as of 07/03/24 1553  Mon Jul 03, 2024  1124 WBC: 6.9 No leukocytosis [HD]  1133 Urinalysis, Routine w reflex microscopic -Urine, Catheterized(!) Does not seem consistent with UTI [HD]  1133 Potassium(!): 3.3 Potassium borderline low will replete [HD]  1133 Creatinine(!): 1.26 Creatinine slightly elevated than baseline  [HD]  1204 DG Chest 2 View [HD]  1227 CT Cervical Spine Wo Contrast No acute abnormality [HD]  1227 CT Head Wo Contrast No acute abnormality [HD]  1227 DG Chest 2 View No acute abnormality [HD]  1243 Notified that patient has not taken his amlodipine  today will administer [HD]  1313 Lactic Acid, Venous: 1.8 Repeat lactic much improved.  Will work on discharge [HD]    Clinical Course User Index [HD] Nicholaus Rolland BRAVO, MD   At time of discharge there is no  evidence of acute life, limb, vision, or fertility threat. Patient has stable vital signs, pain is well controlled, patient is ambulatory and p.o. tolerant.  Discharge instructions were completed using the [employer] system. I would refer you to those at this time. All warnings prescriptions follow-up etc. were discussed in detail with the patient. Patient indicates understanding and is agreeable with this plan. All questions answered.  Patient is made aware that they may return to the emergency department for any worsening or new condition or for any other emergency. -- Risk: 5 This patient has a high risk of morbidity due to further diagnostic testing or treatment. Rationale: This patient's evaluation and management involve a high risk of morbidity due to the potential severity of presenting symptoms, need for diagnostic testing, and/or initiation of treatment that may require close monitoring. The differential includes conditions with potential for significant deterioration or requiring escalation of care. Treatment decisions in the ED, including medication administration, procedural interventions, or disposition planning, reflect this level of risk. Additional Support: -- Drug therapy requiring intensive monitoring for toxicity [ ]  -- Decision regarding elective major surgery with idenitified patient or procedure risk factors [ ]  -- Decision regarding hospitalization or escalation of hospital-level care [ ]  -- Decision not to resuscitate or  to de-escalate care because of poor prognosis [ ]  -- Parental controlled substances [ ]   COPA: 5 The patient has a severe exacerbation, progression, or side effect of treatment of the following illness/illnesses: []  OR  The patient has the following acute or chronic illness/injury that poses a possible threat to life or bodily function: [X] : The patient has a potentially serious acute condition or an acute exacerbation of a chronic illness requiring urgent evaluation and management in the Emergency Department. The clinical presentation necessitates immediate consideration of life-threatening or function-threatening diagnoses, even if they are ultimately ruled out.  Data(2/3 categories following were performed): 5 I reviewed or ordered at least three unique tests, external notes, and/or the history required an independent historian as one of the three requirements as following: CBC, CMP, urinalysis, son AND  I independently interpreted the following test: CT head OR  I discussed the management of the patient with the following external physician or qualified healthcare provider: []    Suggested E/M Coding Level: 5, 99285, This has been selected based on the Aug 08, 2022 CPT guidelines for E/M codes in the Emergency Department based on 2/3 of the CoPA, Data, and Risk.    FINAL CLINICAL IMPRESSION(S) / ED DIAGNOSES   Final diagnoses:  Fall from standing, initial encounter  General weakness  Acute renal insufficiency     Rx / DC Orders   ED Discharge Orders     None        Note:  This document was prepared using Dragon voice recognition software and may include unintentional dictation errors.   Nicholaus Rolland BRAVO, MD 07/03/24 [phone]

## 2024-07-03 NOTE — ED Triage Notes (Signed)
 Patient to ED via [facility] from home for generalized weakness. Pt had a mechanical fall today- denies injuries, LOC or blood thinners. Pt reports some burning with urination hx of UTI and dementia.   EMS VS:  98.2 96 HR 143/80 138 cbg

## 2024-07-03 NOTE — ED Notes (Signed)
 Nicholaus, MD made aware of Lactic Acid of 2.8

## 2024-07-03 NOTE — ED Notes (Signed)
 Pt's panted noted to be wet and brief full. Pt cleaned and placed in new brief/hospital gown at this time. Urine sample obtained.

## 2024-07-03 NOTE — Discharge Instructions (Signed)
 You were seen in the emergency department after a fall from standing.  Workup today was reassuring for any acute life-threatening problem.  It did appear that you were mildly dehydrated and you were given IV fluids.  No signs of infection were found.  Please continue your regular home medications.  Please call your primary care physician tomorrow and arrange semiurgent follow-up.  Return with any acutely worsening symptoms or any other emergency.  It was very nice meeting you and I wish you the best of luck -- At time of discharge there is no evidence of acute life, limb, vision, or fertility threat. Patient has stable vital signs, pain is well controlled, patient is ambulatory and p.o. tolerant.  Discharge instructions were completed using the [employer] system. I would refer you to those at this time. All warnings prescriptions follow-up etc. were discussed in detail with the patient. Patient indicates understanding and is agreeable with this plan. All questions answered.  Patient is made aware that they may return to the emergency department for any worsening or new condition or for any other emergency.

## 2024-07-10 ENCOUNTER — Encounter: Payer: Self-pay | Admitting: Nurse Practitioner

## 2024-07-10 ENCOUNTER — Inpatient Hospital Stay: Admitting: Nurse Practitioner

## 2024-07-10 ENCOUNTER — Ambulatory Visit (INDEPENDENT_AMBULATORY_CARE_PROVIDER_SITE_OTHER): Admitting: Nurse Practitioner

## 2024-07-10 VITALS — BP 137/68 | HR 82 | Temp 97.3°F | Ht 69.0 in | Wt 155.8 lb

## 2024-07-10 DIAGNOSIS — F02818 Dementia in other diseases classified elsewhere, unspecified severity, with other behavioral disturbance: Secondary | ICD-10-CM

## 2024-07-10 DIAGNOSIS — R319 Hematuria, unspecified: Secondary | ICD-10-CM

## 2024-07-10 DIAGNOSIS — G309 Alzheimer's disease, unspecified: Secondary | ICD-10-CM | POA: Diagnosis not present

## 2024-07-10 NOTE — Assessment & Plan Note (Addendum)
 Chronic. Family has noticed an improvement in symptoms with Risperidone.  Will continue with medication at this time.  Reviewed disease progression with daughter during visit.  Will also order home health due to recent falls.  Hoping for PT and OT in home to help prevent falls.

## 2024-07-10 NOTE — Progress Notes (Signed)
 BP 137/68   Pulse 82   Temp (!) 97.3 F (36.3 C) (Oral)   Ht 5' 9 (1.753 m)   Wt 155 lb 12.8 oz (70.7 kg)   SpO2 98%   BMI 23.01 kg/m    Subjective:    Patient ID: Ian Parker, male    DOB: 09/06/1931, 89 y.o.   MRN: [id number]  HPI: Ian Parker is a 89 y.o. male  Chief Complaint  Patient presents with   ER Follow Up   Patient presents to clinic after clinic due to a fall that sent him to the ER.  The workup in the ER was unremarkable.  There was some hematuria.  Does have follow up coming later this month   Patient is here with one of his daughter's this morning.  He was started on Risperidone by another PCP office due to sudden outbursts of anger.  Patient's daughter reports that it is helping with these.  Patient is no longer experiencing these episodes.  Patient's daughter states they are trying to find resources to help her parents stay in their home.  Patient is starting to sleep more.  His wife also has dementia.  They aren't eating much and someone has to be in the home to make sure they are eating and taking their medications.      Relevant past medical, surgical, family and social history reviewed and updated as indicated. Interim medical history since our last visit reviewed. Allergies and medications reviewed and updated.  Review of Systems  Genitourinary:  Positive for hematuria.  Neurological:        Falls  Psychiatric/Behavioral:  Negative for agitation and behavioral problems.     Per HPI unless specifically indicated above     Objective:    BP 137/68   Pulse 82   Temp (!) 97.3 F (36.3 C) (Oral)   Ht 5' 9 (1.753 m)   Wt 155 lb 12.8 oz (70.7 kg)   SpO2 98%   BMI 23.01 kg/m   Wt Readings from Last 3 Encounters:  07/10/24 155 lb 12.8 oz (70.7 kg)  07/03/24 172 lb 2.9 oz (78.1 kg)  05/18/24 166 lb 12.8 oz (75.7 kg)    Physical Exam Vitals and nursing note reviewed.  Constitutional:      General: He is not in acute distress.     Appearance: Normal appearance. He is not ill-appearing, toxic-appearing or diaphoretic.  HENT:     Head: Normocephalic.     Right Ear: External ear normal.     Left Ear: External ear normal.     Nose: Nose normal. No congestion or rhinorrhea.     Mouth/Throat:     Mouth: Mucous membranes are moist.  Eyes:     General:        Right eye: No discharge.        Left eye: No discharge.     Extraocular Movements: Extraocular movements intact.     Conjunctiva/sclera: Conjunctivae normal.     Pupils: Pupils are equal, round, and reactive to light.  Cardiovascular:     Rate and Rhythm: Normal rate and regular rhythm.     Heart sounds: No murmur heard. Pulmonary:     Effort: Pulmonary effort is normal. No respiratory distress.     Breath sounds: Normal breath sounds. No wheezing, rhonchi or rales.  Abdominal:     General: Abdomen is flat. Bowel sounds are normal.  Musculoskeletal:     Cervical back: Normal range of  motion and neck supple.  Skin:    General: Skin is warm and dry.     Capillary Refill: Capillary refill takes less than 2 seconds.  Neurological:     General: No focal deficit present.     Mental Status: He is alert and oriented to person, place, and time.  Psychiatric:        Mood and Affect: Mood normal.        Behavior: Behavior normal.     Results for orders placed or performed during the hospital encounter of 07/03/24  CBC with Differential   Collection Time: 07/03/24 10:58 AM  Result Value Ref Range   WBC 6.9 4.0 - 10.5 K/uL   RBC 5.77 4.22 - 5.81 MIL/uL   Hemoglobin 14.7 13.0 - 17.0 g/dL   HCT 54.9 60.9 - 47.9 %   MCV 78.0 (L) 80.0 - 100.0 fL   MCH 25.5 (L) 26.0 - 34.0 pg   MCHC 32.7 30.0 - 36.0 g/dL   RDW 84.8 88.4 - 84.4 %   Platelets 149 (L) 150 - 400 K/uL   nRBC 0.0 0.0 - 0.2 %   Neutrophils Relative % 80 %   Neutro Abs 5.4 1.7 - 7.7 K/uL   Lymphocytes Relative 7 %   Lymphs Abs 0.5 (L) 0.7 - 4.0 K/uL   Monocytes Relative 13 %   Monocytes Absolute 0.9  0.1 - 1.0 K/uL   Eosinophils Relative 0 %   Eosinophils Absolute 0.0 0.0 - 0.5 K/uL   Basophils Relative 0 %   Basophils Absolute 0.0 0.0 - 0.1 K/uL   Immature Granulocytes 0 %   Abs Immature Granulocytes 0.03 0.00 - 0.07 K/uL  Comprehensive metabolic panel   Collection Time: 07/03/24 10:58 AM  Result Value Ref Range   Sodium 135 135 - 145 mmol/L   Potassium 3.3 (L) 3.5 - 5.1 mmol/L   Chloride 100 98 - 111 mmol/L   CO2 22 22 - 32 mmol/L   Glucose, Bld 129 (H) 70 - 99 mg/dL   BUN 17 8 - 23 mg/dL   Creatinine, Ser 8.73 (H) 0.61 - 1.24 mg/dL   Calcium 8.8 (L) 8.9 - 10.3 mg/dL   Total Protein 7.9 6.5 - 8.1 g/dL   Albumin 4.4 3.5 - 5.0 g/dL   AST 31 15 - 41 U/L   ALT 23 0 - 44 U/L   Alkaline Phosphatase 52 38 - 126 U/L   Total Bilirubin 0.8 0.0 - 1.2 mg/dL   GFR, Estimated 54 (L) >60 mL/min   Anion gap 13 5 - 15  Lipase, blood   Collection Time: 07/03/24 10:58 AM  Result Value Ref Range   Lipase 38 11 - 51 U/L  Procalcitonin   Collection Time: 07/03/24 10:58 AM  Result Value Ref Range   Procalcitonin 0.11 ng/mL  Lactic acid, plasma   Collection Time: 07/03/24 10:58 AM  Result Value Ref Range   Lactic Acid, Venous 2.8 (HH) 0.5 - 1.9 mmol/L  Urinalysis, Routine w reflex microscopic -Urine, Catheterized   Collection Time: 07/03/24 10:58 AM  Result Value Ref Range   Color, Urine YELLOW (A) YELLOW   APPearance CLEAR (A) CLEAR   Specific Gravity, Urine 1.013 1.005 - 1.030   pH 5.0 5.0 - 8.0   Glucose, UA NEGATIVE NEGATIVE mg/dL   Hgb urine dipstick SMALL (A) NEGATIVE   Bilirubin Urine NEGATIVE NEGATIVE   Ketones, ur NEGATIVE NEGATIVE mg/dL   Protein, ur 899 (A) NEGATIVE mg/dL   Nitrite NEGATIVE NEGATIVE  Leukocytes,Ua NEGATIVE NEGATIVE   RBC / HPF 0-5 0 - 5 RBC/hpf   WBC, UA 0-5 0 - 5 WBC/hpf   Bacteria, UA NONE SEEN NONE SEEN   Squamous Epithelial / HPF 0-5 0 - 5 /HPF   Mucus PRESENT   Lactic acid, plasma   Collection Time: 07/03/24 12:50 PM  Result Value Ref Range    Lactic Acid, Venous 1.8 0.5 - 1.9 mmol/L      Assessment & Plan:   Problem List Items Addressed This Visit       Nervous and Auditory   Alzheimer's dementia with behavioral disturbance (HCC)   Chronic. Family has noticed an improvement in symptoms with Risperidone.  Will continue with medication at this time.  Reviewed disease progression with daughter during visit.  Will also order home health due to recent falls.  Hoping for PT and OT in home to help prevent falls.      Relevant Medications   risperiDONE (RISPERDAL) 0.5 MG tablet   Other Relevant Orders   AMB Referral VBCI Care Management   Ambulatory referral to Home Health   Other Visit Diagnoses       Hematuria, unspecified type    -  Primary   Keep follow up with Urology for 9/25.   Relevant Orders   Comprehensive metabolic panel with GFR   Ambulatory referral to Urology        Follow up plan: Return in about 3 months (around 10/09/2024) for HTN, HLD, DM2 FU.

## 2024-07-11 ENCOUNTER — Telehealth: Payer: Self-pay

## 2024-07-11 ENCOUNTER — Ambulatory Visit: Payer: Self-pay | Admitting: Nurse Practitioner

## 2024-07-11 LAB — COMPREHENSIVE METABOLIC PANEL WITH GFR
ALT: 28 IU/L (ref 0–44)
AST: 32 IU/L (ref 0–40)
Albumin: 4.5 g/dL (ref 3.6–4.6)
Alkaline Phosphatase: 72 IU/L (ref 44–121)
BUN/Creatinine Ratio: 12 (ref 10–24)
BUN: 15 mg/dL (ref 10–36)
Bilirubin Total: 0.6 mg/dL (ref 0.0–1.2)
CO2: 21 mmol/L (ref 20–29)
Calcium: 9.7 mg/dL (ref 8.6–10.2)
Chloride: 104 mmol/L (ref 96–106)
Creatinine, Ser: 1.25 mg/dL (ref 0.76–1.27)
Globulin, Total: 2.7 g/dL (ref 1.5–4.5)
Glucose: 145 mg/dL — ABNORMAL HIGH (ref 70–99)
Potassium: 4.4 mmol/L (ref 3.5–5.2)
Sodium: 143 mmol/L (ref 134–144)
Total Protein: 7.2 g/dL (ref 6.0–8.5)
eGFR: 54 mL/min/1.73 — ABNORMAL LOW (ref >59)

## 2024-07-11 NOTE — Progress Notes (Signed)
 Complex Care Management Note  Care Guide Note 07/11/2024 Name: Ian Parker MRN: [id number] DOB: 12/05/1930  Ian Parker is a 89 y.o. year old male who sees Melvin Pao, NP for primary care. I reached out to [name] by phone today to offer complex care management services.  [name] was given information about Complex Care Management services today including:   The Complex Care Management services include support from the care team which includes your Nurse Care Manager, Clinical Social Worker, or Pharmacist.  The Complex Care Management team is here to help remove barriers to the health concerns and goals most important to you. Complex Care Management services are voluntary, and the patient may decline or stop services at any time by request to their care team member.   Complex Care Management Consent Status: Patient agreed to services and verbal consent obtained.   Follow up plan:  Telephone appointment with complex care management team member scheduled for:  [facility] 07/21/2024 LCSW 07/27/2024  Encounter Outcome:  Patient Scheduled  Jeoffrey Buffalo , RMA     [name]  [facility], [facility] Guide  Direct Dial: [phone]  Website: [email or web address]

## 2024-07-12 ENCOUNTER — Other Ambulatory Visit: Payer: Self-pay | Admitting: Nurse Practitioner

## 2024-07-13 ENCOUNTER — Ambulatory Visit: Admitting: Podiatry

## 2024-07-13 ENCOUNTER — Ambulatory Visit: Payer: Self-pay | Admitting: Nurse Practitioner

## 2024-07-13 NOTE — Telephone Encounter (Signed)
 Called and notified Ian Parker of [place] message. Ian Parker states she will relay the information to [place].

## 2024-07-13 NOTE — Telephone Encounter (Signed)
Routing to provider to advise on medication.

## 2024-07-13 NOTE — Telephone Encounter (Signed)
 Copied from CRM #[id number]. Topic: Clinical - Medication Question >> Jul 13, 2024  2:03 PM Avram MATSU wrote: Reason for CRM: Reena needs a medication clarification for flowmax, she needs to know if the pt still takes it. Also a SW was suppose to come out to see the patient and would like to know what's happening with that. Please advise: Ask for [facility] [phone]

## 2024-07-13 NOTE — Telephone Encounter (Addendum)
 Please let Reena know that the prescription for flomax  is no longer on his list.  He does not get it from the office, he gets it from Urology.  He has an appt coming up with them on 9/25.   I don't know anything about a Child psychotherapist coming out to see him.  He does have a phone call set up with the social worker on 9/25.

## 2024-07-13 NOTE — Telephone Encounter (Signed)
 Requested Prescriptions  Pending Prescriptions Disp Refills   galantamine  (RAZADYNE  ER) 8 MG 24 hr capsule [Pharmacy Med Name: Galantamine  Hydrobromide ER 8 MG Oral Capsule Extended Release 24 Hour] 90 capsule 1    Sig: TAKE 1 CAPSULE BY MOUTH ONCE DAILY WITH BREAKFAST     Neurology:  Alzheimer's Agents - galantamine  Passed - 07/13/2024 12:07 PM      Passed - Cr in normal range and within 360 days    Creatinine  Date Value Ref Range Status  05/07/2013 1.21 0.60 - 1.30 mg/dL Final   Creatinine, Ser  Date Value Ref Range Status  07/10/2024 1.25 0.76 - 1.27 mg/dL Final         Passed - ALT in normal range and within 360 days    ALT  Date Value Ref Range Status  07/10/2024 28 0 - 44 IU/L Final   SGPT (ALT)  Date Value Ref Range Status  05/05/2013 39 12 - 78 U/L Final         Passed - AST in normal range and within 360 days    AST  Date Value Ref Range Status  07/10/2024 32 0 - 40 IU/L Final   SGOT(AST)  Date Value Ref Range Status  05/05/2013 23 15 - 37 Unit/L Final         Passed - eGFR is 9 or above and within 360 days    EGFR (African American)  Date Value Ref Range Status  05/07/2013 >60  Final   GFR calc Af Amer  Date Value Ref Range Status  08/08/2019 54 (L) >60 mL/min Final   EGFR (Non-African Amer.)  Date Value Ref Range Status  05/07/2013 56 (L)  Final    Comment:    eGFR values <59mL/min/1.73 m2 may be an indication of chronic kidney disease (CKD). Calculated eGFR is useful in patients with stable renal function. The eGFR calculation will not be reliable in acutely ill patients when serum creatinine is changing rapidly. It is not useful in  patients on dialysis. The eGFR calculation may not be applicable to patients at the low and high extremes of body sizes, pregnant women, and vegetarians.    GFR, Estimated  Date Value Ref Range Status  07/03/2024 54 (L) >60 mL/min Final    Comment:    (NOTE) Calculated using the CKD-EPI Creatinine Equation  (2021)    eGFR  Date Value Ref Range Status  07/10/2024 54 (L) >59 mL/min/1.73 Final         Passed - Valid encounter within last 6 months    Recent Outpatient Visits           3 days ago Hematuria, unspecified type   [facility] Ian Pao, NP   1 month ago Hospital discharge follow-up   [facility] Ian Pao, NP       Future Appointments             In 2 weeks McG[place], Ian Parker [facility] [place]

## 2024-07-21 ENCOUNTER — Other Ambulatory Visit: Payer: Self-pay
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
## 2024-07-21 NOTE — Patient Instructions (Signed)
 Visit Information  Thank you for taking time to visit with me today. Please don't hesitate to contact me if I can be of assistance to you before our next scheduled appointment.  Our next appointment is by telephone on Monday, October 6th at 9:00am Please call the care guide team at [phone] if you need to cancel or reschedule your appointment.   Following is a copy of your care plan:   Goals Addressed             This Visit's Progress    VBCI RN Care Plan       Problems:  Chronic Disease Management support and education needs related to HTN  Goal: Over the next 30 days the Patient and caregiver will demonstrate Ongoing adherence to prescribed treatment plan for HTN as evidenced by reporting blood pressures that are 100/60 and below Over the next 15 days, caregiver will demonstrate improved adherence to prescribed treatment plan for HTN AEB obtaining BP monitor, taking BP once daily and log.   Interventions:   Hypertension Interventions: Last practice recorded BP readings:  BP Readings from Last 3 Encounters:  07/10/24 137/68  07/03/24 (!) 149/95  05/18/24 119/68   Most recent eGFR/CrCl:  Lab Results  Component Value Date   EGFR 54 (L) 07/10/2024    No components found for: CRCL  Reviewed medications with patient and discussed importance of compliance Advised patient, providing education and rationale, to monitor blood pressure daily and record, calling PCP for findings outside established parameters Reviewed scheduled/upcoming provider appointments including:  Advised patient to discuss trending low blood pressures with provider Assessed social determinant of health barriers Discussed recent BP taken by HHPT, value of 100/58, instructed family to take BP once daily at least two hours after taking BP meds, log results, call PCP for BP's trending at 100/60 and below.    Patient Self-Care Activities:  Call pharmacy for medication refills 3-7 days in advance of running  out of medications Call provider office for new concerns or questions  Take medications as prescribed   Obtain BP monitor, best with cuff that fits upper arm.   Plan:  Telephone follow up appointment with care management team member scheduled for:  08/07/24 at 9:00am.          VBCI RN Care Plan       Problems:  Chronic Disease Management support and education needs related to Alzheimer's dementia  Goal: Over the next 15 days the Patient and caregivers will demonstrate ongoing self health care management ability for managing skin integrity   as evidenced by     no skin breakdown.  as evidenced by no skin breakdown.  Over the next 3 months patient will experience decrease in ED visits related to falls AEB electronic medical record review, ED visits in last 6 months - 2   Interventions:   Evaluation of current treatment plan related to Cognitive Deficits self-management and patient's adherence to plan as established by provider. Discussed plans with patient for ongoing care management follow up and provided patient with direct contact information for care management team Reviewed medications with patient and discussed risperidone Reviewed scheduled/upcoming provider appointments including PCP 07/24/24, Urology 07/27/24 Advised patient to discuss risperidone dosing that is causing daytime sleepiness with provider Advised caregiver to consider using medication box with alarm for medication reminders.  Advised caregiver to perform daily skin checks to monitor due to nighttime urine incontinence. Advised caregiver to speak to [facility] who is visiting home today for recommendations for  home safety   Patient Self-Care Activities:  Attend all scheduled provider appointments Call provider office for new concerns or questions  Work with HHPT/OT for patient safety recommendations  Plan:  Telephone follow up appointment with care management team member scheduled for:  08/07/24            A  reminder to ALL patients/family/friends, please call the [place]  National Suicide Prevention Lifeline: 956-065-5059 or TTY: (213) 410-3874 TTY 410-825-2497) to talk to a trained counselor  Patient verbalizes understanding of instructions and care plan provided today and agrees to view in MyChart. Active MyChart status and patient understanding of how to access instructions and care plan via MyChart confirmed with patient.     [name] Stamp BSN, CCM [place]  VBCI Population Health RN Care Manager Direct Dial: [phone]  Fax: [phone]

## 2024-07-24 ENCOUNTER — Encounter: Payer: Self-pay | Admitting: Nurse Practitioner

## 2024-07-24 ENCOUNTER — Ambulatory Visit (INDEPENDENT_AMBULATORY_CARE_PROVIDER_SITE_OTHER): Admitting: Nurse Practitioner

## 2024-07-24 VITALS — BP 118/64 | HR 99 | Temp 98.1°F | Ht 69.0 in | Wt 155.6 lb

## 2024-07-24 DIAGNOSIS — Z659 Problem related to unspecified psychosocial circumstances: Secondary | ICD-10-CM | POA: Diagnosis not present

## 2024-07-24 DIAGNOSIS — U071 COVID-19: Secondary | ICD-10-CM

## 2024-07-24 DIAGNOSIS — Z91148 Patient's other noncompliance with medication regimen for other reason: Secondary | ICD-10-CM | POA: Diagnosis not present

## 2024-07-24 NOTE — Progress Notes (Signed)
 BP 118/64   Pulse 99   Temp 98.1 F (36.7 C) (Oral)   Ht 5' 9 (1.753 m)   Wt 155 lb 9.6 oz (70.6 kg)   SpO2 98%   BMI 22.98 kg/m    Subjective:    Patient ID: Ian Parker, male    DOB: June 09, 1931, 89 y.o.   MRN: [id number]  HPI: Ian Parker is a 89 y.o. male  Chief Complaint  Patient presents with   ER Follow Up   Patient was seen in the ER on 07/12/24.  He was having fatigue and weakness at the time which is why the family took him to the ER.  He denies any weakness or fatigue.  States he feels like he could run around.  His son did say that he has mixed up his medications and taking morning and night pills at the same time the last two days.    Relevant past medical, surgical, family and social history reviewed and updated as indicated. Interim medical history since our last visit reviewed. Allergies and medications reviewed and updated.  Review of Systems  Constitutional:  Negative for fatigue.  Neurological:  Negative for weakness.    Per HPI unless specifically indicated above     Objective:    BP 118/64   Pulse 99   Temp 98.1 F (36.7 C) (Oral)   Ht 5' 9 (1.753 m)   Wt 155 lb 9.6 oz (70.6 kg)   SpO2 98%   BMI 22.98 kg/m   Wt Readings from Last 3 Encounters:  07/24/24 155 lb 9.6 oz (70.6 kg)  07/10/24 155 lb 12.8 oz (70.7 kg)  07/03/24 172 lb 2.9 oz (78.1 kg)    Physical Exam Vitals and nursing note reviewed.  Constitutional:      General: He is not in acute distress.    Appearance: Normal appearance. He is not ill-appearing, toxic-appearing or diaphoretic.  HENT:     Head: Normocephalic.     Right Ear: External ear normal.     Left Ear: External ear normal.     Nose: Nose normal. No congestion or rhinorrhea.     Mouth/Throat:     Mouth: Mucous membranes are moist.  Eyes:     General:        Right eye: No discharge.        Left eye: No discharge.     Extraocular Movements: Extraocular movements intact.     Conjunctiva/sclera:  Conjunctivae normal.     Pupils: Pupils are equal, round, and reactive to light.  Cardiovascular:     Rate and Rhythm: Normal rate and regular rhythm.     Heart sounds: No murmur heard. Pulmonary:     Effort: Pulmonary effort is normal. No respiratory distress.     Breath sounds: Normal breath sounds. No wheezing, rhonchi or rales.  Abdominal:     General: Abdomen is flat. Bowel sounds are normal.  Musculoskeletal:     Cervical back: Normal range of motion and neck supple.  Skin:    General: Skin is warm and dry.     Capillary Refill: Capillary refill takes less than 2 seconds.  Neurological:     General: No focal deficit present.     Mental Status: He is alert and oriented to person, place, and time.  Psychiatric:        Mood and Affect: Mood normal.        Behavior: Behavior normal.     Results for orders  placed or performed in visit on 07/10/24  Comprehensive metabolic panel with GFR   Collection Time: 07/10/24 11:01 AM  Result Value Ref Range   Glucose 145 (H) 70 - 99 mg/dL   BUN 15 10 - 36 mg/dL   Creatinine, Ser 8.74 0.76 - 1.27 mg/dL   eGFR 54 (L) >40 fO/fpw/8.26   BUN/Creatinine Ratio 12 10 - 24   Sodium 143 134 - 144 mmol/L   Potassium 4.4 3.5 - 5.2 mmol/L   Chloride 104 96 - 106 mmol/L   CO2 21 20 - 29 mmol/L   Calcium 9.7 8.6 - 10.2 mg/dL   Total Protein 7.2 6.0 - 8.5 g/dL   Albumin 4.5 3.6 - 4.6 g/dL   Globulin, Total 2.7 1.5 - 4.5 g/dL   Bilirubin Total 0.6 0.0 - 1.2 mg/dL   Alkaline Phosphatase 72 44 - 121 IU/L   AST 32 0 - 40 IU/L   ALT 28 0 - 44 IU/L      Assessment & Plan:   Problem List Items Addressed This Visit   None Visit Diagnoses       COVID-19    -  Primary   CMP checked due to Cr being slightly elevated in the hospital.  overall reports feeling well. No falls, weakness or fatigue.   Relevant Orders   Comp Met (CMET)     Medication nonadherence due to psychosocial problem       Check CMP. Patient mixed up the timing of his  medications. Vital signs are reassuring. Will check kidney function.        Follow up plan: No follow-ups on file.

## 2024-07-25 ENCOUNTER — Other Ambulatory Visit

## 2024-07-25 ENCOUNTER — Ambulatory Visit: Payer: Self-pay | Admitting: Nurse Practitioner

## 2024-07-25 DIAGNOSIS — I10 Essential (primary) hypertension: Secondary | ICD-10-CM

## 2024-07-25 LAB — COMPREHENSIVE METABOLIC PANEL WITH GFR
ALT: 19 IU/L (ref 0–44)
AST: 17 IU/L (ref 0–40)
Albumin: 4.4 g/dL (ref 3.6–4.6)
Alkaline Phosphatase: 75 IU/L (ref 48–129)
BUN/Creatinine Ratio: 11 (ref 10–24)
BUN: 22 mg/dL (ref 10–36)
Bilirubin Total: 0.4 mg/dL (ref 0.0–1.2)
CO2: 23 mmol/L (ref 20–29)
Calcium: 9.9 mg/dL (ref 8.6–10.2)
Chloride: 106 mmol/L (ref 96–106)
Creatinine, Ser: 2.06 mg/dL — ABNORMAL HIGH (ref 0.76–1.27)
Globulin, Total: 2.8 g/dL (ref 1.5–4.5)
Glucose: 120 mg/dL — ABNORMAL HIGH (ref 70–99)
Potassium: 4.6 mmol/L (ref 3.5–5.2)
Sodium: 145 mmol/L — ABNORMAL HIGH (ref 134–144)
Total Protein: 7.2 g/dL (ref 6.0–8.5)
eGFR: 30 mL/min/1.73 — ABNORMAL LOW (ref >59)

## 2024-07-26 ENCOUNTER — Ambulatory Visit: Payer: Self-pay | Admitting: Nurse Practitioner

## 2024-07-26 DIAGNOSIS — I10 Essential (primary) hypertension: Secondary | ICD-10-CM

## 2024-07-26 LAB — COMPREHENSIVE METABOLIC PANEL WITH GFR
ALT: 17 IU/L (ref 0–44)
AST: 21 IU/L (ref 0–40)
Albumin: 4.5 g/dL (ref 3.6–4.6)
Alkaline Phosphatase: 75 IU/L (ref 48–129)
BUN/Creatinine Ratio: 12 (ref 10–24)
BUN: 24 mg/dL (ref 10–36)
Bilirubin Total: 0.3 mg/dL (ref 0.0–1.2)
CO2: 21 mmol/L (ref 20–29)
Calcium: 9.7 mg/dL (ref 8.6–10.2)
Chloride: 101 mmol/L (ref 96–106)
Creatinine, Ser: 2.07 mg/dL — ABNORMAL HIGH (ref 0.76–1.27)
Globulin, Total: 2.8 g/dL (ref 1.5–4.5)
Glucose: 87 mg/dL (ref 70–99)
Potassium: 4.7 mmol/L (ref 3.5–5.2)
Sodium: 143 mmol/L (ref 134–144)
Total Protein: 7.3 g/dL (ref 6.0–8.5)
eGFR: 30 mL/min/1.73 — ABNORMAL LOW (ref >59)
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
## 2024-07-27 ENCOUNTER — Ambulatory Visit (INDEPENDENT_AMBULATORY_CARE_PROVIDER_SITE_OTHER): Payer: Self-pay | Admitting: Urology

## 2024-07-27 ENCOUNTER — Encounter: Payer: Self-pay | Admitting: Urology

## 2024-07-27 ENCOUNTER — Telehealth: Payer: Self-pay | Admitting: *Deleted

## 2024-07-27 ENCOUNTER — Other Ambulatory Visit: Payer: Self-pay | Admitting: Licensed Clinical Social Worker

## 2024-07-27 VITALS — BP 122/67 | HR 78 | Ht 69.5 in | Wt 155.0 lb

## 2024-07-27 DIAGNOSIS — R399 Unspecified symptoms and signs involving the genitourinary system: Secondary | ICD-10-CM | POA: Diagnosis not present

## 2024-07-27 LAB — BLADDER SCAN AMB NON-IMAGING
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
## 2024-07-27 NOTE — Patient Instructions (Signed)
 Visit Information  Thank you for taking time to visit with me today. Please don't hesitate to contact me if I can be of assistance to you before our next scheduled appointment.  Our next appointment is by telephone on 08/07/24 at 9:30am. Please call the care guide team at [phone] if you need to cancel or reschedule your appointment.   Following is a copy of your care plan:   Goals Addressed             This Visit's Progress    [facility] Social Work Care Plan LCSW       Problems:   Care Coordination needs related to Dementia: Alzheimer's dementia  CSW Clinical Goal(s):   Over the next 90 days the Caregiver Patient will explore community resource options for unmet needs related to dementia support as evidenced by considering enrollment in a day support program or utilizing private duty support in the home.  Interventions:  Dementia Care:   Current level of care: Home with other family or significant other(s): spouse  Evaluation of patient safety in current living environment and review of Dementia resources and support (Guide program, adult day programs, and private duty care) ADL's Assessed needs, level of care concerns, how currently meeting needs and barriers to care Discuss community support options (Guide program, adult day programs, and private duty care) Discussed private pay options for personal care needs (resources to be provided to family) PHQ2/PHQ9 completed  Patient Goals/Self-Care Activities:  Patients family to continue to consider adult day programs and private duty support  Plan:   Telephone follow up appointment with care management team member scheduled for:  08/07/24 at 9:30am.        Please call 911 if you are experiencing a Mental Health or Behavioral Health Crisis or need someone to talk to.  Patient verbalizes understanding of instructions and care plan provided today and agrees to view in MyChart. Active MyChart status and patient understanding  of how to access instructions and care plan via MyChart confirmed with patient.     Cena Ligas, LCSW Clinical Social Worker [facility]

## 2024-07-28 ENCOUNTER — Telehealth: Payer: Self-pay

## 2024-07-28 NOTE — Telephone Encounter (Signed)
 Copied from CRM [id number]. Topic: General - Other >> Jul 28, 2024  8:32 AM Myrick T wrote: Reason for CRM: Leita Glatter from [facility] called stated patient had a doctors appt and missed his OT visit

## 2024-08-01 ENCOUNTER — Other Ambulatory Visit: Payer: Self-pay | Admitting: Nurse Practitioner

## 2024-08-01 ENCOUNTER — Other Ambulatory Visit

## 2024-08-01 DIAGNOSIS — I10 Essential (primary) hypertension: Secondary | ICD-10-CM

## 2024-08-02 ENCOUNTER — Ambulatory Visit: Payer: Self-pay | Admitting: Nurse Practitioner

## 2024-08-02 LAB — COMPREHENSIVE METABOLIC PANEL WITH GFR
ALT: 19 IU/L (ref 0–44)
AST: 23 IU/L (ref 0–40)
Albumin: 4.2 g/dL (ref 3.6–4.6)
Alkaline Phosphatase: 73 IU/L (ref 48–129)
BUN/Creatinine Ratio: 12 (ref 10–24)
BUN: 19 mg/dL (ref 10–36)
Bilirubin Total: 0.5 mg/dL (ref 0.0–1.2)
CO2: 24 mmol/L (ref 20–29)
Calcium: 9.4 mg/dL (ref 8.6–10.2)
Chloride: 99 mmol/L (ref 96–106)
Creatinine, Ser: 1.55 mg/dL — ABNORMAL HIGH (ref 0.76–1.27)
Globulin, Total: 2.9 g/dL (ref 1.5–4.5)
Glucose: 86 mg/dL (ref 70–99)
Potassium: 4 mmol/L (ref 3.5–5.2)
Sodium: 139 mmol/L (ref 134–144)
Total Protein: 7.1 g/dL (ref 6.0–8.5)
eGFR: 42 mL/min/1.73 — ABNORMAL LOW (ref >59)

## 2024-08-02 NOTE — Telephone Encounter (Signed)
 Requested Prescriptions  Pending Prescriptions Disp Refills   potassium chloride  SA (KLOR-CON  M) 20 MEQ tablet [Pharmacy Med Name: POTA CL MICRO ER TAB] 360 tablet 0    Sig: Take 2 tablets by mouth twice daily     Endocrinology:  Minerals - Potassium Supplementation Failed - 08/02/2024  3:22 PM      Failed - Cr in normal range and within 360 days    Creatinine  Date Value Ref Range Status  05/07/2013 1.21 0.60 - 1.30 mg/dL Final   Creatinine, Ser  Date Value Ref Range Status  08/01/2024 1.55 (H) 0.76 - 1.27 mg/dL Final         Passed - K in normal range and within 360 days    Potassium  Date Value Ref Range Status  08/01/2024 4.0 3.5 - 5.2 mmol/L Final  05/07/2013 3.8 3.5 - 5.1 mmol/L Final         Passed - Valid encounter within last 12 months    Recent Outpatient Visits           1 week ago COVID-19   [place] Melvin Pao, NP   3 weeks ago Hematuria, unspecified type   [place] [facility] Melvin Pao, NP   2 months ago Hospital discharge follow-up   [place] [facility] Melvin Pao, NP       Future Appointments             In 5 days McGowan, Clotilda DELENA RIGGERS [facility] [place]   In 11 months McGowan, Clotilda DELENA RIGGERS [facility] [place]

## 2024-08-06 ENCOUNTER — Encounter: Payer: Self-pay | Admitting: Nurse Practitioner
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
## 2024-08-07 ENCOUNTER — Other Ambulatory Visit: Payer: Self-pay

## 2024-08-07 ENCOUNTER — Other Ambulatory Visit: Payer: Self-pay | Admitting: Licensed Clinical Social Worker

## 2024-08-07 ENCOUNTER — Encounter: Payer: Self-pay | Admitting: Licensed Clinical Social Worker

## 2024-08-07 ENCOUNTER — Ambulatory Visit: Admitting: Urology

## 2024-08-07 DIAGNOSIS — R310 Gross hematuria: Secondary | ICD-10-CM

## 2024-08-07 DIAGNOSIS — C61 Malignant neoplasm of prostate: Secondary | ICD-10-CM

## 2024-08-07 DIAGNOSIS — N138 Other obstructive and reflux uropathy: Secondary | ICD-10-CM

## 2024-08-07 DIAGNOSIS — R399 Unspecified symptoms and signs involving the genitourinary system: Secondary | ICD-10-CM

## 2024-08-07 NOTE — Patient Instructions (Signed)
 [name] - I am sorry I was unable to reach you today for our scheduled appointment. I work with Melvin Pao, NP and am calling to support your healthcare needs. Please contact me at [phone] at your earliest convenience. I look forward to speaking with you soon.   Thank you,  Cena Ligas, LCSW Clinical Social Worker [facility]
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
## 2024-08-07 NOTE — Patient Instructions (Signed)
 Visit Information  Thank you for taking time to visit with me today. Please don't hesitate to contact me if I can be of assistance to you before our next scheduled appointment.  Your next care management appointment is by telephone on October 20th at 9:30am   Please call the care guide team at [phone] if you need to cancel, schedule, or reschedule an appointment.   A reminder to ALL patients/family/friends, please call the [place]  National Suicide Prevention Lifeline: 781-777-7954 or TTY: 917 078 7803 TTY 618 386 7203) to talk to a trained counselor if you are experiencing a Mental Health or Behavioral Health Crisis or need someone to talk to.  [name] Stamp BSN, CCM [place]  [facility] Population Health RN Care Manager Direct Dial: [phone]  Fax: [phone]

## 2024-08-09 ENCOUNTER — Other Ambulatory Visit: Payer: Self-pay | Admitting: Nurse Practitioner

## 2024-08-10 DIAGNOSIS — F02818 Dementia in other diseases classified elsewhere, unspecified severity, with other behavioral disturbance: Secondary | ICD-10-CM | POA: Diagnosis not present

## 2024-08-10 DIAGNOSIS — Z9181 History of falling: Secondary | ICD-10-CM

## 2024-08-10 DIAGNOSIS — N179 Acute kidney failure, unspecified: Secondary | ICD-10-CM

## 2024-08-10 DIAGNOSIS — R531 Weakness: Secondary | ICD-10-CM

## 2024-08-10 DIAGNOSIS — R296 Repeated falls: Secondary | ICD-10-CM | POA: Diagnosis not present

## 2024-08-10 DIAGNOSIS — R319 Hematuria, unspecified: Secondary | ICD-10-CM | POA: Diagnosis not present

## 2024-08-10 DIAGNOSIS — G309 Alzheimer's disease, unspecified: Secondary | ICD-10-CM | POA: Diagnosis not present

## 2024-08-10 DIAGNOSIS — I10 Essential (primary) hypertension: Secondary | ICD-10-CM

## 2024-08-10 DIAGNOSIS — N400 Enlarged prostate without lower urinary tract symptoms: Secondary | ICD-10-CM

## 2024-08-10 DIAGNOSIS — R350 Frequency of micturition: Secondary | ICD-10-CM

## 2024-08-10 NOTE — Telephone Encounter (Signed)
 Requested Prescriptions  Pending Prescriptions Disp Refills   mirtazapine  (REMERON ) 7.5 MG tablet [Pharmacy Med Name: Mirtazapine  7.5 MG Oral Tablet] 90 tablet 0    Sig: TAKE 1 TABLET BY MOUTH AT BEDTIME     Psychiatry: Antidepressants - mirtazapine  Passed - 08/10/2024  3:49 PM      Passed - Valid encounter within last 6 months    Recent Outpatient Visits           2 weeks ago COVID-19   [place] [facility] Melvin Pao, NP   1 month ago Hematuria, unspecified type   [place] [facility] Melvin Pao, NP   2 months ago Hospital discharge follow-up   [place] [facility] Melvin Pao, NP       Future Appointments             In 11 months [place], Clotilda DELENA RIGGERS [facility] [place]

## 2024-08-14 ENCOUNTER — Ambulatory Visit: Admitting: Podiatry

## 2024-08-14 DIAGNOSIS — M79675 Pain in left toe(s): Secondary | ICD-10-CM

## 2024-08-14 DIAGNOSIS — B351 Tinea unguium: Secondary | ICD-10-CM | POA: Diagnosis not present

## 2024-08-14 DIAGNOSIS — G6289 Other specified polyneuropathies: Secondary | ICD-10-CM

## 2024-08-14 DIAGNOSIS — M79674 Pain in right toe(s): Secondary | ICD-10-CM

## 2024-08-15 ENCOUNTER — Telehealth: Payer: Self-pay | Admitting: Nurse Practitioner

## 2024-08-15 NOTE — Telephone Encounter (Signed)
 Patient's son Jamere Stidham came in and dropped off FMLA paperwork to be completed by the provider. Paperwork will be placed in the providers folder.

## 2024-08-17 NOTE — Telephone Encounter (Signed)
 Paperwork completed and signed by Darice. Original placed in bin for pick up and copy placed in scan bin.   Called and notified patient's son that paperwork was ready to be picked up.

## 2024-08-20 ENCOUNTER — Encounter: Payer: Self-pay | Admitting: Podiatry

## 2024-08-20 NOTE — Progress Notes (Signed)
  Subjective:  Patient ID: Ian Parker, male    DOB: 11-19-1930,  MRN: [id number]  Ian Parker presents to clinic today for at risk foot care with history of peripheral neuropathy and painful mycotic toenails of both feet that are difficult to trim. Pain interferes with daily activities and wearing enclosed shoe gear comfortably. Patient has h/o Alzheimer's and is accompanied by his son on today's visit. Chief Complaint  Patient presents with   Toe Pain    Dr. Melvin is his PCP. His last visit was in Sept 2025. He said he is pre-diabetic and unsure of his A1c    New problem(s): None.   PCP is Melvin Pao, NP.  Allergies  Allergen Reactions   Ace Inhibitors    Peanuts [Peanut Oil]    Penicillins    Sulfa Antibiotics     Review of Systems: Negative except as noted in the HPI.  Objective: No changes noted in today's physical examination. There were no vitals filed for this visit. Ian Parker is a pleasant 89 y.o. male WD, WN in NAD. AAO x 3.  Vascular Examination: CFT <3 seconds b/l. DP/PT pulses faintly palpable b/l. Pedal edema absent. Skin temperature gradient warm to warm b/l. Digital hair absent. No pain with calf compression. No ischemia or gangrene. No cyanosis or clubbing noted b/l. Trace edema noted BLE.   Neurological Examination: Sensation grossly intact b/l with 10 gram monofilament. Vibratory sensation intact b/l. Pt has subjective symptoms of neuropathy.  Dermatological Examination: Pedal skin warm and supple b/l.   No open wounds. No interdigital macerations.  Toenails 1-5 b/l thick, discolored, elongated with subungual debris and pain on dorsal palpation.    No hyperkeratotic nor porokeratotic lesions.  No corns, calluses, nor porokeratotic lesions.  Pedal skin is warm and supple b/l LE. No open wounds b/l LE. No interdigital macerations noted b/l LE. Toenails 1-5 bilaterally minimally elongated, discolored, dystrophic, thickened, and crumbly  with subungual debris and tenderness to dorsal palpation.  Musculoskeletal Examination: Muscle strength 5/5 to all lower extremity muscle groups bilaterally. HAV with bunion deformity noted b/l LE.SABRA No pain, crepitus or joint limitation noted with ROM b/l LE.  Patient ambulates independently without assistive aids. HAV with bunion deformity noted b/l LE.  Radiographs: None  Last A1c:      Latest Ref Rng & Units 09/02/2023   11:25 AM  Hemoglobin A1C  Hemoglobin-A1c 4.8 - 5.6 % 6.3    Assessment/Plan: 1. Pain due to onychomycosis of toenails of both feet   2. Other polyneuropathy   -Patient's family member present. All questions/concerns addressed on today's visit. -Toenails 1-5 b/l were debrided in length and girth with sterile nail nippers and dremel without iatrogenic bleeding.  -Patient/POA to call should there be question/concern in the interim.   Return in about 5 months (around 01/12/2025).  Ian Parker, DPM      [place] LOCATION: [address] [place] [address]                   Office [phone]   [facility] LOCATION: [address] [place], [place] [address] Office [phone]

## 2024-08-21 ENCOUNTER — Telehealth: Payer: Self-pay

## 2024-08-23 ENCOUNTER — Other Ambulatory Visit: Payer: Self-pay
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
## 2024-08-24 ENCOUNTER — Other Ambulatory Visit: Payer: Self-pay

## 2024-08-24 ENCOUNTER — Other Ambulatory Visit: Payer: Self-pay | Admitting: Licensed Clinical Social Worker

## 2024-08-24 ENCOUNTER — Telehealth: Payer: Self-pay

## 2024-08-24 MED ORDER — RISPERIDONE 0.5 MG PO TABS: 0.5000 mg | ORAL_TABLET | Freq: Every day | ORAL | 0 refills | Status: DC
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
## 2024-08-24 NOTE — Patient Instructions (Signed)
 Visit Information  Thank you for taking time to visit with me today. Please don't hesitate to contact me if I can be of assistance to you before our next scheduled appointment.  Your next care management appointment is by telephone on Thursday, November 6th at 10:30am.  Please call the care guide team at [phone] if you need to cancel, schedule, or reschedule an appointment.   A reminder to ALL patients/family/friends, please call the [place]  National Suicide Prevention Lifeline: 845-367-9473 or TTY: (504) 693-8020 TTY 773 404 8149) to talk to a trained counselor if you are experiencing a Mental Health or Behavioral Health Crisis or need someone to talk to.  [name] Stamp BSN, CCM [place]  [facility] Population [facility] RN Care Manager Direct Dial: [phone]  Fax: [phone]

## 2024-08-24 NOTE — Telephone Encounter (Signed)
 Please find out how long patient has been taking the medication.  I was under the impression from a recent mychart message that the medication was stopped and recently restarted.

## 2024-08-24 NOTE — Patient Outreach (Signed)
 Contacted [facility], spoke with staff member, Mia, left message for Darice Holdsworth/NP that daughter, [name] is requesting guidance on increasing risperidone due to patient's multiple anger outbursts, and a referral for Palliative Care.

## 2024-08-24 NOTE — Patient Instructions (Signed)
 Visit Information  Thank you for taking time to visit with me today. Please don't hesitate to contact me if I can be of assistance to you before our next scheduled appointment.  Our next appointment is by telephone on 09/11/24 at 10am. Please call the care guide team at [phone] if you need to cancel or reschedule your appointment.   Following is a copy of your care plan:   Goals Addressed             This Visit's Progress    [facility]BC[facility] Social Work Care Plan LCSW       Problems:   Care Coordination needs related to Dementia: Alzheimer's dementia  CSW Clinical Goal(s):   Over the next 90 days the Caregiver Patient will explore community resource options for unmet needs related to dementia support as evidenced by considering enrollment in a day support program or utilizing private duty support in the home. Patient lives at home with his wife. Patients children are available to help periodically throughout the day.   Interventions:  Dementia Care:   Current level of care: Home with other family or significant other(s): spouse  Evaluation of patient safety in current living environment and review of Dementia resources and support (Guide program, adult day programs, and private duty care) ADL's Assessed needs, level of care concerns, how currently meeting needs and barriers to care Discuss community support options (Guide program, adult day programs, and private duty care) Discussed private pay options for personal care needs (resources to be provided to family) PHQ2/PHQ9 completed  Patient Goals/Self-Care Activities:  Patients family to continue to consider adult day programs and private duty support Patients family will complete intake assessment for GUIDE program with [facility].   Plan:   Telephone follow up appointment with care management team member scheduled for:  09/1024 at 10am.        Please call 911 if you are experiencing a Mental Health or Behavioral Health  Crisis or need someone to talk to.  Health Care Power of Gabriella Fine Luster verbalized understanding of Care plan and visit instructions communicated this visit  Cena Ligas, LCSW Clinical Social Worker [facility]

## 2024-08-24 NOTE — Telephone Encounter (Signed)
 Called and spoke to patient's daughter, Turkey. She states that the patient has been out of the Risperidone for about 3 to 4 weeks now.   Spoke to Wheeler verbally. Per Darice, she will send in a refill and not increase the medication at this time since he has been off of it. Darice would like for the patient to be back on the medication regularly.   Relayed the above information to the patient's daughter. Daughter verbalized understanding and confirmed pharmacy.

## 2024-08-24 NOTE — Telephone Encounter (Signed)
 Copied from CRM [id number]. Topic: Clinical - Medication Question >> Aug 24, 2024  8:50 AM Mia F wrote: Reason for CRM: [name] (a case manager with [facility] ) Is calling on behalf of pt daughter Ian Parker. Turkey would like to know if pt can increase his risperiDONE (RISPERDAL) 0.5 MG tablet as he is experiencing more anger outbursts. She also would like to see about getting a palliative care consult.

## 2024-08-30 ENCOUNTER — Other Ambulatory Visit (HOSPITAL_COMMUNITY): Payer: Self-pay

## 2024-08-30 ENCOUNTER — Telehealth: Payer: Self-pay

## 2024-08-30 NOTE — Telephone Encounter (Signed)
 Pharmacy Patient Advocate Encounter  Received notification from [employer] that Prior Authorization for Risperidone 0.5mg  tabs has been APPROVED from 11/03/2023 to 11/01/2025   PA #/Case ID/Reference #: [id number]

## 2024-08-30 NOTE — Telephone Encounter (Signed)
 Pharmacy Patient Advocate Encounter   Received notification from Onbase that prior authorization for risperiDONE 0.5MG  tablets is required/requested.   Insurance verification completed.   The patient is insured through [employer].   Per test claim: PA required; PA submitted to above mentioned insurance via Latent Key/confirmation #/EOC [id number] Status is pending

## 2024-08-31 ENCOUNTER — Other Ambulatory Visit: Payer: Self-pay | Admitting: Nurse Practitioner

## 2024-09-01 NOTE — Telephone Encounter (Signed)
 Requested Prescriptions  Pending Prescriptions Disp Refills   memantine  (NAMENDA ) 10 MG tablet [Pharmacy Med Name: Memantine  HCl 10 MG Oral Tablet] 180 tablet 0    Sig: Take 1 tablet by mouth twice daily     Neurology:  Alzheimer's Agents 2 Failed - 09/01/2024  3:53 PM      Failed - Cr in normal range and within 360 days    Creatinine  Date Value Ref Range Status  05/07/2013 1.21 0.60 - 1.30 mg/dL Final   Creatinine, Ser  Date Value Ref Range Status  08/01/2024 1.55 (H) 0.76 - 1.27 mg/dL Final         Passed - eGFR is 5 or above and within 360 days    EGFR (African American)  Date Value Ref Range Status  05/07/2013 >60  Final   GFR calc Af Amer  Date Value Ref Range Status  08/08/2019 54 (L) >60 mL/min Final   EGFR (Non-African Amer.)  Date Value Ref Range Status  05/07/2013 56 (L)  Final    Comment:    eGFR values <71mL/min/1.73 m2 may be an indication of chronic kidney disease (CKD). Calculated eGFR is useful in patients with stable renal function. The eGFR calculation will not be reliable in acutely ill patients when serum creatinine is changing rapidly. It is not useful in  patients on dialysis. The eGFR calculation may not be applicable to patients at the low and high extremes of body sizes, pregnant women, and vegetarians.    GFR, Estimated  Date Value Ref Range Status  07/03/2024 54 (L) >60 mL/min Final    Comment:    (NOTE) Calculated using the CKD-EPI Creatinine Equation (2021)    eGFR  Date Value Ref Range Status  08/01/2024 42 (L) >59 mL/min/1.73 Final         Passed - Valid encounter within last 6 months    Recent Outpatient Visits           1 month ago COVID-19   [facility] Melvin Pao, NP   1 month ago Hematuria, unspecified type   [place] [facility] [facility] Melvin Pao, NP   3 months ago Hospital discharge follow-up   [place] [facility] Melvin Pao, NP       Future  Appointments             In 10 months McGowan, Clotilda DELENA RIGGERS [facility] [place]

## 2024-09-04 ENCOUNTER — Other Ambulatory Visit (HOSPITAL_COMMUNITY): Payer: Self-pay

## 2024-09-07 ENCOUNTER — Other Ambulatory Visit: Payer: Self-pay

## 2024-09-07 NOTE — Patient Instructions (Signed)
 Visit Information  Thank you for taking time to visit with me today. Please don't hesitate to contact me if I can be of assistance to you before our next scheduled appointment.  Your next care management appointment is by telephone on Thursday, December 4th at 10:30am.  Please call the care guide team at [phone] if you need to cancel, schedule, or reschedule an appointment.   A reminder to ALL patients/family/friends, please call the [place]  National Suicide Prevention Lifeline: (505) 687-9273 or TTY: 2481545453 TTY 610-164-5331) to talk to a trained counselor if you are experiencing a Mental Health or Behavioral Health Crisis or need someone to talk to.  [name] Stamp BSN, CCM [place]  [facility] Population Health RN Care Manager Direct Dial: [phone]  Fax: [phone]
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
## 2024-09-11 ENCOUNTER — Other Ambulatory Visit: Payer: Self-pay | Admitting: Licensed Clinical Social Worker
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
## 2024-09-11 NOTE — Patient Instructions (Signed)
 Visit Information  Thank you for taking time to visit with me today. Please don't hesitate to contact me if I can be of assistance to you in the future.  Closing From: social work.  Please call the care guide team at [phone] if you need to cancel, schedule, or reschedule an appointment.   Please call 911 if you are experiencing a Mental Health or Behavioral Health Crisis or need someone to talk to.  Cena Ligas, LCSW Clinical Social Worker [facility] Population [facility]

## 2024-09-18 ENCOUNTER — Ambulatory Visit: Admitting: Podiatry

## 2024-10-05 ENCOUNTER — Other Ambulatory Visit: Payer: Self-pay

## 2024-10-05 NOTE — Patient Instructions (Signed)
 Visit Information  Thank you for taking time to visit with me today. Please don't hesitate to contact me if I can be of assistance to you before our next scheduled appointment.  Your next care management appointment is by telephone on Friday, January 4th at 10:30am.   Please call the care guide team at [phone] if you need to cancel, schedule, or reschedule an appointment.   Please call the [place]  National Suicide Prevention Lifeline: 256 323 0320 or TTY: (304)868-1920 TTY (332)743-9827) to talk to a trained counselor if you are experiencing a Mental Health or Behavioral Health Crisis or need someone to talk to.  [name] Stamp BSN, CCM [place]  [facility] Population Health RN Care Manager Direct Dial: [phone]  Fax: [phone]
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
## 2024-10-10 ENCOUNTER — Encounter: Payer: Self-pay | Admitting: Nurse Practitioner

## 2024-10-10 ENCOUNTER — Ambulatory Visit: Admitting: Nurse Practitioner

## 2024-10-10 VITALS — BP 128/73 | HR 82 | Temp 98.0°F | Ht 69.49 in | Wt 161.8 lb

## 2024-10-10 DIAGNOSIS — G6289 Other specified polyneuropathies: Secondary | ICD-10-CM

## 2024-10-10 DIAGNOSIS — I10 Essential (primary) hypertension: Secondary | ICD-10-CM | POA: Diagnosis not present

## 2024-10-10 DIAGNOSIS — R7303 Prediabetes: Secondary | ICD-10-CM

## 2024-10-10 DIAGNOSIS — Z0000 Encounter for general adult medical examination without abnormal findings: Secondary | ICD-10-CM

## 2024-10-10 DIAGNOSIS — F02818 Dementia in other diseases classified elsewhere, unspecified severity, with other behavioral disturbance: Secondary | ICD-10-CM

## 2024-10-10 DIAGNOSIS — C61 Malignant neoplasm of prostate: Secondary | ICD-10-CM

## 2024-10-10 DIAGNOSIS — D518 Other vitamin B12 deficiency anemias: Secondary | ICD-10-CM

## 2024-10-10 MED ORDER — GALANTAMINE HYDROBROMIDE ER 8 MG PO CP24: 8.0000 mg | ORAL_CAPSULE | Freq: Every day | ORAL | 1 refills | Status: AC

## 2024-10-10 MED ORDER — POTASSIUM CHLORIDE CRYS ER 20 MEQ PO TBCR: 40.0000 meq | EXTENDED_RELEASE_TABLET | Freq: Two times a day (BID) | ORAL | 1 refills | Status: AC

## 2024-10-10 MED ORDER — MEMANTINE HCL 10 MG PO TABS: 10.0000 mg | ORAL_TABLET | Freq: Two times a day (BID) | ORAL | 1 refills | Status: AC

## 2024-10-10 MED ORDER — AMLODIPINE BESYLATE 10 MG PO TABS: 10.0000 mg | ORAL_TABLET | Freq: Every day | ORAL | 1 refills | Status: AC

## 2024-10-10 MED ORDER — TRIAMTERENE-HCTZ 75-50 MG PO TABS: 1.0000 | ORAL_TABLET | Freq: Every day | ORAL | 1 refills | Status: AC

## 2024-10-10 MED ORDER — RISPERIDONE 0.5 MG PO TABS: 0.5000 mg | ORAL_TABLET | Freq: Every day | ORAL | 1 refills | Status: AC

## 2024-10-10 MED ORDER — RISPERIDONE 0.25 MG PO TABS: 0.2500 mg | ORAL_TABLET | Freq: Every day | ORAL | 1 refills | Status: AC

## 2024-10-10 NOTE — Progress Notes (Signed)
 BP 128/73 (BP Location: Right Arm, Cuff Size: Normal)   Pulse 82   Temp 98 F (36.7 C) (Oral)   Ht 5' 9.49 (1.765 m)   Wt 161 lb 12.8 oz (73.4 kg)   SpO2 99%   BMI 23.56 kg/m    Subjective:    Patient ID: Ian Parker, male    DOB: [birth year], 89 y.o.   MRN: [id number]  HPI: Ian Parker is a 89 y.o. male  Chief Complaint  Patient presents with   Hypertension    3 month F/u. Patients daughter stated he is doing better with his currents meds.   Hyperlipidemia   HYPERTENSION Hypertension status: controlled  Satisfied with current treatment? yes Duration of hypertension: years BP monitoring frequency:  not checking BP range:  BP medication side effects:  no Medication compliance: excellent compliance Previous BP meds: amlodipine  and Maxide Aspirin : no Recurrent headaches: no Visual changes: no Palpitations: no Dyspnea: no Chest pain: no Lower extremity edema: no Dizzy/lightheaded: no  MEMORY Patient's daughter has  accompanied patient today.  States he his behavioral concerns have improved.  He does get worked up some but they are able to redirect him easier.  He is also taking the Mirtazipine with the Risperidone .  Sleeping well at night.    Patient's children have noticed that his toenails are long and thick and would like to have them cut tended to.     Relevant past medical, surgical, family and social history reviewed and updated as indicated. Interim medical history since our last visit reviewed. Allergies and medications reviewed and updated.  Review of Systems  Eyes:  Negative for visual disturbance.  Respiratory:  Negative for shortness of breath.   Cardiovascular:  Negative for chest pain and leg swelling.  Skin:        Thick toenails  Neurological:  Negative for light-headedness and headaches.       Memory decline  Psychiatric/Behavioral:  Positive for behavioral problems.     Per HPI unless specifically indicated above     Objective:     BP 128/73 (BP Location: Right Arm, Cuff Size: Normal)   Pulse 82   Temp 98 F (36.7 C) (Oral)   Ht 5' 9.49 (1.765 m)   Wt 161 lb 12.8 oz (73.4 kg)   SpO2 99%   BMI 23.56 kg/m   Wt Readings from Last 3 Encounters:  10/10/24 161 lb 12.8 oz (73.4 kg)  07/27/24 155 lb (70.3 kg)  07/24/24 155 lb 9.6 oz (70.6 kg)    Physical Exam Vitals and nursing note reviewed.  Constitutional:      General: He is not in acute distress.    Appearance: Normal appearance. He is not ill-appearing, toxic-appearing or diaphoretic.  HENT:     Head: Normocephalic.     Right Ear: External ear normal.     Left Ear: External ear normal.     Nose: Nose normal. No congestion or rhinorrhea.     Mouth/Throat:     Mouth: Mucous membranes are moist.  Eyes:     General:        Right eye: No discharge.        Left eye: No discharge.     Extraocular Movements: Extraocular movements intact.     Conjunctiva/sclera: Conjunctivae normal.     Pupils: Pupils are equal, round, and reactive to light.  Cardiovascular:     Rate and Rhythm: Normal rate and regular rhythm.     Heart sounds: No  murmur heard. Pulmonary:     Effort: Pulmonary effort is normal. No respiratory distress.     Breath sounds: Normal breath sounds. No wheezing, rhonchi or rales.  Abdominal:     General: Abdomen is flat. Bowel sounds are normal.  Musculoskeletal:     Cervical back: Normal range of motion and neck supple.  Skin:    General: Skin is warm and dry.     Capillary Refill: Capillary refill takes less than 2 seconds.  Neurological:     General: No focal deficit present.     Mental Status: He is alert and oriented to person, place, and time.  Psychiatric:        Mood and Affect: Mood normal.        Behavior: Behavior normal.     Results for orders placed or performed in visit on 08/01/24  Comp Met (CMET)   Collection Time: 08/01/24  4:32 PM  Result Value Ref Range   Glucose 86 70 - 99 mg/dL   BUN 19 10 - 36 mg/dL    Creatinine, Ser 8.44 (H) 0.76 - 1.27 mg/dL   eGFR 42 (L) >40 fO/fpw/8.26   BUN/Creatinine Ratio 12 10 - 24   Sodium 139 134 - 144 mmol/L   Potassium 4.0 3.5 - 5.2 mmol/L   Chloride 99 96 - 106 mmol/L   CO2 24 20 - 29 mmol/L   Calcium 9.4 8.6 - 10.2 mg/dL   Total Protein 7.1 6.0 - 8.5 g/dL   Albumin 4.2 3.6 - 4.6 g/dL   Globulin, Total 2.9 1.5 - 4.5 g/dL   Bilirubin Total 0.5 0.0 - 1.2 mg/dL   Alkaline Phosphatase 73 48 - 129 IU/L   AST 23 0 - 40 IU/L   ALT 19 0 - 44 IU/L      Assessment & Plan:   Problem List Items Addressed This Visit       Cardiovascular and Mediastinum   HTN (hypertension)   Chronic.  Controlled.  Continue with current medication regimen on Amlodipine  10mg  and Maxide 75/50mg  daily.  Recommend checking blood pressures at home and bringing log to next visit.  Labs ordered today.  Return to clinic in 3 months for reevaluation.  Call sooner if concerns arise.       Relevant Medications   amLODipine  (NORVASC ) 10 MG tablet   triamterene -hydrochlorothiazide  (MAXZIDE ) 75-50 MG tablet   Other Relevant Orders   Comp Met (CMET)     Nervous and Auditory   Peripheral neuropathy (HCC)   Chronic.  Controlled.  Continue with current medication regimen.  Labs ordered today.  Return to clinic in 3 months for reevaluation.  B12 levels rechecked today. Call sooner if concerns arise.       Relevant Medications   galantamine  (RAZADYNE  ER) 8 MG 24 hr capsule   memantine  (NAMENDA ) 10 MG tablet   risperiDONE  (RISPERDAL ) 0.5 MG tablet   risperiDONE  (RISPERDAL ) 0.25 MG tablet   Alzheimer's dementia with behavioral disturbance (HCC)   Chronic. Improved.  Will stop mirtazipine and increase Risperadone to 0.75mg .  Discussed low increase due to risk of increased falls and drowsiness.  Follow up in 3 months.  Call sooner if concerns arise.       Relevant Medications   galantamine  (RAZADYNE  ER) 8 MG 24 hr capsule   memantine  (NAMENDA ) 10 MG tablet   risperiDONE  (RISPERDAL ) 0.5 MG  tablet   risperiDONE  (RISPERDAL ) 0.25 MG tablet     Genitourinary   Prostate cancer (HCC) - Primary  Followed by Urology.  Well controlled.         Other   Anemia due to vitamin B12 deficiency   Chronic.  Controlled.  Continue with current medication regimen.  Labs ordered today.  Return to clinic in 6 months for reevaluation.  Call sooner if concerns arise.        Relevant Orders   CBC w/Diff   B12   Prediabetes   Chronic.  Controlled.  Continue with current medication regimen.  Labs ordered today.  Return to clinic in 6 months for reevaluation.  Call sooner if concerns arise.        Relevant Orders   Hemoglobin A1c   HgB A1c   Other Visit Diagnoses       Encounter for Medicare annual wellness exam             Follow up plan: Return in about 3 months (around 01/08/2025) for HTN, HLD, DM2 FU.

## 2024-10-10 NOTE — Assessment & Plan Note (Signed)
 Chronic.  Controlled.  Continue with current medication regimen.  Labs ordered today.  Return to clinic in 6 months for reevaluation.  Call sooner if concerns arise.  ? ?

## 2024-10-10 NOTE — Assessment & Plan Note (Signed)
 Chronic. Improved.  Will stop mirtazipine and increase Risperadone to 0.75mg .  Discussed low increase due to risk of increased falls and drowsiness.  Follow up in 3 months.  Call sooner if concerns arise.

## 2024-10-10 NOTE — Assessment & Plan Note (Signed)
 Followed by Urology.  Well controlled.

## 2024-10-10 NOTE — Assessment & Plan Note (Signed)
 Chronic.  Controlled.  Continue with current medication regimen.  Labs ordered today.  Return to clinic in 3 months for reevaluation.  B12 levels rechecked today. Call sooner if concerns arise.

## 2024-10-10 NOTE — Assessment & Plan Note (Signed)
 Chronic.  Controlled.  Continue with current medication regimen on Amlodipine  10mg  and Maxide 75/50mg  daily.  Recommend checking blood pressures at home and bringing log to next visit.  Labs ordered today.  Return to clinic in 3 months for reevaluation.  Call sooner if concerns arise.
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
## 2024-10-11 ENCOUNTER — Ambulatory Visit: Payer: Self-pay | Admitting: Nurse Practitioner

## 2024-10-11 DIAGNOSIS — N1831 Chronic kidney disease, stage 3a: Secondary | ICD-10-CM | POA: Insufficient documentation

## 2024-10-11 LAB — COMPREHENSIVE METABOLIC PANEL WITH GFR
ALT: 16 IU/L (ref 0–44)
AST: 24 IU/L (ref 0–40)
Albumin: 4.4 g/dL (ref 3.6–4.6)
Alkaline Phosphatase: 65 IU/L (ref 48–129)
BUN/Creatinine Ratio: 10 (ref 10–24)
BUN: 13 mg/dL (ref 10–36)
Bilirubin Total: 0.4 mg/dL (ref 0.0–1.2)
CO2: 25 mmol/L (ref 20–29)
Calcium: 9.6 mg/dL (ref 8.6–10.2)
Chloride: 104 mmol/L (ref 96–106)
Creatinine, Ser: 1.32 mg/dL — ABNORMAL HIGH (ref 0.76–1.27)
Globulin, Total: 2.4 g/dL (ref 1.5–4.5)
Glucose: 77 mg/dL (ref 70–99)
Potassium: 4.7 mmol/L (ref 3.5–5.2)
Sodium: 145 mmol/L — ABNORMAL HIGH (ref 134–144)
Total Protein: 6.8 g/dL (ref 6.0–8.5)
eGFR: 51 mL/min/1.73 — ABNORMAL LOW (ref >59)

## 2024-10-11 LAB — CBC WITH DIFFERENTIAL/PLATELET
Basophils Absolute: 0 x10E3/uL (ref 0.0–0.2)
Basos: 0 %
EOS (ABSOLUTE): 0.2 x10E3/uL (ref 0.0–0.4)
Eos: 3 %
Hematocrit: 45 % (ref 37.5–51.0)
Hemoglobin: 14.1 g/dL (ref 13.0–17.7)
Immature Grans (Abs): 0 x10E3/uL (ref 0.0–0.1)
Immature Granulocytes: 0 %
Lymphocytes Absolute: 1.6 x10E3/uL (ref 0.7–3.1)
Lymphs: 34 %
MCH: 25.9 pg — ABNORMAL LOW (ref 26.6–33.0)
MCHC: 31.3 g/dL — ABNORMAL LOW (ref 31.5–35.7)
MCV: 83 fL (ref 79–97)
Monocytes Absolute: 0.4 x10E3/uL (ref 0.1–0.9)
Monocytes: 9 %
Neutrophils Absolute: 2.6 x10E3/uL (ref 1.4–7.0)
Neutrophils: 54 %
Platelets: 177 x10E3/uL (ref 150–450)
RBC: 5.45 x10E6/uL (ref 4.14–5.80)
RDW: 15 % (ref 11.6–15.4)
WBC: 4.8 x10E3/uL (ref 3.4–10.8)

## 2024-10-11 LAB — HEMOGLOBIN A1C
Est. average glucose Bld gHb Est-mCnc: 117 mg/dL
Hgb A1c MFr Bld: 5.7 % — ABNORMAL HIGH (ref 4.8–5.6)

## 2024-10-11 LAB — VITAMIN B12: Vitamin B-12: 1670 pg/mL — ABNORMAL HIGH (ref 232–1245)

## 2024-11-03 ENCOUNTER — Other Ambulatory Visit: Payer: Self-pay

## 2024-11-03 NOTE — Patient Instructions (Signed)
 Visit Information  Thank you for taking time to visit with me today. Please don't hesitate to contact me if I can be of assistance to you before our next scheduled appointment.  Your next care management appointment is by telephone on Friday, January 30th at 10:30am.  Please call the care guide team at [phone] if you need to cancel, schedule, or reschedule an appointment.   Please call the [place]  National Suicide Prevention Lifeline: 580-625-1153 or TTY: 5180234958 TTY (530) 260-4787) to talk to a trained counselor if you are experiencing a Mental Health or Behavioral Health Crisis or need someone to talk to.  [name] Stamp BSN, CCM [place]  VBCI Population Health RN Care Manager Direct Dial: [phone]  Fax: [phone]
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
## 2024-11-04 ENCOUNTER — Other Ambulatory Visit: Payer: Self-pay | Admitting: Nurse Practitioner

## 2024-11-06 NOTE — Telephone Encounter (Signed)
 Discontinued 10/10/24.  Requested Prescriptions  Pending Prescriptions Disp Refills   mirtazapine  (REMERON ) 7.5 MG tablet [Pharmacy Med Name: Mirtazapine  7.5 MG Oral Tablet] 90 tablet 0    Sig: TAKE 1 TABLET BY MOUTH AT BEDTIME     Psychiatry: Antidepressants - mirtazapine  Passed - 11/06/2024  1:48 PM      Passed - Valid encounter within last 6 months    Recent Outpatient Visits           3 weeks ago Prostate cancer [facility])   [place] [facility] Melvin Pao, NP   3 months ago COVID-19   [facility] Melvin Pao, NP   3 months ago Hematuria, unspecified type   [facility] Melvin Pao, NP   5 months ago Hospital discharge follow-up   [place] [facility] Melvin Pao, NP       Future Appointments             In 8 months McGowan, Clotilda DELENA RIGGERS [facility] [place]

## 2024-11-10 ENCOUNTER — Telehealth: Payer: Self-pay | Admitting: Licensed Clinical Social Worker

## 2024-11-10 NOTE — Patient Outreach (Signed)
 LCSW received a call from patients daughter [name]. Vicky inquired about home repair services. LCSW encouraged Vicky to reach out to her [facility] child psychotherapist with the guide program about the ageing gracefully program. LCSW also gave Andres information about the city of [employer] program and for the [employer] planning department grant for seniors. Vicky stated she will follow up with these resources and thanked LCSW for the information.   Cena Ligas, LCSW Clinical Social Worker [facility]

## 2024-12-01 ENCOUNTER — Other Ambulatory Visit: Payer: Self-pay

## 2024-12-01 NOTE — Patient Instructions (Signed)
 Visit Information  Thank you for taking time to visit with me today. Please don't hesitate to contact me if I can be of assistance to you before our next scheduled appointment.  Your next care management appointment is by telephone on Thursday, February 12th at 10:30am.  Please call the care guide team at [phone] if you need to cancel, schedule, or reschedule an appointment.   Please call the [place]  National Suicide Prevention Lifeline: 8670543714 or TTY: 515-083-5139 TTY [phone]) to talk to a trained counselor if you are experiencing a Mental Health or Behavioral Health Crisis or need someone to talk to.  [name] Stamp BSN, CCM [place]  [facility] RN Care Manager Direct Dial: [phone]  Fax: [phone]
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
## 2024-12-04 ENCOUNTER — Telehealth: Payer: Self-pay

## 2024-12-04 NOTE — Patient Instructions (Signed)
 Visit Information  Thank you for taking time to visit with me today. Please don't hesitate to contact me if I can be of assistance to you before our next scheduled appointment.  Your next care management appointment is by telephone on Monday, March 2nd at 10:30am.  Please call the care guide team at [phone] if you need to cancel, schedule, or reschedule an appointment.   Please call the [place]  National Suicide Prevention Lifeline: 703 480 4114 or TTY: (512)626-3150 TTY [phone]) to talk to a trained counselor if you are experiencing a Mental Health or Behavioral Health Crisis or need someone to talk to.  [name] Stamp BSN, CCM [place]  [facility] RN Care Manager Direct Dial: [phone]  Fax: [phone]

## 2024-12-14 ENCOUNTER — Telehealth

## 2025-01-01 ENCOUNTER — Telehealth

## 2025-01-08 ENCOUNTER — Ambulatory Visit: Admitting: Nurse Practitioner

## 2025-01-11 ENCOUNTER — Ambulatory Visit: Admitting: Podiatry

## 2025-07-26 ENCOUNTER — Ambulatory Visit: Admitting: Urology
